# Patient Record
Sex: Male | Born: 1935 | Race: White | Hispanic: No | Marital: Married | Smoking: Former smoker
Health system: Southern US, Community
[De-identification: ages and names within clinical notes are randomized; demographics above are authoritative.]

## PROBLEM LIST (undated history)

## (undated) ENCOUNTER — Emergency Department (HOSPITAL_COMMUNITY): Admission: EM | Payer: Medicare Other | Source: Home / Self Care

## (undated) DIAGNOSIS — K219 Gastro-esophageal reflux disease without esophagitis: Secondary | ICD-10-CM

## (undated) DIAGNOSIS — I499 Cardiac arrhythmia, unspecified: Secondary | ICD-10-CM

## (undated) DIAGNOSIS — R002 Palpitations: Secondary | ICD-10-CM

## (undated) DIAGNOSIS — F329 Major depressive disorder, single episode, unspecified: Secondary | ICD-10-CM

## (undated) DIAGNOSIS — F419 Anxiety disorder, unspecified: Secondary | ICD-10-CM

## (undated) DIAGNOSIS — M1990 Unspecified osteoarthritis, unspecified site: Secondary | ICD-10-CM

## (undated) DIAGNOSIS — I4891 Unspecified atrial fibrillation: Secondary | ICD-10-CM

## (undated) DIAGNOSIS — E669 Obesity, unspecified: Secondary | ICD-10-CM

## (undated) DIAGNOSIS — F32A Depression, unspecified: Secondary | ICD-10-CM

## (undated) DIAGNOSIS — M2550 Pain in unspecified joint: Secondary | ICD-10-CM

## (undated) DIAGNOSIS — I10 Essential (primary) hypertension: Secondary | ICD-10-CM

## (undated) HISTORY — PX: OTHER SURGICAL HISTORY: SHX169

## (undated) HISTORY — DX: Palpitations: R00.2

## (undated) HISTORY — DX: Obesity, unspecified: E66.9

## (undated) HISTORY — DX: Unspecified atrial fibrillation: I48.91

## (undated) HISTORY — DX: Essential (primary) hypertension: I10

## (undated) HISTORY — PX: CATARACT EXTRACTION: SUR2

## (undated) HISTORY — DX: Gastro-esophageal reflux disease without esophagitis: K21.9

## (undated) HISTORY — DX: Anxiety disorder, unspecified: F41.9

## (undated) HISTORY — DX: Pain in unspecified joint: M25.50

---

## 1998-10-14 ENCOUNTER — Ambulatory Visit (HOSPITAL_COMMUNITY): Admission: RE | Admit: 1998-10-14 | Discharge: 1998-10-14 | Payer: Self-pay | Admitting: Urology

## 1998-10-14 ENCOUNTER — Encounter: Payer: Self-pay | Admitting: Urology

## 1999-09-09 ENCOUNTER — Encounter: Admission: RE | Admit: 1999-09-09 | Discharge: 1999-09-09 | Payer: Self-pay | Admitting: Urology

## 1999-09-09 ENCOUNTER — Encounter: Payer: Self-pay | Admitting: Urology

## 2000-10-27 ENCOUNTER — Encounter: Payer: Self-pay | Admitting: Urology

## 2000-10-27 ENCOUNTER — Encounter: Admission: RE | Admit: 2000-10-27 | Discharge: 2000-10-27 | Payer: Self-pay | Admitting: Urology

## 2002-03-29 ENCOUNTER — Ambulatory Visit (HOSPITAL_COMMUNITY): Admission: RE | Admit: 2002-03-29 | Discharge: 2002-03-29 | Payer: Self-pay | Admitting: Internal Medicine

## 2002-03-29 HISTORY — PX: ESOPHAGOGASTRODUODENOSCOPY: SHX1529

## 2008-01-31 ENCOUNTER — Encounter: Payer: Self-pay | Admitting: Family Medicine

## 2009-02-11 ENCOUNTER — Ambulatory Visit: Payer: Self-pay | Admitting: Family Medicine

## 2009-02-11 DIAGNOSIS — L851 Acquired keratosis [keratoderma] palmaris et plantaris: Secondary | ICD-10-CM | POA: Insufficient documentation

## 2009-02-11 DIAGNOSIS — I10 Essential (primary) hypertension: Secondary | ICD-10-CM | POA: Insufficient documentation

## 2009-02-11 DIAGNOSIS — F411 Generalized anxiety disorder: Secondary | ICD-10-CM | POA: Insufficient documentation

## 2009-02-12 LAB — CONVERTED CEMR LAB
BUN: 16 mg/dL (ref 6–23)
CO2: 35 meq/L — ABNORMAL HIGH (ref 19–32)
Calcium: 9.9 mg/dL (ref 8.4–10.5)
Creatinine, Ser: 1.2 mg/dL (ref 0.4–1.5)
Glucose, Bld: 118 mg/dL — ABNORMAL HIGH (ref 70–99)
Sodium: 151 meq/L — ABNORMAL HIGH (ref 135–145)

## 2009-11-11 ENCOUNTER — Ambulatory Visit: Payer: Self-pay | Admitting: Family Medicine

## 2010-04-29 ENCOUNTER — Encounter: Payer: Self-pay | Admitting: Family Medicine

## 2010-05-08 ENCOUNTER — Encounter: Payer: Self-pay | Admitting: Family Medicine

## 2010-05-08 ENCOUNTER — Ambulatory Visit: Payer: Self-pay | Admitting: Family Medicine

## 2010-05-08 DIAGNOSIS — R7309 Other abnormal glucose: Secondary | ICD-10-CM | POA: Insufficient documentation

## 2010-06-20 ENCOUNTER — Encounter
Admission: RE | Admit: 2010-06-20 | Discharge: 2010-06-20 | Payer: Self-pay | Source: Home / Self Care | Attending: Cardiology | Admitting: Cardiology

## 2010-06-20 ENCOUNTER — Encounter: Payer: Self-pay | Admitting: Physician Assistant

## 2010-06-20 ENCOUNTER — Ambulatory Visit: Payer: Self-pay | Admitting: Cardiology

## 2010-06-27 ENCOUNTER — Ambulatory Visit: Payer: Self-pay | Admitting: Cardiology

## 2010-07-01 ENCOUNTER — Encounter: Payer: Self-pay | Admitting: Cardiology

## 2010-07-01 ENCOUNTER — Ambulatory Visit: Payer: Self-pay

## 2010-07-01 ENCOUNTER — Ambulatory Visit (HOSPITAL_COMMUNITY)
Admission: RE | Admit: 2010-07-01 | Discharge: 2010-07-01 | Payer: Self-pay | Source: Home / Self Care | Attending: Cardiology | Admitting: Cardiology

## 2010-07-03 ENCOUNTER — Ambulatory Visit: Payer: Self-pay | Admitting: Cardiology

## 2010-07-03 ENCOUNTER — Encounter: Payer: Self-pay | Admitting: Physician Assistant

## 2010-07-10 LAB — PROTIME-INR

## 2010-07-11 ENCOUNTER — Ambulatory Visit: Payer: Self-pay | Admitting: Cardiology

## 2010-07-18 ENCOUNTER — Ambulatory Visit: Admission: RE | Admit: 2010-07-18 | Discharge: 2010-07-18 | Payer: Self-pay | Source: Home / Self Care

## 2010-08-01 ENCOUNTER — Ambulatory Visit: Admission: RE | Admit: 2010-08-01 | Discharge: 2010-08-01 | Payer: Self-pay | Source: Home / Self Care

## 2010-08-01 LAB — CONVERTED CEMR LAB: POC INR: 1.8

## 2010-08-04 ENCOUNTER — Ambulatory Visit
Admission: RE | Admit: 2010-08-04 | Discharge: 2010-08-04 | Payer: Self-pay | Source: Home / Self Care | Attending: Family Medicine | Admitting: Family Medicine

## 2010-08-04 ENCOUNTER — Other Ambulatory Visit: Payer: Self-pay | Admitting: Family Medicine

## 2010-08-05 LAB — BASIC METABOLIC PANEL WITH GFR
BUN: 16 mg/dL (ref 6–23)
CO2: 28 meq/L (ref 19–32)
Calcium: 9.2 mg/dL (ref 8.4–10.5)
Chloride: 105 meq/L (ref 96–112)
Creatinine, Ser: 0.9 mg/dL (ref 0.4–1.5)
GFR: 85.43 mL/min
Glucose, Bld: 144 mg/dL — ABNORMAL HIGH (ref 70–99)
Potassium: 3.4 meq/L — ABNORMAL LOW (ref 3.5–5.1)
Sodium: 141 meq/L (ref 135–145)

## 2010-08-05 LAB — TSH: TSH: 0.19 u[IU]/mL — ABNORMAL LOW (ref 0.35–5.50)

## 2010-08-06 ENCOUNTER — Encounter: Payer: Self-pay | Admitting: Cardiology

## 2010-08-06 DIAGNOSIS — M2550 Pain in unspecified joint: Secondary | ICD-10-CM | POA: Insufficient documentation

## 2010-08-06 DIAGNOSIS — R002 Palpitations: Secondary | ICD-10-CM | POA: Insufficient documentation

## 2010-08-06 DIAGNOSIS — E669 Obesity, unspecified: Secondary | ICD-10-CM | POA: Insufficient documentation

## 2010-08-06 DIAGNOSIS — F419 Anxiety disorder, unspecified: Secondary | ICD-10-CM | POA: Insufficient documentation

## 2010-08-06 DIAGNOSIS — I10 Essential (primary) hypertension: Secondary | ICD-10-CM | POA: Insufficient documentation

## 2010-08-06 DIAGNOSIS — I4891 Unspecified atrial fibrillation: Secondary | ICD-10-CM | POA: Insufficient documentation

## 2010-08-12 NOTE — Assessment & Plan Note (Signed)
Summary: med check/refill/cjr   Vital Signs:  Patient profile:   77 year old male Weight:      236 pounds BMI:     31.25 Temp:     97.6 degrees F oral BP sitting:   110 / 78  (left arm) Cuff size:   large  Vitals Entered By: Sid Falcon LPN (Nov 11, 1608 8:39 AM)  Nutrition Counseling: Patient's BMI is greater than 25 and therefore counseled on weight management options. CC: med refills, Hypertension Management   History of Present Illness: Patient here to discuss the following.  Follow up hypertension. Blood pressure well controlled on Benazepril HCTZ. No side effects from medication. Denies any recent dizziness. Does have occasional palpitations and symptoms of irregular heartbeat but no chest pain. workup by cardiologist previously.  patient has history of chronic anxiety and remains on alprazolam.  On alprazolam b.i.d. and requests going back to t.i.d. No history of misuse. No alcohol use.  Recent abrasion R thumb.  Does lots of gardening.  No fever or chills.  No drainage from thumb.  Last tetanus unknown.  Hypertension History:      He denies headache, chest pain, palpitations, dyspnea with exertion, orthopnea, PND, peripheral edema, visual symptoms, neurologic problems, syncope, and side effects from treatment.        Positive major cardiovascular risk factors include male age 103 years old or older and hypertension.     Allergies (verified): 1)  Ferrous Sulfate (Ferrous Sulfate)  Past History:  Past Surgical History: Last updated: 02/11/2009 Hernia 1961  Family History: Last updated: 02/11/2009 Family History Hypertension Family history sstroke  Social History: Last updated: 02/11/2009 Retired Married Alcohol use-no Regular exercise-yes Former smoker, quit  Risk Factors: Exercise: yes (02/11/2009)  Past Medical History: Hypertension Heart murmur Chronic Anxiety.  Review of Systems  The patient denies anorexia, fever, weight loss, weight gain,  chest pain, syncope, dyspnea on exertion, peripheral edema, prolonged cough, headaches, hemoptysis, abdominal pain, melena, hematochezia, and muscle weakness.    Physical Exam  General:  Well-developed,well-nourished,in no acute distress; alert,appropriate and cooperative throughout examination Head:  Normocephalic and atraumatic without obvious abnormalities. No apparent alopecia or balding. Mouth:  Oral mucosa and oropharynx without lesions or exudates.  Teeth in good repair. Neck:  No deformities, masses, or tenderness noted. Lungs:  Normal respiratory effort, chest expands symmetrically. Lungs are clear to auscultation, no crackles or wheezes. Heart:  Normal rate and regular rhythm. S1 and S2 normal without gallop, murmur, click, rub or other extra sounds. Extremities:  no edema R thumb small abrasion dorsally distal to interphalangeal joint.  No signs of sec infection. Neurologic:  alert & oriented X3, cranial nerves II-XII intact, and gait normal.   Skin:  no rashes.   Cervical Nodes:  No lymphadenopathy noted Psych:  normally interactive, good eye contact, not anxious appearing, and not depressed appearing.     Impression & Recommendations:  Problem # 1:  HYPERTENSION (ICD-401.9) Assessment Unchanged  His updated medication list for this problem includes:    Benazepril-hydrochlorothiazide 20-12.5 Mg Tabs (Benazepril-hydrochlorothiazide) ..... Once daily  Orders: Prescription Created Electronically [phone])  Problem # 2:  ANXIETY (ICD-300.00) Assessment: Unchanged  His updated medication list for this problem includes:    Alprazolam 1 Mg Tabs (Alprazolam) ..... One tab three times a day  Problem # 3:  ABRASION, HAND (ICD-914.0) no sec infection.  Tetanus booster given.  Complete Medication List: 1)  Benazepril-hydrochlorothiazide 20-12.5 Mg Tabs (Benazepril-hydrochlorothiazide) .... Once daily 2)  Alprazolam 1  Mg Tabs (Alprazolam) .... One tab three times a day  Other  Orders: TD Toxoids IM 7 YR + (21308) Admin 1st Vaccine (65784)  Hypertension Assessment/Plan:      The patient's hypertensive risk group is category B: At least one risk factor (excluding diabetes) with no target organ damage.  Today's blood pressure is 110/78.    Patient Instructions: 1)  Please schedule a follow-up appointment in 6 months .  Prescriptions: BENAZEPRIL-HYDROCHLOROTHIAZIDE 20-12.5 MG TABS (BENAZEPRIL-HYDROCHLOROTHIAZIDE) once daily  #30 x 11   Entered and Authorized by:   Evelena Peat MD   Signed by:   Evelena Peat MD on 11/11/2009   Method used:   Electronically to        [facility]* (retail)       [address]       [place], [place]  [address]       Ph: [phone]       Fax: [phone]   RxID:   [id number] ALPRAZOLAM 1 MG TABS (ALPRAZOLAM) one tab three times a day  #90 x 5   Entered and Authorized by:   Evelena Peat MD   Signed by:   Evelena Peat MD on 11/11/2009   Method used:   Print then Give to Patient   RxID:   [id number]    Immunizations Administered:  Tetanus Vaccine:    Vaccine Type: Td    Site: left deltoid    Mfr: Sanofi Pasteur    Dose: 0.5 ml    Route: IM    Given by: Sid Falcon LPN    Exp. Date: 05/28/2011    Lot #: [id number]

## 2010-08-12 NOTE — Letter (Signed)
Summary: [facility]  [facility]   Imported By: Maryln Gottron 05/07/2010 10:36:18  _____________________________________________________________________  External Attachment:    Type:   Image     Comment:   External Document

## 2010-08-12 NOTE — Assessment & Plan Note (Signed)
Summary: Med check, refills   Vital Signs:  Patient profile:   77 year old male Weight:      246 pounds Temp:     97.7 degrees F oral BP sitting:   110 / 74  (left arm) Cuff size:   large  Vitals Entered By: Sid Falcon LPN (May 08, 2010 8:23 AM) CC: Hypertension Management CBG Result 129   History of Present Illness: Here for follow up:  Hypertension.  On Benicar HCTZ.  Compliant with meds.  No side effects.  Chronic anxiety.  On Xanax 1mg  three times a day and has taken for years . No recent falls.  No hx of misuse.  No regular ETOH use.  Hx prediabetes .  CBG 118 last year.  No signif thrist, urine freq , or weight loss. Rarely checks CBGs at home.  Usually low 100 range.  Hypertension History:      He denies headache, chest pain, palpitations, dyspnea with exertion, orthopnea, PND, peripheral edema, visual symptoms, neurologic problems, syncope, and side effects from treatment.        Positive major cardiovascular risk factors include male age 3 years old or older and hypertension.     Clinical Review Panels:  Immunizations   Last Tetanus Booster:  Td (11/11/2009)   Allergies: 1)  Ferrous Sulfate (Ferrous Sulfate)  Past History:  Past Medical History: Last updated: 11/11/2009 Hypertension Heart murmur Chronic Anxiety.  Past Surgical History: Last updated: 02/11/2009 Hernia 1961  Family History: Last updated: 02/11/2009 Family History Hypertension Family history sstroke  Social History: Last updated: 02/11/2009 Retired Married Alcohol use-no Regular exercise-yes Former smoker, quit  Risk Factors: Exercise: yes (02/11/2009) PMH-FH-SH reviewed for relevance  Review of Systems       The patient complains of decreased hearing.  The patient denies anorexia, fever, weight loss, weight gain, vision loss, chest pain, syncope, dyspnea on exertion, peripheral edema, prolonged cough, headaches, hemoptysis, abdominal pain, melena, hematochezia,  and severe indigestion/heartburn.    Physical Exam  General:  Well-developed,well-nourished,in no acute distress; alert,appropriate and cooperative throughout examination Ears:  External ear exam shows no significant lesions or deformities.  Otoscopic examination reveals clear canals, tympanic membranes are intact bilaterally without bulging, retraction, inflammation or discharge. Hearing is grossly normal bilaterally. Mouth:  Oral mucosa and oropharynx without lesions or exudates.  Teeth in good repair. Neck:  No deformities, masses, or tenderness noted. Lungs:  Normal respiratory effort, chest expands symmetrically. Lungs are clear to auscultation, no crackles or wheezes. Heart:  normal rate and regular rhythm.   Msk:  No deformity or scoliosis noted of thoracic or lumbar spine.   Extremities:  No clubbing, cyanosis, edema, or deformity noted with normal full range of motion of all joints.   Neurologic:  alert & oriented X3 and cranial nerves II-XII intact.     Impression & Recommendations:  Problem # 1:  HYPERTENSION (ICD-401.9)  His updated medication list for this problem includes:    Benazepril-hydrochlorothiazide 20-12.5 Mg Tabs (Benazepril-hydrochlorothiazide) ..... Once daily  Problem # 2:  ANXIETY (ICD-300.00)  His updated medication list for this problem includes:    Alprazolam 1 Mg Tabs (Alprazolam) ..... One tab three times a day  Problem # 3:  HYPERGLYCEMIA (ICD-790.29) Has been in "prediabetes" range but CBG today is 129 fasting.  Pt advised to work on weight loss. In 6 months will plan A1C, BMP, and Lipids.  HE will notify us if CBGS > 140 fasting.  Complete Medication List: 1)  Benazepril-hydrochlorothiazide 20-12.5  Mg Tabs (Benazepril-hydrochlorothiazide) .... Once daily 2)  Alprazolam 1 Mg Tabs (Alprazolam) .... One tab three times a day 3)  Aspirin 81 Mg Tabs (Aspirin) .... Once daily 4)  Fish Oil Maximum Strength 1200 Mg Caps (Omega-3 fatty acids) .... Once  daily 5)  Vitamin C 100 Mg Tabs (Ascorbic acid)  Other Orders: Capillary Blood Glucose/CBG (52841)  Hypertension Assessment/Plan:      The patient's hypertensive risk group is category B: At least one risk factor (excluding diabetes) with no target organ damage.  Today's blood pressure is 110/74.    Patient Instructions: 1)  It is important that you exercise reguarly at least 20 minutes 5 times a week. If you develop chest pain, have severe difficulty breathing, or feel very tired, stop exercising immediately and seek medical attention.  2)  You need to lose weight. Consider a lower calorie diet and regular exercise.  3)  Check your blood sugars regularly. If your readings are usually above:140  or below 70 you should contact our office.  4)  Please schedule a follow-up appointment in 6 months .  Prescriptions: ALPRAZOLAM 1 MG TABS (ALPRAZOLAM) one tab three times a day  #90 x 5   Entered and Authorized by:   Evelena Peat MD   Signed by:   Evelena Peat MD on 05/08/2010   Method used:   Print then Give to Patient   RxID:   [id number]    Orders Added: 1)  Est. Patient Level IV [03474] 2)  Capillary Blood Glucose/CBG [25956]

## 2010-08-14 NOTE — Letter (Signed)
Summary: [facility]  [facility]   Imported By: Claudette Laws 07/16/2010 16:45:08  _____________________________________________________________________  External Attachment:    Type:   Image     Comment:   External Document

## 2010-08-14 NOTE — Medication Information (Signed)
Summary: [facility]  Anticoagulant Therapy  Managed by: Vashti Hey RN Supervising MD: Andee Lineman MD, Michelle Piper Indication 1: Atrial Fibrillation Lab Used: [facility] [place] Site: [place] INR POC 3.2  Dietary changes: no    Health status changes: no    Bleeding/hemorrhagic complications: no    Recent/future hospitalizations: no    Any changes in medication regimen? no    Recent/future dental: no  Any missed doses?: no       Is patient compliant with meds? yes      Comments: Transfering care from [facility].  He states they have not been charging him a a co-pay.  He will call and discuss with them.  Allergies: 1)  Ferrous Sulfate (Ferrous Sulfate)  Anticoagulation Management History:      The patient is taking warfarin and comes in today for a routine follow up visit.  Positive risk factors for bleeding include an age of 27 years or older.  The bleeding index is 'intermediate risk'.  Positive CHADS2 values include History of HTN and Age > 44 years old.  Anticoagulation responsible provider: Andee Lineman MD, Michelle Piper.  INR POC: 3.2.  Cuvette Lot#: [id number].    Anticoagulation Management Assessment/Plan:      The patient's current anticoagulation dose is Warfarin sodium 1 mg tabs: Use as directed by Anticoagualtion Clinic.  The target INR is 2.0-3.0.  The next INR is due 08/01/2010.  Anticoagulation instructions were given to patient.  Results were reviewed/authorized by Vashti Hey RN.  He was notified by Vashti Hey RN.        Coagulation management information includes: 07/18/10  Had coumadin 5mg  tablet   Was on 2.5mg  4 days week and none 3 days a week  .  Current Anticoagulation Instructions: INR 3.2 Change coumadin to 1mg  tablets Take 1 tablet once daily except 1 1/2 tablets on Mondays and Thursdays Prescriptions: WARFARIN SODIUM 1 MG TABS (WARFARIN SODIUM) Use as directed by Anticoagualtion Clinic  #45 x 3   Entered by:   Vashti Hey RN   Authorized by:   Lewayne Bunting, MD, [facility]   Signed by:   Vashti Hey RN on 07/18/2010   Method used:   Electronically to        [facility]* (retail)       [address]       [place], [place]  [address]       Ph: [phone]       Fax: [phone]   RxID:   [id number]

## 2010-08-14 NOTE — Assessment & Plan Note (Signed)
Summary: not feeling well//ccm   Vital Signs:  Patient profile:   77 year old male Weight:      234 pounds Temp:     97.8 degrees F oral BP sitting:   120 / 64  (left arm) Cuff size:   large  Vitals Entered By: Sid Falcon LPN (August 04, 2010 3:44 PM)  History of Present Illness: Patient seen with generally not feeling well. He has multiple issues to discuss today.  Recently saw cardiologist apparently was noted to have irregular heart rhythm. EKG presumably showed atrial fibrillation. Echocardiogram done. Does not recall any lab work. NOrmal LV function. Started on Coumadin and tolerating well. He has lost some weight since last visit he thinks this is dietary related. No diarrhea or change of appetite.  Denies depressed mood.  No chest pain.  Right knee troubles past several weeks. Has seen orthopedists. Possible right medial meniscus tear. Known osteoarthritis. Tylenol without much relief.  He knows to avoid NSAIDS.  Hx elev blood glucose stable by recent home readings with consistent < 100.  Has lost some  weight with diet change.  Generally he feels very anxious. History of chronic anxiety. Remains on alprazolam 1 mg t.i.d.  Hypertension treated with benazepril- HCTZ. Compliant with therapy.  Hypertension History:      He denies headache, chest pain, palpitations, dyspnea with exertion, orthopnea, PND, peripheral edema, visual symptoms, neurologic problems, syncope, and side effects from treatment.        Positive major cardiovascular risk factors include male age 37 years old or older and hypertension.     Allergies: 1)  Ferrous Sulfate (Ferrous Sulfate)  Past History:  Past Medical History: Last updated: 11/11/2009 Hypertension Heart murmur Chronic Anxiety.  Past Surgical History: Last updated: 02/11/2009 Hernia 1961  Family History: Last updated: 02/11/2009 Family History Hypertension Family history sstroke  Social History: Last updated:  02/11/2009 Retired Married Alcohol use-no Regular exercise-yes Former smoker, quit  Risk Factors: Exercise: yes (02/11/2009) PMH-FH-SH reviewed for relevance  Review of Systems  The patient denies anorexia, fever, hoarseness, chest pain, syncope, dyspnea on exertion, peripheral edema, prolonged cough, headaches, hemoptysis, abdominal pain, melena, hematochezia, severe indigestion/heartburn, and hematuria.    Physical Exam  General:  Well-developed,well-nourished,in no acute distress; alert,appropriate and cooperative throughout examination Head:  normocephalic and atraumatic.   Eyes:  pupils equal, pupils round, and pupils reactive to light.   Mouth:  Oral mucosa and oropharynx without lesions or exudates.  Teeth in good repair. Neck:  No deformities, masses, or tenderness noted. Lungs:  Normal respiratory effort, chest expands symmetrically. Lungs are clear to auscultation, no crackles or wheezes. Heart:  for the most part regular rhythm. Only occasional premature beat Extremities:  no significant edema.  R knee reveal some crepitus but no warmth and no signif effusion. Neurologic:  alert & oriented X3, cranial nerves II-XII intact, and strength normal in all extremities.   Psych:  normally interactive, not depressed appearing, and moderately anxious.     Impression & Recommendations:  Problem # 1:  HYPERTENSION (ICD-401.9)  His updated medication list for this problem includes:    Benazepril-hydrochlorothiazide 20-12.5 Mg Tabs (Benazepril-hydrochlorothiazide) ..... Once daily  Problem # 2:  HYPERGLYCEMIA (ICD-790.29) hx of.  Stable by home readings.  Problem # 3:  ATRIAL FIBRILLATION (ICD-427.31) check thyroid.  He is encourage to cont f/u with cardiology.  Answered several questions he had regarding coumadin. Orders: TLB-TSH (Thyroid Stimulating Hormone) (84443-TSH) TLB-BMP (Basic Metabolic Panel-BMET) (80048-METABOL)  His updated medication list  for this problem  includes:    Aspirin 81 Mg Tabs (Aspirin) ..... Once daily    Warfarin Sodium 1 Mg Tabs (Warfarin sodium) ..... Use as directed by anticoagualtion clinic  Problem # 4:  ANXIETY (ICD-300.00)  His updated medication list for this problem includes:    Alprazolam 1 Mg Tabs (Alprazolam) ..... One tab three times a day  Complete Medication List: 1)  Benazepril-hydrochlorothiazide 20-12.5 Mg Tabs (Benazepril-hydrochlorothiazide) .... Once daily 2)  Alprazolam 1 Mg Tabs (Alprazolam) .... One tab three times a day 3)  Aspirin 81 Mg Tabs (Aspirin) .... Once daily 4)  Fish Oil Maximum Strength 1200 Mg Caps (Omega-3 fatty acids) .... Once daily 5)  Vitamin C 100 Mg Tabs (Ascorbic acid) 6)  Warfarin Sodium 1 Mg Tabs (Warfarin sodium) .... Use as directed by anticoagualtion clinic  Hypertension Assessment/Plan:      The patient's hypertensive risk group is category B: At least one risk factor (excluding diabetes) with no target organ damage.  Today's blood pressure is 120/64.    Patient Instructions: 1)  Please schedule a follow-up appointment in 3 months .    Orders Added: 1)  TLB-TSH (Thyroid Stimulating Hormone) [84443-TSH] 2)  TLB-BMP (Basic Metabolic Panel-BMET) [80048-METABOL] 3)  Est. Patient Level IV [16109]

## 2010-08-14 NOTE — Medication Information (Signed)
Summary: [facility]  Anticoagulant Therapy  Managed by: Vashti Hey RN Supervising MD: Andee Lineman MD, Michelle Piper Indication 1: Atrial Fibrillation Lab Used: [facility] [place] Site: [place] INR POC 1.8  Dietary changes: no    Health status changes: no    Bleeding/hemorrhagic complications: no    Recent/future hospitalizations: no    Any changes in medication regimen? no    Recent/future dental: no  Any missed doses?: no       Is patient compliant with meds? yes       Allergies: 1)  Ferrous Sulfate (Ferrous Sulfate)  Anticoagulation Management History:      The patient is taking warfarin and comes in today for a routine follow up visit.  Positive risk factors for bleeding include an age of 75 years or older.  The bleeding index is 'intermediate risk'.  Positive CHADS2 values include History of HTN and Age > 75 years old.  Anticoagulation responsible provider: Andee Lineman MD, Michelle Piper.  INR POC: 1.8.  Cuvette Lot#: [id number].    Anticoagulation Management Assessment/Plan:      The patient's current anticoagulation dose is Warfarin sodium 1 mg tabs: Use as directed by Anticoagualtion Clinic.  The target INR is 2.0-3.0.  The next INR is due 08/29/2010.  Anticoagulation instructions were given to patient.  Results were reviewed/authorized by Vashti Hey RN.  He was notified by Vashti Hey RN.         Prior Anticoagulation Instructions: INR 3.2 Change coumadin to 1mg  tablets Take 1 tablet once daily except 1 1/2 tablets on Mondays and Thursdays  Current Anticoagulation Instructions: INR 1.8 Increase coumadin to 1mg  once daily except 2mg  on Mondays and Fridays

## 2010-08-26 ENCOUNTER — Ambulatory Visit (INDEPENDENT_AMBULATORY_CARE_PROVIDER_SITE_OTHER): Payer: MEDICARE | Admitting: Family Medicine

## 2010-08-26 ENCOUNTER — Other Ambulatory Visit (INDEPENDENT_AMBULATORY_CARE_PROVIDER_SITE_OTHER): Payer: MEDICARE | Admitting: Family Medicine

## 2010-08-26 ENCOUNTER — Encounter: Payer: Self-pay | Admitting: Family Medicine

## 2010-08-26 VITALS — BP 110/74 | Temp 97.8°F | Ht 72.0 in | Wt 227.0 lb

## 2010-08-26 DIAGNOSIS — E876 Hypokalemia: Secondary | ICD-10-CM

## 2010-08-26 DIAGNOSIS — E039 Hypothyroidism, unspecified: Secondary | ICD-10-CM

## 2010-08-26 DIAGNOSIS — R05 Cough: Secondary | ICD-10-CM

## 2010-08-26 DIAGNOSIS — I4891 Unspecified atrial fibrillation: Secondary | ICD-10-CM

## 2010-08-26 DIAGNOSIS — R946 Abnormal results of thyroid function studies: Secondary | ICD-10-CM

## 2010-08-26 LAB — TSH: TSH: 0.76 u[IU]/mL (ref 0.35–5.50)

## 2010-08-26 LAB — T4, FREE: Free T4: 0.81 ng/dL (ref 0.60–1.60)

## 2010-08-26 MED ORDER — PROMETHAZINE-CODEINE 6.25-10 MG/5ML PO SYRP: 5.0000 mL | ORAL_SOLUTION | ORAL | Status: AC | PRN

## 2010-08-26 NOTE — Progress Notes (Signed)
  Subjective:    Patient ID: Randall Lara, male    DOB: [birth year], 77 y.o.   MRN: [id number]  HPI  Patient is seen for the following issues  Here for lab work. Recent diagnosis Atrial Fibrillation. Low TSH of 0.19. Here today for repeat TSH and T4. Patient on Coumadin per cardiologist for atrial fibrillation and has been rate controlled. No palpitations or chest pains. No dyspnea. Patient also had low potassium 3.4 and has increased oral potassium intake since then through foods.  2 weeks ago onset of upper respiratory symptoms. Now has mostly dry cough. Initially productive. No fever. Mucinex helps slightly. Family member had promethazine codeine cough syrup which helped. Requesting refill. Nonsmoker. Denies any pleuritic pain or hemoptysis.   Review of Systems  Constitutional: Negative for fever, chills, activity change and appetite change.  HENT: Positive for congestion and rhinorrhea. Negative for ear pain and sore throat.   Respiratory: Positive for cough. Negative for shortness of breath and wheezing.   Cardiovascular: Negative for chest pain and palpitations.  Gastrointestinal: Negative for abdominal pain.  Skin: Negative for rash.  Neurological: Negative for dizziness and syncope.       Objective:   Physical Exam  patient is alert and healthy in appearance Oropharynx is clear Eardrums normal Neck supple no adenopathy Chest clear to auscultation throughout Heart irregular rhythm rate controlled round 70  Extremities no edema       Assessment & Plan:   #1 acute bronchitis.  suspect viral origin.  refill promethazine codeine cough syrup. Followup promptly for fever #2 atrial fibrillation recently diagnosed and rate controlled on Coumadin per cardiology #3 minimally low potassium.  dietary factors discussed. #4 mildly depressed TSH. repeat T4 and TSH today. Consider thyroid uptake scan if evidence for hyperthyroidism today

## 2010-08-26 NOTE — Patient Instructions (Signed)
Follow up promptly for any fever or worsening cough. 

## 2010-08-29 ENCOUNTER — Encounter (INDEPENDENT_AMBULATORY_CARE_PROVIDER_SITE_OTHER): Payer: MEDICARE

## 2010-08-29 ENCOUNTER — Encounter: Payer: Self-pay | Admitting: Cardiology

## 2010-08-29 DIAGNOSIS — Z7901 Long term (current) use of anticoagulants: Secondary | ICD-10-CM

## 2010-08-29 DIAGNOSIS — I4891 Unspecified atrial fibrillation: Secondary | ICD-10-CM

## 2010-08-29 LAB — CONVERTED CEMR LAB: POC INR: 1.7

## 2010-09-03 NOTE — Medication Information (Signed)
Summary: [facility]/ [facility]  Anticoagulant Therapy  Managed by: Vashti Hey RN Supervising MD: Andee Lineman MD, Michelle Piper Indication 1: Atrial Fibrillation Lab Used: [facility] [place] Site: [place] INR POC 1.7  Dietary changes: no    Health status changes: no    Bleeding/hemorrhagic complications: no    Recent/future hospitalizations: no    Any changes in medication regimen? no    Recent/future dental: no  Any missed doses?: no       Is patient compliant with meds? yes       Allergies: 1)  Ferrous Sulfate (Ferrous Sulfate)  Anticoagulation Management History:      The patient is taking warfarin and comes in today for a routine follow up visit.  Positive risk factors for bleeding include an age of 75 years or older.  The bleeding index is 'intermediate risk'.  Positive CHADS2 values include History of HTN and Age > 75 years old.  Anticoagulation responsible provider: Andee Lineman MD, Michelle Piper.  INR POC: 1.7.  Cuvette Lot#: [id number].    Anticoagulation Management Assessment/Plan:      The patient's current anticoagulation dose is Warfarin sodium 1 mg tabs: Use as directed by Anticoagualtion Clinic.  The target INR is 2.0-3.0.  The next INR is due 09/19/2010.  Anticoagulation instructions were given to patient.  Results were reviewed/authorized by Vashti Hey RN.  He was notified by Vashti Hey RN.         Prior Anticoagulation Instructions: INR 1.8 Increase coumadin to 1mg  once daily except 2mg  on Mondays and Fridays  Current Anticoagulation Instructions: INR 1.7 Pt denies missing doses Increase coumadin to 1mg  once daily except 2mg  on M,W,F

## 2010-09-19 ENCOUNTER — Encounter (INDEPENDENT_AMBULATORY_CARE_PROVIDER_SITE_OTHER): Payer: MEDICARE

## 2010-09-19 ENCOUNTER — Encounter: Payer: Self-pay | Admitting: Cardiology

## 2010-09-19 DIAGNOSIS — Z7901 Long term (current) use of anticoagulants: Secondary | ICD-10-CM

## 2010-09-19 DIAGNOSIS — I4891 Unspecified atrial fibrillation: Secondary | ICD-10-CM

## 2010-09-19 LAB — CONVERTED CEMR LAB: POC INR: 2

## 2010-09-23 NOTE — Medication Information (Signed)
Summary: [id number]  Anticoagulant Therapy  Managed by: Vashti Hey RN Supervising MD: Andee Lineman MD, Michelle Piper Indication 1: Atrial Fibrillation Lab Used: [facility] [place] Site: [place] INR POC 2.0  Dietary changes: no    Health status changes: no    Bleeding/hemorrhagic complications: no    Recent/future hospitalizations: no    Any changes in medication regimen? no    Recent/future dental: no  Any missed doses?: no       Is patient compliant with meds? yes       Allergies: 1)  Ferrous Sulfate (Ferrous Sulfate)  Anticoagulation Management History:      The patient is taking warfarin and comes in today for a routine follow up visit.  Positive risk factors for bleeding include an age of 18 years or older.  The bleeding index is 'intermediate risk'.  Positive CHADS2 values include History of HTN and Age > 71 years old.  Anticoagulation responsible provider: Andee Lineman MD, Michelle Piper.  INR POC: 2.0.  Cuvette Lot#: [id number].    Anticoagulation Management Assessment/Plan:      The patient's current anticoagulation dose is Warfarin sodium 1 mg tabs: Use as directed by Anticoagualtion Clinic.  The target INR is 2.0-3.0.  The next INR is due 10/21/2010.  Anticoagulation instructions were given to patient.  Results were reviewed/authorized by Vashti Hey RN.  He was notified by Vashti Hey RN.         Prior Anticoagulation Instructions: INR 1.7 Pt denies missing doses Increase coumadin to 1mg  once daily except 2mg  on M,W,F  Current Anticoagulation Instructions: INR 2.0 Increase coumadin to 2mg  once daily except 1mg  on S,T,Th Pt only wants to come 1 x mo due to co-pay

## 2010-10-02 ENCOUNTER — Ambulatory Visit (INDEPENDENT_AMBULATORY_CARE_PROVIDER_SITE_OTHER): Payer: MEDICARE | Admitting: Cardiology

## 2010-10-02 ENCOUNTER — Encounter: Payer: Self-pay | Admitting: Cardiology

## 2010-10-02 DIAGNOSIS — I10 Essential (primary) hypertension: Secondary | ICD-10-CM

## 2010-10-02 DIAGNOSIS — R002 Palpitations: Secondary | ICD-10-CM

## 2010-10-02 DIAGNOSIS — Z79899 Other long term (current) drug therapy: Secondary | ICD-10-CM

## 2010-10-02 DIAGNOSIS — I4891 Unspecified atrial fibrillation: Secondary | ICD-10-CM

## 2010-10-02 NOTE — Assessment & Plan Note (Signed)
His resting heart rate remains in the 60-70 range without any cardiac medications to slow it down.  No new medications are indicated at this time.

## 2010-10-02 NOTE — Assessment & Plan Note (Signed)
Blood pressure is remaining stable on present dose of aBenazepril HCT

## 2010-10-02 NOTE — Assessment & Plan Note (Signed)
The patient is unaware of his heart rate except when he exercises vigorously.  He was advised to avoid extremely strenuous outdoor activities such as using a post hole digger

## 2010-10-02 NOTE — Progress Notes (Signed)
HPI:  This 77 year old married Caucasian gentleman is seen for a three-month followup office visit.  He has a history of chronic atrial fibrillation which is asymptomatic.  The atrial fibrillation was discovered at the time of her routine office visit appointment.  The duration of the atrial fibrillation is not known.  He has been on Coumadin since the discovery of his atrial fibrillation.  The Coumadin is being monitored at the [facility] in [place].  He denies any chest pain.  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  Current Outpatient Prescriptions  Medication Sig Dispense Refill  . ALPRAZolam (XANAX) 1 MG tablet Take 1 mg by mouth at bedtime as needed.        . Ascorbic Acid (VITAMIN C PO) Take by mouth daily.        . benazepril-hydrochlorthiazide (LOTENSIN HCT) 20-12.5 MG per tablet Take 1 tablet by mouth daily.        . Omega-3 Fatty Acids (FISH OIL PO) Take by mouth 2 (two) times daily.        Marland Kitchen VITAMIN D PO Take by mouth daily.        Marland Kitchen warfarin (COUMADIN) 5 MG tablet Take 5 mg by mouth daily. 5MG  1 DAY A WEEK, AND 2.5MG  6 DAYS A WEEK        Allergies  Allergen Reactions  . Ferrous Sulfate     REACTION: swelling, hives    Patient Active Problem List  Diagnoses  . ANXIETY  . HYPERTENSION  . HYPERGLYCEMIA  . Atrial fibrillation  . Joint pain  . Obesity  . Hypertension  . Anxiety  . Heart palpitations    History  Smoking status  . Former Smoker -- 1.0 packs/day for 20 years  . Types: Cigarettes  . Quit date: 08/07/1975  Smokeless tobacco  . Not on file    History  Alcohol Use No    Family History  Problem Relation Age of Onset  . Stroke Mother   . Stroke Father   . Stroke Sister     Review of Systems: The patient denies any heat or cold intolerance.  No weight gain or weight loss.  The patient denies headaches or blurry vision.  There is no cough or sputum production.  The patient denies dizziness.   There is no hematuria or hematochezia.  The patient denies any muscle aches or arthritis.  The patient denies any rash.  The patient denies frequent falling or instability.  There is no history of depression or anxiety.  All other systems were reviewed and are negative.   Physical Exam: Vital signs as recorded.  Weight is 223, down 10 pounds.  The general appearance reveals a large gentleman in no acute distress.  The skin is clear.Pupils equal and reactive.   Extraocular Movements are full.  There is no scleral icterus.  The mouth and pharynx are normal.  The neck is supple.  The carotids reveal no bruits.  The jugular venous pressure is normal.  The thyroid is not enlarged.  There is no lymphadenopathy.The chest is clear to percussion and auscultation. There are no rales or rhonchi. Expansion of the chest is symmetrical.The precordium is quiet.  The first heart sound is normal.  The second heart sound is physiologically split.  There is no murmur gallop rub or click.  There is no abnormal lift or heave.  The heart rate is irregular.The abdomen is soft and nontender. Bowel  sounds are normal. The liver and spleen are not enlarged. There Are no abdominal masses. There are no bruits.  Normal extremity.Strength is normal and symmetrical in all extremities.  There is no lateralizing weakness.  There are no sensory deficits.   Assessment / Plan:

## 2010-10-20 ENCOUNTER — Encounter: Payer: Self-pay | Admitting: Cardiology

## 2010-10-20 DIAGNOSIS — I4891 Unspecified atrial fibrillation: Secondary | ICD-10-CM

## 2010-10-20 DIAGNOSIS — Z7901 Long term (current) use of anticoagulants: Secondary | ICD-10-CM | POA: Insufficient documentation

## 2010-10-21 ENCOUNTER — Ambulatory Visit (INDEPENDENT_AMBULATORY_CARE_PROVIDER_SITE_OTHER): Payer: MEDICARE | Admitting: *Deleted

## 2010-10-21 DIAGNOSIS — I4891 Unspecified atrial fibrillation: Secondary | ICD-10-CM

## 2010-10-21 DIAGNOSIS — Z7901 Long term (current) use of anticoagulants: Secondary | ICD-10-CM

## 2010-11-06 ENCOUNTER — Encounter: Payer: Self-pay | Admitting: Family Medicine

## 2010-11-06 ENCOUNTER — Ambulatory Visit (INDEPENDENT_AMBULATORY_CARE_PROVIDER_SITE_OTHER): Payer: MEDICARE | Admitting: Family Medicine

## 2010-11-06 DIAGNOSIS — E876 Hypokalemia: Secondary | ICD-10-CM

## 2010-11-06 DIAGNOSIS — I10 Essential (primary) hypertension: Secondary | ICD-10-CM

## 2010-11-06 DIAGNOSIS — I4891 Unspecified atrial fibrillation: Secondary | ICD-10-CM

## 2010-11-06 DIAGNOSIS — R7309 Other abnormal glucose: Secondary | ICD-10-CM

## 2010-11-06 DIAGNOSIS — F411 Generalized anxiety disorder: Secondary | ICD-10-CM

## 2010-11-06 DIAGNOSIS — R739 Hyperglycemia, unspecified: Secondary | ICD-10-CM

## 2010-11-06 LAB — BASIC METABOLIC PANEL
BUN: 18 mg/dL (ref 6–23)
Calcium: 9.6 mg/dL (ref 8.4–10.5)
Creatinine, Ser: 1 mg/dL (ref 0.4–1.5)
GFR: 74 mL/min (ref >60.00)
Glucose, Bld: 114 mg/dL — ABNORMAL HIGH (ref 70–99)
Potassium: 4.8 mEq/L (ref 3.5–5.1)

## 2010-11-06 LAB — HEMOGLOBIN A1C: Hgb A1c MFr Bld: 6 % (ref 4.6–6.5)

## 2010-11-06 MED ORDER — ALPRAZOLAM 1 MG PO TABS: 1.0000 mg | ORAL_TABLET | Freq: Every evening | ORAL | Status: DC | PRN

## 2010-11-06 MED ORDER — BENAZEPRIL-HYDROCHLOROTHIAZIDE 20-12.5 MG PO TABS: 1.0000 | ORAL_TABLET | Freq: Every day | ORAL | Status: DC

## 2010-11-06 NOTE — Progress Notes (Signed)
  Subjective:    Patient ID: Randall Lara, male    DOB: 01-Jul-1930, 77 y.o.   MRN: [id number]  HPI Patient is seen for medical followup. He has history of hypertension, atrial fibrillation on Coumadin, hyperglycemia, and chronic anxiety.  Medications are reviewed. Compliant with all medications. Still occasional heart palpitations but no chest pain. No dizziness and no orthostasis.  Takes Coumadin followed by Coumadin clinic. No bleeding complications. Reviewing prior labs potassium low at 3.4. He did not take any formal potassium supplement. History of hyperglycemia. Recent fasting blood sugar here 144. Monitoring at home mostly low 100s. No symptoms of hyperglycemia. Has been prediabetic for several years. Weight is stable compared to last visit.   Review of Systems  Constitutional: Negative for fever, chills, activity change, appetite change and fatigue.  Respiratory: Negative for cough and shortness of breath.   Cardiovascular: Positive for palpitations. Negative for chest pain and leg swelling.  Gastrointestinal: Negative for abdominal pain, blood in stool and abdominal distention.  Genitourinary: Negative for frequency and hematuria.  Psychiatric/Behavioral: Negative for dysphoric mood.       Objective:   Physical Exam  Constitutional: He is oriented to person, place, and time. He appears well-developed and well-nourished. No distress.  HENT:  Right Ear: External ear normal.  Left Ear: External ear normal.  Mouth/Throat: Oropharynx is clear and moist. No oropharyngeal exudate.  Neck: Neck supple.  Cardiovascular: Normal rate.        Irregularly irregular rhythm rate controlled  Pulmonary/Chest: Effort normal and breath sounds normal. No respiratory distress. He has no wheezes. He has no rales.  Musculoskeletal: He exhibits no edema.  Lymphadenopathy:    He has no cervical adenopathy.  Neurological: He is alert and oriented to person, place, and time.  Psychiatric: He has a  normal mood and affect.          Assessment & Plan:  #1 atrial fibrillation rate controlled continue INR through Coumadin clinic #2 hypertension well controlled refill benazepril HCTZ for one year #3 history of mild hypokalemia. Recheck basic metabolic panel. Increase dietary potassium. #4 history of chronic anxiety. Refill of alprazolam for 6 months #5 history of hyperglycemia. Only one documented prior fasting blood sugar over 126. Recheck blood sugar today along with A1c. Discussed diet and exercise.

## 2010-11-07 NOTE — Progress Notes (Signed)
Quick Note:  Called pt; pt aware of lab results ______

## 2010-11-18 ENCOUNTER — Ambulatory Visit (INDEPENDENT_AMBULATORY_CARE_PROVIDER_SITE_OTHER): Payer: MEDICARE | Admitting: *Deleted

## 2010-11-18 DIAGNOSIS — Z7901 Long term (current) use of anticoagulants: Secondary | ICD-10-CM

## 2010-11-18 DIAGNOSIS — I4891 Unspecified atrial fibrillation: Secondary | ICD-10-CM

## 2010-11-18 MED ORDER — WARFARIN SODIUM 1 MG PO TABS: 1.0000 mg | ORAL_TABLET | ORAL | Status: DC

## 2010-11-28 NOTE — H&P (Signed)
NAME:  Randall Lara, Randall Lara NO.:  [id number]   MEDICAL RECORD NO.:  [id number]                  PATIENT TYPE:   LOCATION:                                       FACILITY:   PHYSICIAN:  Gerrit Friends. Rourk, M.D.               DATE OF BIRTH:  04/23/1930   DATE OF ADMISSION:  03/27/2002  DATE OF DISCHARGE:                                HISTORY & PHYSICAL   CHIEF COMPLAINT:  Base of neck closing up, reflux symptoms.   HISTORY OF PRESENT ILLNESS:  The patient is a 77 year old gentleman who  comes to see me today complaining of feeling the base of his neck closing up  and food will not go in, and he has head some typical reflux symptoms over  the past one year or so.  He attributes that to drinking a glass of wine or  beer every day, which he did for overall health benefit.  After talking with  his wife, he decided to stop drinking alcohol entirely and his reflux  symptoms have improved, but he still has a sensation of the base of his neck  closing off.  He does not have any typical symptoms of esophageal dysphagia.  He really does not have much in the way of oropharyngeal component.  He has  not had any early satiety, no nausea or vomiting, no melena, no rectal  bleeding.  He has gained 11 pounds since I last saw him on 06/28/96.  I saw  this gentleman previously for elevated liver function studies.  Last liver  assay revealed on 06/23/96 revealed normal LFTS, ferritin was normal at 204.  His LFTs have been noted to be elevated for years off and on.  He has been  chronically overweight and has carried extra weight most of his life.  He  was having some right upper quadrant abdominal pain back in 1996, for which  I did a HIDA scan.  He had a gallbladder EF of 22%.  The right upper  quadrant symptoms settled down without undergoing cholecystectomy.  Prior  ultrasound of the right upper quadrant demonstrated a normal-appearing  hepatobiliary tree.  He tells me also  since being seen by me, he saw Windy Fast  L. Earlene Plater, M.D., for his prostate problems last year, and he checked out  okay.  He was also referred to Petra Kuba, M.D., and underwent a  screening colonoscopy.  This was reportedly a negative study.  Past  surgeries include bilateral inguinal hernia surgery.   I performed an EGD on this gentleman in 1996 for right upper quadrant  abdominal pain.  This was a normal study.   MEDICATIONS:  Xanax 0.5 mg p.r.n.   ALLERGIES:  No known drug allergies.   FAMILY HISTORY:  A brother had clinical cancer of the mouth.  Mother  succumbed to CVA.  Father succumbed to CVA.  SOCIAL HISTORY:  The patient is married for 46 years, has one child.  Retired from [employer].  No tobacco, alcohol as above.   REVIEW OF SYMPTOMS:  No chest pain, no dyspnea.  No fevers, chills.   PHYSICAL EXAMINATION:  GENERAL:  A robust-appearing 77 year old gentleman.  VITAL SIGNS:  Weight 240.5, BP 130/82, pulse 68.  SKIN:  Warm and dry, no jaundice.  HEENT:  No scleral icterus.  NECK:  JVD is not prominent.  CHEST:  Lungs are clear to auscultation.  CARDIAC:  Regular rate and rhythm without murmur, gallop, or rub.  ABDOMEN:  Nondistended, positive bowel sounds, soft, nontender, without  appreciable mass or organomegaly.  EXTREMITIES:  No edema.   IMPRESSION:  The patient is a pleasant 77 year old gentleman with a one-year  history of recent reflux symptoms at least temporally related to daily  alcohol consumption (low-dose) and subjective feeling that food is not going  down like it should.   It is reassuring that he had a normal EGD in 1996.  I really feel the best  thing to do at his age with his symptoms is repeat the EGD now.  I discussed  this approach with the patient.  The potential risks and benefits and  alternatives have been discussed and any questions answered.  He is  agreeable.  I feel he is at low risk for conscious sedation with intravenous   Versed and Demerol.  He did fine with 2 mg of Versed and 50 mg of Demerol  during his prior EGD at [facility].  I will plan to perform this  study in the near future and will make further recommendations.                                               Gerrit Friends. Rourk, M.D.    RMR/MEDQ  D:  03/27/2002  T:  03/27/2002  Job:  [id number]   cc:   Elvina Sidle, M.D.

## 2010-11-28 NOTE — Op Note (Signed)
NAME:  Randall Lara, Randall Lara                            ACCOUNT NO.:  [id number]   MEDICAL RECORD NO.:  [id number]                   PATIENT TYPE:  AMB   LOCATION:  DAY                                  FACILITY:  [facility]   PHYSICIAN:  Gerrit Friends. Rourk, M.D.               DATE OF BIRTH:  [birth year]   DATE OF PROCEDURE:  03/29/2002  DATE OF DISCHARGE:                                  PROCEDURE NOTE   ESOPHAGOGASTRODUODENOSCOPY  REPORT   INDICATIONS FOR PROCEDURE:  The patient is a 77 year old gentleman with a  sensation that the base of his neck is closing up where his food will not go  down from time to time.  He has some typical reflux symptoms over the past  one year or so.  He has been taking alcohol in one form or the other daily  for medicinal purposes.  He stopped doing that recently and his reflux  symptoms have improved, and he was sent to be seen in my office on March 27, 2002.  The sensation he has in the base of his neck has improved also.  He is not on acid suppressive therapy.  EGD is now being done to further  evaluate his symptoms.  The procedure has been discussed with the patient at  the bedside previously.  Potential risks, benefits, and alternatives have  been reviewed and questions answered and he is agreeable.  Please see my  dictated consultation note for more information.   DESCRIPTION OF PROCEDURE:  MONITORING:  O2 saturation, blood pressure,  pulse, and respirations were monitored the entire procedure.  CONSCIOUS SEDATION:  Versed 3 mg IV, Demerol 75 mg IV in divided doses.  INSTRUMENT:  Olympus video esophagogastroscope.  FINDINGS:  Examination of the hypopharynx revealed no abnormalities.  The  cricopharyngeus was easily traversed.  Examination of the tuboesophagus  revealed no mucosal abnormalities.  EG junction was easily traversed.  The  anterior stomach and gastric cavity was entered, insufflated well with air.  The examination of the gastric mucosa including  the retroflexed view of the  proximal stomach and esophagogastric junction demonstrated no abnormalities.  Pyloric sphincter easily traversed.  The descending duodenum, bulb, and  second portion appeared normal.   POP/DIAGNOSTIC MANEUVERS PERFORMED:  None.   The patient tolerated the procedure and was reactive after endoscopy.   IMPRESSION:  Normal esophagus, stomach, and duodenum through the second  portion.   I expect the patient has an element of a globus setting in a background  setting of gastroesophageal reflux disease.   RECOMMENDATIONS:  1. Agree with curtaining alcohol consumption.  2.     Antireflux measures/literature provided to the patient.  3. Course of Nexium 40 mg orally daily 30 minutes before breakfast.  4. Recheck in the office in six weeks.  Gerrit Friends. Rourk, M.D.    RMR/MEDQ  D:  03/29/2002  T:  03/30/2002  Job:  [id number]   cc:   Elvina Sidle, M.D.

## 2010-12-15 ENCOUNTER — Telehealth: Payer: Self-pay | Admitting: *Deleted

## 2010-12-15 MED ORDER — ALPRAZOLAM 1 MG PO TABS: 1.0000 mg | ORAL_TABLET | Freq: Three times a day (TID) | ORAL | Status: DC | PRN

## 2010-12-15 NOTE — Telephone Encounter (Signed)
Pt here reporting he used to take alprazolam 3 times a day.  Last Rx was for one tab at bedtime as needed.  He had 2 bottles with sig of 3 tabs daily prn.  This must have been pre-loaded incorrectly.  Will call in #90, one tab tid.

## 2010-12-16 ENCOUNTER — Ambulatory Visit (INDEPENDENT_AMBULATORY_CARE_PROVIDER_SITE_OTHER): Payer: Medicare Other | Admitting: *Deleted

## 2010-12-16 DIAGNOSIS — Z7901 Long term (current) use of anticoagulants: Secondary | ICD-10-CM

## 2010-12-16 DIAGNOSIS — I4891 Unspecified atrial fibrillation: Secondary | ICD-10-CM

## 2010-12-23 ENCOUNTER — Encounter: Payer: Self-pay | Admitting: Cardiology

## 2011-01-16 ENCOUNTER — Ambulatory Visit (INDEPENDENT_AMBULATORY_CARE_PROVIDER_SITE_OTHER): Payer: Medicare Other | Admitting: *Deleted

## 2011-01-16 DIAGNOSIS — Z7901 Long term (current) use of anticoagulants: Secondary | ICD-10-CM

## 2011-01-16 DIAGNOSIS — I4891 Unspecified atrial fibrillation: Secondary | ICD-10-CM

## 2011-02-05 ENCOUNTER — Telehealth: Payer: Self-pay | Admitting: *Deleted

## 2011-02-05 MED ORDER — ALPRAZOLAM 1 MG PO TABS: 1.0000 mg | ORAL_TABLET | Freq: Three times a day (TID) | ORAL | Status: DC | PRN

## 2011-02-05 NOTE — Telephone Encounter (Signed)
Pt all upset because we are not filling his Xanax for 6 months at a time.  Last refill was 6-4, pt takes 1 tab tid, #90 with 1 refill.  Pt was last seen in April.  Pt requesting enough to last him until his October appt.  I explained to him we will call in an additional #90 with 1 refill and see him in October. FYI

## 2011-02-13 ENCOUNTER — Ambulatory Visit (INDEPENDENT_AMBULATORY_CARE_PROVIDER_SITE_OTHER): Payer: Medicare Other | Admitting: *Deleted

## 2011-02-13 DIAGNOSIS — Z7901 Long term (current) use of anticoagulants: Secondary | ICD-10-CM

## 2011-02-13 DIAGNOSIS — I4891 Unspecified atrial fibrillation: Secondary | ICD-10-CM

## 2011-02-25 ENCOUNTER — Encounter: Payer: Self-pay | Admitting: Cardiology

## 2011-03-09 ENCOUNTER — Ambulatory Visit (INDEPENDENT_AMBULATORY_CARE_PROVIDER_SITE_OTHER): Payer: Medicare Other | Admitting: Cardiology

## 2011-03-09 ENCOUNTER — Encounter: Payer: Self-pay | Admitting: Cardiology

## 2011-03-09 VITALS — BP 110/70 | HR 76 | Wt 230.0 lb

## 2011-03-09 DIAGNOSIS — R002 Palpitations: Secondary | ICD-10-CM

## 2011-03-09 DIAGNOSIS — R059 Cough, unspecified: Secondary | ICD-10-CM

## 2011-03-09 DIAGNOSIS — I119 Hypertensive heart disease without heart failure: Secondary | ICD-10-CM

## 2011-03-09 DIAGNOSIS — I4891 Unspecified atrial fibrillation: Secondary | ICD-10-CM

## 2011-03-09 DIAGNOSIS — I10 Essential (primary) hypertension: Secondary | ICD-10-CM

## 2011-03-09 DIAGNOSIS — R05 Cough: Secondary | ICD-10-CM

## 2011-03-09 MED ORDER — HYDROCOD POLST-CHLORPHEN POLST 10-8 MG/5ML PO LQCR: 5.0000 mL | Freq: Two times a day (BID) | ORAL | Status: DC | PRN

## 2011-03-09 NOTE — Progress Notes (Signed)
Molli Knock Date of Birth:  Feb 10, 1930 [facility] [address]   [address] [place], [place]  [address] [phone]           Fax   [phone]  HPI: This pleasant 77 year old gentleman is seen for a scheduled followup office visit.  He has a history of chronic atrial fibrillation which is asymptomatic.  His atrial fibrillation was discovered at the time of his routine office visit appointment.  He has been on Coumadin since we discovered his atrial fib.  He has not been experiencing any new cardiac symptoms.  His appetite is good and his weight is up 7 pounds since last visit.  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  Current Outpatient Prescriptions  Medication Sig Dispense Refill  . ALPRAZolam (XANAX) 1 MG tablet Take 1 tablet (1 mg total) by mouth 3 (three) times daily as needed.  90 tablet  1  . Ascorbic Acid (VITAMIN C PO) Take by mouth daily.        . benazepril-hydrochlorthiazide (LOTENSIN HCT) 20-12.5 MG per tablet Take 1 tablet by mouth daily.  90 tablet  3  . Omega-3 Fatty Acids (FISH OIL PO) Take by mouth 2 (two) times daily.        Marland Kitchen VITAMIN D PO Take by mouth daily.        Marland Kitchen warfarin (COUMADIN) 1 MG tablet Take 1 tablet (1 mg total) by mouth as directed.  45 tablet  6    Allergies  Allergen Reactions  . Ferrous Sulfate     REACTION: swelling, hives    Patient Active Problem List  Diagnoses  . ANXIETY  . HYPERTENSION  . HYPERGLYCEMIA  . Atrial fibrillation  . Joint pain  . Obesity  . Heart palpitations  . Encounter for long-term (current) use of anticoagulants    History  Smoking status  . Former Smoker -- 1.0 packs/day for 20 years  . Types: Cigarettes  . Quit date: 08/07/1975  Smokeless tobacco  . Not on file    History  Alcohol Use No    Family History  Problem Relation Age of Onset  . Stroke Mother   . Stroke Father   . Stroke Sister     Review of  Systems: The patient denies any heat or cold intolerance.  No weight gain or weight loss.  The patient denies headaches or blurry vision.  There is no cough or sputum production.  The patient denies dizziness.  There is no hematuria or hematochezia.  The patient denies any muscle aches or arthritis.  The patient denies any rash.  The patient denies frequent falling or instability.  There is no history of depression or anxiety.  All other systems were reviewed and are negative.   Physical Exam: Filed Vitals:   03/09/11 0926  BP: 110/70  Pulse: 76   The general appearance feels a well-developed heavyset gentleman in no distress.Pupils equal and reactive.   Extraocular Movements are full.  There is no scleral icterus.  The mouth and pharynx are normal.  The neck is supple.  The carotids reveal no bruits.  The jugular venous pressure is normal.  The thyroid is not enlarged.  There is no lymphadenopathy.  The chest is clear to percussion and auscultation. There are no rales or rhonchi. Expansion of the chest is symmetrical.  The precordium is quiet.  The first heart sound is normal.  The second heart sound is physiologically split.  There is no murmur gallop rub or click.  There is no abnormal lift or heave.The rhythm is irregular in atrial fibrillation. The abdomen is soft and nontender. Bowel sounds are normal. The liver and spleen are not enlarged. There Are no abdominal masses. There are no bruits.  The pedal pulses are good.  There is no phlebitis or edema.  There is no cyanosis or clubbing.  Strength is normal and symmetrical in all extremities.  There is no lateralizing weakness.  There are no sensory deficits.  The skin is warm and dry.  There is no rash.  EKG shows atrial fibrillation with a controlled ventricular response and nonspecific T-wave flattening.     Assessment / Plan:  Continue same medication.  Recheck in 6 months.

## 2011-03-09 NOTE — Assessment & Plan Note (Signed)
The patient has had no thromboembolic symptoms referable to his atrial fibrillation.  He's not having any complications from the Coumadin anticoagulation.

## 2011-03-09 NOTE — Assessment & Plan Note (Signed)
Patient has a past history of hypertension.  He has been on chronic benazepril/hydrochlorothiazide.  His not having any symptoms of congestive heart failure.

## 2011-03-10 ENCOUNTER — Encounter: Payer: Self-pay | Admitting: Cardiology

## 2011-03-13 ENCOUNTER — Ambulatory Visit (INDEPENDENT_AMBULATORY_CARE_PROVIDER_SITE_OTHER): Payer: Medicare Other | Admitting: *Deleted

## 2011-03-13 DIAGNOSIS — I4891 Unspecified atrial fibrillation: Secondary | ICD-10-CM

## 2011-03-13 DIAGNOSIS — Z7901 Long term (current) use of anticoagulants: Secondary | ICD-10-CM

## 2011-04-10 ENCOUNTER — Ambulatory Visit (INDEPENDENT_AMBULATORY_CARE_PROVIDER_SITE_OTHER): Payer: Medicare Other | Admitting: *Deleted

## 2011-04-10 DIAGNOSIS — I4891 Unspecified atrial fibrillation: Secondary | ICD-10-CM

## 2011-04-10 DIAGNOSIS — Z7901 Long term (current) use of anticoagulants: Secondary | ICD-10-CM

## 2011-04-10 LAB — POCT INR: INR: 2.2

## 2011-04-16 ENCOUNTER — Encounter: Payer: Self-pay | Admitting: Cardiology

## 2011-05-08 ENCOUNTER — Ambulatory Visit (INDEPENDENT_AMBULATORY_CARE_PROVIDER_SITE_OTHER): Payer: Medicare Other | Admitting: Family Medicine

## 2011-05-08 ENCOUNTER — Encounter: Payer: Self-pay | Admitting: Family Medicine

## 2011-05-08 VITALS — BP 110/62 | Temp 97.8°F | Wt 230.0 lb

## 2011-05-08 DIAGNOSIS — M545 Low back pain, unspecified: Secondary | ICD-10-CM

## 2011-05-08 DIAGNOSIS — Z23 Encounter for immunization: Secondary | ICD-10-CM

## 2011-05-08 DIAGNOSIS — I10 Essential (primary) hypertension: Secondary | ICD-10-CM

## 2011-05-08 DIAGNOSIS — F411 Generalized anxiety disorder: Secondary | ICD-10-CM

## 2011-05-08 MED ORDER — HYDROCODONE-ACETAMINOPHEN 5-500 MG PO TABS: 1.0000 | ORAL_TABLET | Freq: Four times a day (QID) | ORAL | Status: AC | PRN

## 2011-05-08 MED ORDER — ALPRAZOLAM 1 MG PO TABS: 1.0000 mg | ORAL_TABLET | Freq: Three times a day (TID) | ORAL | Status: DC | PRN

## 2011-05-08 NOTE — Progress Notes (Signed)
  Subjective:    Patient ID: Randall Lara, male    DOB: 07/01/1930, 77 y.o.   MRN: [id number]  HPI  Medical followup. Patient has long history of chronic anxiety. He has been maintained on alprazolam for several years. No history of misuse. Needs refills. No history of any gait problems or unsteadiness though he did slip in his bathroom and fell October 4. Fell against the commode. Persistent right lower lumbar pain with bruising. Went to emergency room in [facility] and CT scan reportedly no fracture. Overall pain slightly improved. No radiculopathy symptoms. Location is right lower lumbar. Moderate severity. Takes low-dose Coumadin for atrial fibrillation. Cannot take nonsteroidals. Prescribed low-dose Vicodin and requesting one refill. Mostly for severe night pain.  Hypertension treated Lotensin HCTZ. Blood pressure well-controlled. No dizziness. No headaches. Denies chest pain. Needs flu vaccine  Past Medical History  Diagnosis Date  . Atrial fibrillation   . Joint pain   . Obesity   . Hypertension   . Anxiety   . Heart palpitations    No past surgical history on file.  reports that he quit smoking about 35 years ago. His smoking use included Cigarettes. He has a 20 pack-year smoking history. He does not have any smokeless tobacco history on file. He reports that he does not drink alcohol or use illicit drugs. family history includes Stroke in his father, mother, and sister. Allergies  Allergen Reactions  . Ferrous Sulfate     REACTION: swelling, hives      Review of Systems  Constitutional: Negative for fever and chills.  Respiratory: Negative for cough and shortness of breath.   Cardiovascular: Negative for chest pain and palpitations.  Gastrointestinal: Negative for abdominal pain and blood in stool.  Genitourinary: Negative for hematuria.  Psychiatric/Behavioral: Negative for dysphoric mood. The patient is nervous/anxious.        Objective:   Physical Exam    Constitutional: He appears well-developed and well-nourished.  Neck: Neck supple. No thyromegaly present.  Cardiovascular: Normal rate.   Pulmonary/Chest: Effort normal and breath sounds normal. No respiratory distress. He has no wheezes. He has no rales.  Musculoskeletal:       Slightly tender right lower lumbar region. No visible swelling. No ecchymosis. No spinal tenderness. Straight leg raise is negative.          Assessment & Plan:  #1 right lower lumbar pain. Suspect contusion. One refill Vicodin 5/500 one every 6 hours as needed #20 with no refill. Cautioned about possible constipation. #2 chronic anxiety. Refill presently in for 6 months #3 hypertension stable continue current medication  #4 history atrial fibrillation. Rate control. Coumadin followed through Coumadin clinic  #5 health maintenance. Flu vaccine given

## 2011-05-12 ENCOUNTER — Ambulatory Visit (INDEPENDENT_AMBULATORY_CARE_PROVIDER_SITE_OTHER): Payer: Medicare Other | Admitting: *Deleted

## 2011-05-12 DIAGNOSIS — Z7901 Long term (current) use of anticoagulants: Secondary | ICD-10-CM

## 2011-05-12 DIAGNOSIS — I4891 Unspecified atrial fibrillation: Secondary | ICD-10-CM

## 2011-06-23 ENCOUNTER — Ambulatory Visit (INDEPENDENT_AMBULATORY_CARE_PROVIDER_SITE_OTHER): Payer: Medicare Other | Admitting: *Deleted

## 2011-06-23 DIAGNOSIS — I4891 Unspecified atrial fibrillation: Secondary | ICD-10-CM

## 2011-06-23 DIAGNOSIS — Z7901 Long term (current) use of anticoagulants: Secondary | ICD-10-CM

## 2011-06-23 MED ORDER — WARFARIN SODIUM 1 MG PO TABS: 1.0000 mg | ORAL_TABLET | ORAL | Status: DC

## 2011-08-04 ENCOUNTER — Ambulatory Visit (INDEPENDENT_AMBULATORY_CARE_PROVIDER_SITE_OTHER): Payer: Medicare Other | Admitting: *Deleted

## 2011-08-04 DIAGNOSIS — Z7901 Long term (current) use of anticoagulants: Secondary | ICD-10-CM

## 2011-08-04 DIAGNOSIS — I4891 Unspecified atrial fibrillation: Secondary | ICD-10-CM

## 2011-08-04 LAB — POCT INR: INR: 2.4

## 2011-08-18 ENCOUNTER — Ambulatory Visit (INDEPENDENT_AMBULATORY_CARE_PROVIDER_SITE_OTHER): Payer: Medicare Other | Admitting: Cardiology

## 2011-08-18 ENCOUNTER — Encounter: Payer: Self-pay | Admitting: Cardiology

## 2011-08-18 VITALS — BP 126/68 | HR 63 | Ht 72.0 in | Wt 232.4 lb

## 2011-08-18 DIAGNOSIS — I4891 Unspecified atrial fibrillation: Secondary | ICD-10-CM

## 2011-08-18 DIAGNOSIS — I119 Hypertensive heart disease without heart failure: Secondary | ICD-10-CM

## 2011-08-18 DIAGNOSIS — I10 Essential (primary) hypertension: Secondary | ICD-10-CM

## 2011-08-18 NOTE — Patient Instructions (Signed)
Your physician recommends that you continue on your current medications as directed. Please refer to the Current Medication list given to you today.  Your physician wants you to follow-up in: 6 months. You will receive a reminder letter in the mail two months in advance. If you don't receive a letter, please call our office to schedule the follow-up appointment.  

## 2011-08-18 NOTE — Progress Notes (Signed)
Molli Knock Date of Birth:  08/03/1929 [facility]  [address] [place], [place]  [address] [phone]  Fax   [phone]  HPI: This pleasant 78 year old gentleman is seen for a six-month followup office visit.  He has a history of chronic asymptomatic atrial fibrillation.  He is on Coumadin.  His Coumadin is followed at the our clinic in the.  He has not been experiencing any new cardiac symptoms.  Denies chest pain or shortness of breath.  He gets his exercise during the winter by walking at [place].  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  Current Outpatient Prescriptions  Medication Sig Dispense Refill  . ALPRAZolam (XANAX) 1 MG tablet Take 1 tablet (1 mg total) by mouth 3 (three) times daily as needed.  90 tablet  5  . Ascorbic Acid (VITAMIN C PO) Take by mouth daily.        . benazepril-hydrochlorthiazide (LOTENSIN HCT) 20-12.5 MG per tablet Take 1 tablet by mouth daily.  90 tablet  3  . chlorpheniramine-HYDROcodone (TUSSIONEX PENNKINETIC ER) 10-8 MG/5ML LQCR Take 5 mLs by mouth every 12 (twelve) hours as needed.  120 mL    . Omega-3 Fatty Acids (FISH OIL PO) Take by mouth 2 (two) times daily.        Marland Kitchen VITAMIN D PO Take by mouth daily.        Marland Kitchen warfarin (COUMADIN) 1 MG tablet Take 1 tablet (1 mg total) by mouth as directed. Take coumadin 2 tablets daily except 1 tablet on S,T,Th  60 tablet  6    Allergies  Allergen Reactions  . Ferrous Sulfate     REACTION: swelling, hives  . Sulfa Antibiotics     Reported per patient    Patient Active Problem List  Diagnoses  . ANXIETY  . HYPERTENSION  . HYPERGLYCEMIA  . Atrial fibrillation  . Joint pain  . Obesity  . Heart palpitations  . Encounter for long-term (current) use of anticoagulants    History  Smoking status  . Former Smoker -- 1.0 packs/day for 20 years  . Types: Cigarettes  . Quit date: 08/07/1975  Smokeless tobacco  . Not on file    History  Alcohol Use No    Family History    Problem Relation Age of Onset  . Stroke Mother   . Stroke Father   . Stroke Sister     Review of Systems: The patient denies any heat or cold intolerance.  No weight gain or weight loss.  The patient denies headaches or blurry vision.  There is no cough or sputum production.  The patient denies dizziness.  There is no hematuria or hematochezia.  The patient denies any muscle aches or arthritis.  The patient denies any rash.  The patient denies frequent falling or instability.  There is no history of depression or anxiety.  All other systems were reviewed and are negative.   Physical Exam: Filed Vitals:   08/18/11 0844  BP: 126/68  Pulse: 63   the general appearance reveals a well-developed well-nourished gentleman in no distress.Pupils equal and reactive.   Extraocular Movements are full.  There is no scleral icterus.  The mouth and pharynx are normal.  The neck is supple.  The carotids reveal no bruits.  The jugular venous pressure is normal.  The thyroid is not enlarged.  There is no lymphadenopathy.  The chest is clear to percussion and auscultation. There are no rales or rhonchi. Expansion of the  chest is symmetrical.  Heart reveals a soft grade 1/6 systolic ejection murmur at the base.  No diastolic murmur.  No gallop.  No rub.The abdomen is soft and nontender. Bowel sounds are normal. The liver and spleen are not enlarged. There Are no abdominal masses. There are no bruits.  Extremities no phlebitis or edemaStrength is normal and symmetrical in all extremities.  There is no lateralizing weakness.  There are no sensory deficits.  The skin is warm and dry.  There is no rash.     Assessment / Plan: Continue same medication and be rechecked in 6 months for a followup office visit and EKG.  Try to lose weight.

## 2011-08-18 NOTE — Assessment & Plan Note (Signed)
Blood pressure was remaining stable on his current dose of Lotensin HCT.  He does have chronic edema of the right leg which has persisted since previous right knee arthroscopic surgery.

## 2011-08-18 NOTE — Assessment & Plan Note (Signed)
Patient has not had any symptoms of congestive heart failure.  He denies chest pain or shortness of breath.  He said no TIA symptoms referable to his atrial fibrillation.

## 2011-09-15 ENCOUNTER — Ambulatory Visit (INDEPENDENT_AMBULATORY_CARE_PROVIDER_SITE_OTHER): Payer: Medicare Other | Admitting: *Deleted

## 2011-09-15 DIAGNOSIS — I4891 Unspecified atrial fibrillation: Secondary | ICD-10-CM

## 2011-09-15 DIAGNOSIS — Z7901 Long term (current) use of anticoagulants: Secondary | ICD-10-CM

## 2011-09-15 LAB — POCT INR: INR: 2.3

## 2011-09-30 ENCOUNTER — Telehealth: Payer: Self-pay | Admitting: Family Medicine

## 2011-09-30 NOTE — Telephone Encounter (Signed)
Pt would like to referral his brother in law(steve walker) as new pt. Pt has medicare Ins. Can I sch?

## 2011-10-01 NOTE — Telephone Encounter (Signed)
Pt is aware md will accept brother in law as new pt

## 2011-10-01 NOTE — Telephone Encounter (Signed)
yes

## 2011-10-27 ENCOUNTER — Ambulatory Visit (INDEPENDENT_AMBULATORY_CARE_PROVIDER_SITE_OTHER): Payer: Medicare Other | Admitting: *Deleted

## 2011-10-27 DIAGNOSIS — Z7901 Long term (current) use of anticoagulants: Secondary | ICD-10-CM

## 2011-10-27 DIAGNOSIS — I4891 Unspecified atrial fibrillation: Secondary | ICD-10-CM

## 2011-11-06 ENCOUNTER — Ambulatory Visit: Payer: Medicare Other | Admitting: Family Medicine

## 2011-11-12 ENCOUNTER — Encounter: Payer: Self-pay | Admitting: Family Medicine

## 2011-11-12 ENCOUNTER — Ambulatory Visit (INDEPENDENT_AMBULATORY_CARE_PROVIDER_SITE_OTHER): Payer: Medicare Other | Admitting: Family Medicine

## 2011-11-12 VITALS — BP 110/70 | Temp 97.8°F | Wt 237.0 lb

## 2011-11-12 DIAGNOSIS — I10 Essential (primary) hypertension: Secondary | ICD-10-CM

## 2011-11-12 DIAGNOSIS — F411 Generalized anxiety disorder: Secondary | ICD-10-CM

## 2011-11-12 DIAGNOSIS — I4891 Unspecified atrial fibrillation: Secondary | ICD-10-CM

## 2011-11-12 DIAGNOSIS — R7309 Other abnormal glucose: Secondary | ICD-10-CM

## 2011-11-12 LAB — BASIC METABOLIC PANEL
Chloride: 101 mEq/L (ref 96–112)
GFR: 87.37 mL/min (ref >60.00)
Potassium: 4.3 mEq/L (ref 3.5–5.1)
Sodium: 142 mEq/L (ref 135–145)

## 2011-11-12 MED ORDER — BENAZEPRIL-HYDROCHLOROTHIAZIDE 20-12.5 MG PO TABS: 1.0000 | ORAL_TABLET | Freq: Every day | ORAL | Status: DC

## 2011-11-12 MED ORDER — ALPRAZOLAM 1 MG PO TABS: 1.0000 mg | ORAL_TABLET | Freq: Three times a day (TID) | ORAL | Status: DC | PRN

## 2011-11-12 NOTE — Progress Notes (Signed)
  Subjective:    Patient ID: Randall Lara, male    DOB: 1930-03-23, 79 y.o.   MRN: [id number]  HPI  Medical followup. Patient has history of hypertension, atrial fibrillation, chronic anxiety, and hyperglycemia. Medications reviewed. Compliant with all. Coumadin being followed in Coumadin clinic elsewhere. No bleeding complications. Takes benazepril HCTZ for hypertension. Blood pressure well-controlled. No dizziness. No palpitations. No recent chest pain. Patient has been on Xanax for several years. No history of falls. No balance issues. No history of misuse. No alcohol use.  Past Medical History  Diagnosis Date  . Atrial fibrillation   . Joint pain   . Obesity   . Hypertension   . Anxiety   . Heart palpitations    No past surgical history on file.  reports that he quit smoking about 36 years ago. His smoking use included Cigarettes. He has a 20 pack-year smoking history. He does not have any smokeless tobacco history on file. He reports that he does not drink alcohol or use illicit drugs. family history includes Stroke in his father, mother, and sister. Allergies  Allergen Reactions  . Ferrous Sulfate     REACTION: swelling, hives  . Sulfa Antibiotics     Reported per patient     Review of Systems  Constitutional: Negative for fatigue.  Eyes: Negative for visual disturbance.  Respiratory: Negative for cough, chest tightness and shortness of breath.   Cardiovascular: Negative for chest pain, palpitations and leg swelling.  Gastrointestinal: Negative for abdominal pain.  Neurological: Negative for dizziness, syncope, weakness, light-headedness and headaches.  Psychiatric/Behavioral: Negative for dysphoric mood.       Objective:   Physical Exam  Constitutional: He is oriented to person, place, and time. He appears well-developed and well-nourished.  HENT:  Mouth/Throat: Oropharynx is clear and moist.  Neck: Neck supple. No thyromegaly present.  Cardiovascular: Normal rate  and regular rhythm.   Pulmonary/Chest: Effort normal and breath sounds normal. No respiratory distress. He has no wheezes. He has no rales.  Musculoskeletal: He exhibits no edema.  Lymphadenopathy:    He has no cervical adenopathy.  Neurological: He is alert and oriented to person, place, and time.          Assessment & Plan:  #1 hypertension. Stable. Refill medication for one year. Check basic metabolic panel #2 chronic anxiety. Refill Xanax 1 mg 3 times a day for 6 months  #3 hx atrial fibrillation. Appears to be in sinus rhythm today. Continue followup with Coumadin clinic. #4 history of hyperglycemia. Recheck A1c.

## 2011-11-13 ENCOUNTER — Ambulatory Visit: Payer: Medicare Other | Admitting: Family Medicine

## 2011-11-13 NOTE — Progress Notes (Signed)
Quick Note:  Pt informed on VM ______ 

## 2011-12-08 ENCOUNTER — Ambulatory Visit (INDEPENDENT_AMBULATORY_CARE_PROVIDER_SITE_OTHER): Payer: Medicare Other | Admitting: *Deleted

## 2011-12-08 DIAGNOSIS — Z7901 Long term (current) use of anticoagulants: Secondary | ICD-10-CM

## 2011-12-08 DIAGNOSIS — I4891 Unspecified atrial fibrillation: Secondary | ICD-10-CM

## 2012-01-19 ENCOUNTER — Ambulatory Visit (INDEPENDENT_AMBULATORY_CARE_PROVIDER_SITE_OTHER): Payer: Medicare Other | Admitting: *Deleted

## 2012-01-19 DIAGNOSIS — I4891 Unspecified atrial fibrillation: Secondary | ICD-10-CM

## 2012-01-19 DIAGNOSIS — Z7901 Long term (current) use of anticoagulants: Secondary | ICD-10-CM

## 2012-01-19 LAB — POCT INR: INR: 2.8

## 2012-02-17 ENCOUNTER — Ambulatory Visit (INDEPENDENT_AMBULATORY_CARE_PROVIDER_SITE_OTHER): Payer: Medicare Other | Admitting: Cardiology

## 2012-02-17 ENCOUNTER — Encounter: Payer: Self-pay | Admitting: Cardiology

## 2012-02-17 VITALS — BP 110/74 | HR 63 | Ht 72.0 in | Wt 239.0 lb

## 2012-02-17 DIAGNOSIS — I10 Essential (primary) hypertension: Secondary | ICD-10-CM

## 2012-02-17 DIAGNOSIS — I4891 Unspecified atrial fibrillation: Secondary | ICD-10-CM

## 2012-02-17 DIAGNOSIS — I119 Hypertensive heart disease without heart failure: Secondary | ICD-10-CM

## 2012-02-17 NOTE — Assessment & Plan Note (Signed)
Blood pressure has been staying satisfactory on current therapy.  No headaches or dizzy spells.  Stays physically active.

## 2012-02-17 NOTE — Patient Instructions (Signed)
STOP OMEGA 3 FISH OIL   Your physician wants you to follow-up in: 6 months You will receive a reminder letter in the mail two months in advance. If you don't receive a letter, please call our office to schedule the follow-up appointment.

## 2012-02-17 NOTE — Progress Notes (Signed)
Molli Knock Date of Birth:  21-Aug-1929 [facility] [address] [place], [place]  [address] [phone]  Fax   [phone]  HPI: This pleasant 78 year old gentleman is seen for a six-month followup office visit.  He has a history of established chronic asymptomatic atrial fibrillation.  He is on Coumadin which is followed at the clinic in the [place].  He has had no thromboembolic events.  He denies any chest pain or shortness of breath.  Current Outpatient Prescriptions  Medication Sig Dispense Refill  . ALPRAZolam (XANAX) 1 MG tablet Take 1 tablet (1 mg total) by mouth 3 (three) times daily as needed.  90 tablet  5  . Ascorbic Acid (VITAMIN C PO) Take by mouth daily.        . benazepril-hydrochlorthiazide (LOTENSIN HCT) 20-12.5 MG per tablet Take 1 tablet by mouth daily.  90 tablet  3  . VITAMIN D PO Take by mouth daily.        Marland Kitchen warfarin (COUMADIN) 1 MG tablet Take 1 tablet (1 mg total) by mouth as directed. Take coumadin 2 tablets daily except 1 tablet on S,T,Th  60 tablet  6    Allergies  Allergen Reactions  . Ferrous Sulfate     REACTION: swelling, hives  . Sulfa Antibiotics     Reported per patient    Patient Active Problem List  Diagnosis  . ANXIETY  . HYPERTENSION  . HYPERGLYCEMIA  . Atrial fibrillation  . Joint pain  . Obesity  . Heart palpitations  . Encounter for long-term (current) use of anticoagulants    History  Smoking status  . Former Smoker -- 1.0 packs/day for 20 years  . Types: Cigarettes  . Quit date: 08/07/1975  Smokeless tobacco  . Not on file    History  Alcohol Use No    Family History  Problem Relation Age of Onset  . Stroke Mother   . Stroke Father   . Stroke Sister     Review of Systems: The patient denies any heat or cold intolerance.  No weight gain or weight loss.  The patient denies headaches or blurry vision.  There is no cough or sputum production.  The patient denies dizziness.  There is no  hematuria or hematochezia.  The patient denies any muscle aches or arthritis.  The patient denies any rash.  The patient denies frequent falling or instability.  There is no history of depression or anxiety.  All other systems were reviewed and are negative.   Physical Exam: Filed Vitals:   02/17/12 1001  BP: 110/74  Pulse: 63   the general appearance reveals an elderly gentleman in no acute distress.The head and neck exam reveals pupils equal and reactive.  Extraocular movements are full.  There is no scleral icterus.  The mouth and pharynx are normal.  The neck is supple.  The carotids reveal no bruits.  The jugular venous pressure is normal.  The  thyroid is not enlarged.  There is no lymphadenopathy.  The chest is clear to percussion and auscultation.  There are no rales or rhonchi.  Expansion of the chest is symmetrical.  The precordium is quiet.  The first heart sound is normal.  The second heart sound is physiologically split.  There is no murmur gallop rub or click.  There is no abnormal lift or heave. The pulse is slow and irregular in atrial fibrillation.  The abdomen is soft and nontender.  The bowel sounds are normal.  The liver and spleen are not enlarged.  There are no abdominal masses.  There are no abdominal bruits.  Extremities reveal good pedal pulses.  There is no phlebitis or edema.  There is no cyanosis or clubbing.  Strength is normal and symmetrical in all extremities.  There is no lateralizing weakness.  There are no sensory deficits.  The skin is warm and dry.  There is no rash.  EKG shows atrial fibrillation with a controlled ventricular response.  No ischemic changes.    Assessment / Plan:  Continue same medication.  Recheck in 6 months for followup office visit.

## 2012-02-17 NOTE — Assessment & Plan Note (Signed)
Exercise tolerance is good.  Is not having any symptoms of congestive heart failure.  No dizziness or syncope.

## 2012-03-01 ENCOUNTER — Ambulatory Visit (INDEPENDENT_AMBULATORY_CARE_PROVIDER_SITE_OTHER): Payer: Medicare Other | Admitting: *Deleted

## 2012-03-01 DIAGNOSIS — I4891 Unspecified atrial fibrillation: Secondary | ICD-10-CM

## 2012-03-01 DIAGNOSIS — Z7901 Long term (current) use of anticoagulants: Secondary | ICD-10-CM

## 2012-03-01 LAB — POCT INR: INR: 2.6

## 2012-03-31 ENCOUNTER — Other Ambulatory Visit: Payer: Self-pay | Admitting: Cardiology

## 2012-04-12 ENCOUNTER — Ambulatory Visit (INDEPENDENT_AMBULATORY_CARE_PROVIDER_SITE_OTHER): Payer: Medicare Other | Admitting: *Deleted

## 2012-04-12 DIAGNOSIS — I4891 Unspecified atrial fibrillation: Secondary | ICD-10-CM

## 2012-04-12 DIAGNOSIS — Z7901 Long term (current) use of anticoagulants: Secondary | ICD-10-CM

## 2012-04-12 LAB — POCT INR: INR: 2.7

## 2012-05-13 ENCOUNTER — Ambulatory Visit (INDEPENDENT_AMBULATORY_CARE_PROVIDER_SITE_OTHER): Payer: Medicare Other | Admitting: Family Medicine

## 2012-05-13 ENCOUNTER — Encounter: Payer: Self-pay | Admitting: Family Medicine

## 2012-05-13 VITALS — BP 108/68 | Temp 97.5°F | Wt 246.0 lb

## 2012-05-13 DIAGNOSIS — I10 Essential (primary) hypertension: Secondary | ICD-10-CM

## 2012-05-13 DIAGNOSIS — F411 Generalized anxiety disorder: Secondary | ICD-10-CM

## 2012-05-13 DIAGNOSIS — I4891 Unspecified atrial fibrillation: Secondary | ICD-10-CM

## 2012-05-13 MED ORDER — BENAZEPRIL-HYDROCHLOROTHIAZIDE 20-12.5 MG PO TABS: 1.0000 | ORAL_TABLET | Freq: Every day | ORAL | Status: DC

## 2012-05-13 MED ORDER — ALPRAZOLAM 1 MG PO TABS: 1.0000 mg | ORAL_TABLET | Freq: Three times a day (TID) | ORAL | Status: DC | PRN

## 2012-05-13 NOTE — Progress Notes (Signed)
  Subjective:    Patient ID: Randall Lara, male    DOB: 03-15-1930, 78 y.o.   MRN: [id number]  HPI  Medical followup. Patient has history of obesity, hypertension, intermittent atrial fibrillation, and chronic anxiety. He's been maintained on alprazolam 1 mg 3 times a day for several years. He has been unable to taper this because of anxiety symptoms. Has not responded to serotonin reuptake inhibitors previously. He's very steady. No recent falls.  Hypertension treated with benazepril HCTZ. No orthostasis. No chest pains. Remains on Coumadin followed through the Coumadin clinic in [facility].  Patient has already had flu vaccine. Pneumovax up-to-date. He declines shingles vaccine. He does not recall having chickenpox in childhood.  Past Medical History  Diagnosis Date  . Atrial fibrillation   . Joint pain   . Obesity   . Hypertension   . Anxiety   . Heart palpitations    No past surgical history on file.  reports that he quit smoking about 36 years ago. His smoking use included Cigarettes. He has a 20 pack-year smoking history. He does not have any smokeless tobacco history on file. He reports that he does not drink alcohol or use illicit drugs. family history includes Stroke in his father, mother, and sister. Allergies  Allergen Reactions  . Ferrous Sulfate     REACTION: swelling, hives  . Sulfa Antibiotics     Reported per patient      Review of Systems  Constitutional: Positive for fatigue.  Eyes: Negative for visual disturbance.  Respiratory: Negative for cough, chest tightness and shortness of breath.   Cardiovascular: Negative for chest pain, palpitations and leg swelling.  Neurological: Negative for dizziness, syncope, weakness, light-headedness and headaches.       Objective:   Physical Exam  Constitutional: He is oriented to person, place, and time. He appears well-developed and well-nourished.  Neck: Neck supple. No thyromegaly present.  Cardiovascular:  Normal rate and regular rhythm.   Pulmonary/Chest: Effort normal and breath sounds normal. No respiratory distress. He has no wheezes. He has no rales.  Musculoskeletal: He exhibits no edema.  Lymphadenopathy:    He has no cervical adenopathy.  Neurological: He is alert and oriented to person, place, and time.          Assessment & Plan:  #1 hypertension. Stable. Refill medication for one year. Check basic metabolic panel followup #2 chronic atrial fibrillation. Appears to be in sinus rhythm today. Continue Coumadin. INR followed elsewhere #3 history of chronic anxiety. Refill alprazolam for 6 months #4 fatigue. Possibly related to sedentary lifestyle. We discussed lab work and he wants to defer to followup. Consider basic metabolic panel, CBC, TSH at followup

## 2012-05-24 ENCOUNTER — Ambulatory Visit (INDEPENDENT_AMBULATORY_CARE_PROVIDER_SITE_OTHER): Payer: Medicare Other | Admitting: *Deleted

## 2012-05-24 DIAGNOSIS — Z7901 Long term (current) use of anticoagulants: Secondary | ICD-10-CM

## 2012-05-24 DIAGNOSIS — I4891 Unspecified atrial fibrillation: Secondary | ICD-10-CM

## 2012-07-12 ENCOUNTER — Ambulatory Visit (INDEPENDENT_AMBULATORY_CARE_PROVIDER_SITE_OTHER): Payer: Medicare Other | Admitting: *Deleted

## 2012-07-12 DIAGNOSIS — I4891 Unspecified atrial fibrillation: Secondary | ICD-10-CM

## 2012-07-12 DIAGNOSIS — Z7901 Long term (current) use of anticoagulants: Secondary | ICD-10-CM

## 2012-07-12 LAB — POCT INR: INR: 2.5

## 2012-07-12 MED ORDER — WARFARIN SODIUM 1 MG PO TABS: ORAL_TABLET | ORAL | Status: DC

## 2012-08-19 ENCOUNTER — Ambulatory Visit (INDEPENDENT_AMBULATORY_CARE_PROVIDER_SITE_OTHER): Payer: Medicare Other | Admitting: Cardiology

## 2012-08-19 ENCOUNTER — Encounter: Payer: Self-pay | Admitting: Cardiology

## 2012-08-19 VITALS — BP 130/70 | HR 64 | Resp 18 | Ht 72.0 in | Wt 234.0 lb

## 2012-08-19 DIAGNOSIS — I10 Essential (primary) hypertension: Secondary | ICD-10-CM

## 2012-08-19 DIAGNOSIS — I4891 Unspecified atrial fibrillation: Secondary | ICD-10-CM

## 2012-08-19 NOTE — Patient Instructions (Addendum)
Your physician recommends that you continue on your current medications as directed. Please refer to the Current Medication list given to you today.  Your physician wants you to follow-up in: 1 year. You will receive a reminder letter in the mail two months in advance. If you don't receive a letter, please call our office to schedule the follow-up appointment.  

## 2012-08-19 NOTE — Progress Notes (Signed)
Molli Knock Date of Birth:  Aug 30, 1929 [facility] [address] [place], [place]  [address] [phone]  Fax   [phone]  HPI: This pleasant 79 year old gentleman is seen for a six-month followup office visit. He has a history of established chronic asymptomatic atrial fibrillation. He is on Coumadin which is followed at the clinic in the [place]. He has had no thromboembolic events. He denies any chest pain or shortness of breath.  His energy level remains good.   Current Outpatient Prescriptions  Medication Sig Dispense Refill  . ALPRAZolam (XANAX) 1 MG tablet Take 1 tablet (1 mg total) by mouth 3 (three) times daily as needed.  90 tablet  5  . Ascorbic Acid (VITAMIN C PO) Take by mouth daily.        . benazepril-hydrochlorthiazide (LOTENSIN HCT) 20-12.5 MG per tablet Take 1 tablet by mouth daily.  90 tablet  3  . VITAMIN D PO Take by mouth daily.        Marland Kitchen warfarin (COUMADIN) 1 MG tablet Take coumadin 2 tablets daily except 1 tablet on Sundays, Tuesdays and Thursdays  60 tablet  4    Allergies  Allergen Reactions  . Ferrous Sulfate     REACTION: swelling, hives  . Sulfa Antibiotics     Reported per patient    Patient Active Problem List  Diagnosis  . ANXIETY  . HYPERTENSION  . HYPERGLYCEMIA  . Atrial fibrillation  . Joint pain  . Obesity  . Heart palpitations  . Encounter for long-term (current) use of anticoagulants    History  Smoking status  . Former Smoker -- 1.0 packs/day for 20 years  . Types: Cigarettes  . Quit date: 08/07/1975  Smokeless tobacco  . Not on file    History  Alcohol Use No    Family History  Problem Relation Age of Onset  . Stroke Mother   . Stroke Father   . Stroke Sister     Review of Systems: The patient denies any heat or cold intolerance.  No weight gain or weight loss.  The patient denies headaches or blurry vision.  There is no cough or sputum production.  The patient denies dizziness.  There is no  hematuria or hematochezia.  The patient denies any muscle aches or arthritis.  The patient denies any rash.  The patient denies frequent falling or instability.  There is no history of depression or anxiety.  All other systems were reviewed and are negative.   Physical Exam: Filed Vitals:   08/19/12 0850  BP: 130/70  Pulse: 64  Resp: 18   the general appearance reveals an elderly gentleman in no distress.The head and neck exam reveals pupils equal and reactive.  Extraocular movements are full.  There is no scleral icterus.  The mouth and pharynx are normal.  The neck is supple.  The carotids reveal no bruits.  The jugular venous pressure is normal.  The  thyroid is not enlarged.  There is no lymphadenopathy.  The chest is clear to percussion and auscultation.  There are no rales or rhonchi.  Expansion of the chest is symmetrical.  The precordium is quiet.  The first heart sound is normal.  The second heart sound is physiologically split.  There is no murmur gallop rub or click.  There is no abnormal lift or heave.  The abdomen is soft and nontender.  The bowel sounds are normal.  The liver and spleen are not enlarged.  There are no  abdominal masses.  There are no abdominal bruits.  Extremities reveal good pedal pulses.  There is no phlebitis or edema.  There is no cyanosis or clubbing.  Strength is normal and symmetrical in all extremities.  There is no lateralizing weakness.  There are no sensory deficits.  The skin is warm and dry.  There is no rash.      Assessment / Plan: Continue same medication.  In the winter he walks at [place] each day for exercise and in the summer he spends his time in the yard and garden.  We will plan to see him back in one year for followup office visit and EKG

## 2012-08-19 NOTE — Assessment & Plan Note (Signed)
The patient himself is unaware of his heart rhythm.  He has not been experiencing any TIA symptoms.  His warfarin is checked in [place] and he is not having any problems with his warfarin.  No evidence of hemorrhage or easy bruising

## 2012-08-19 NOTE — Assessment & Plan Note (Signed)
The patient has not been having any dizzy spells or syncope.  No headaches.  No symptoms of CHF.

## 2012-08-23 ENCOUNTER — Ambulatory Visit (INDEPENDENT_AMBULATORY_CARE_PROVIDER_SITE_OTHER): Payer: Medicare Other | Admitting: *Deleted

## 2012-08-23 DIAGNOSIS — Z7901 Long term (current) use of anticoagulants: Secondary | ICD-10-CM

## 2012-08-23 DIAGNOSIS — I4891 Unspecified atrial fibrillation: Secondary | ICD-10-CM

## 2012-08-23 LAB — POCT INR: INR: 1.9

## 2012-09-20 ENCOUNTER — Ambulatory Visit (INDEPENDENT_AMBULATORY_CARE_PROVIDER_SITE_OTHER): Payer: Medicare Other | Admitting: *Deleted

## 2012-09-20 DIAGNOSIS — I4891 Unspecified atrial fibrillation: Secondary | ICD-10-CM

## 2012-09-20 DIAGNOSIS — Z7901 Long term (current) use of anticoagulants: Secondary | ICD-10-CM

## 2012-09-20 LAB — POCT INR: INR: 2.5

## 2012-11-01 ENCOUNTER — Ambulatory Visit (INDEPENDENT_AMBULATORY_CARE_PROVIDER_SITE_OTHER): Payer: Medicare Other | Admitting: *Deleted

## 2012-11-01 DIAGNOSIS — I4891 Unspecified atrial fibrillation: Secondary | ICD-10-CM

## 2012-11-01 DIAGNOSIS — Z7901 Long term (current) use of anticoagulants: Secondary | ICD-10-CM

## 2012-11-01 LAB — POCT INR: INR: 2.2

## 2012-11-10 ENCOUNTER — Ambulatory Visit (INDEPENDENT_AMBULATORY_CARE_PROVIDER_SITE_OTHER): Payer: Medicare Other | Admitting: Family Medicine

## 2012-11-10 ENCOUNTER — Encounter: Payer: Self-pay | Admitting: Family Medicine

## 2012-11-10 VITALS — BP 102/80 | Temp 97.8°F | Wt 236.0 lb

## 2012-11-10 DIAGNOSIS — I10 Essential (primary) hypertension: Secondary | ICD-10-CM

## 2012-11-10 DIAGNOSIS — R7309 Other abnormal glucose: Secondary | ICD-10-CM

## 2012-11-10 DIAGNOSIS — K219 Gastro-esophageal reflux disease without esophagitis: Secondary | ICD-10-CM

## 2012-11-10 LAB — BASIC METABOLIC PANEL
CO2: 31 mEq/L (ref 19–32)
Calcium: 9.5 mg/dL (ref 8.4–10.5)
Creatinine, Ser: 1 mg/dL (ref 0.4–1.5)
GFR: 75.31 mL/min (ref >60.00)

## 2012-11-10 MED ORDER — ALPRAZOLAM 1 MG PO TABS: 1.0000 mg | ORAL_TABLET | Freq: Three times a day (TID) | ORAL | Status: DC | PRN

## 2012-11-10 NOTE — Progress Notes (Signed)
Quick Note:  Pt informed on personally identified VM ______ 

## 2012-11-10 NOTE — Progress Notes (Signed)
  Subjective:    Patient ID: Randall Lara, male    DOB: 1929/11/15, 79 y.o.   MRN: [id number]  HPI Patient seen for follow multiple items as below  Hypertension treated with benazepril HCTZ. Blood pressure well controlled. No recent dizziness or orthostasis. Compliant with treatment. 10 pound weight loss since last visit due to his efforts  History of chronic anxiety. Maintained for many years on alprazolam. We have tried tapering in the past without success.  History of GERD. Endoscopy 2003. Recently started having some increased mucus in throat and mild dysphagia symptoms. Started over-the-counter Prilosec symptoms resolved.  Past Medical History  Diagnosis Date  . Atrial fibrillation   . Joint pain   . Obesity   . Hypertension   . Anxiety   . Heart palpitations    No past surgical history on file.  reports that he quit smoking about 37 years ago. His smoking use included Cigarettes. He has a 20 pack-year smoking history. He does not have any smokeless tobacco history on file. He reports that he does not drink alcohol or use illicit drugs. family history includes Stroke in his father, mother, and sister. Allergies  Allergen Reactions  . Ferrous Sulfate     REACTION: swelling, hives  . Sulfa Antibiotics     Reported per patient      Review of Systems  Constitutional: Negative for appetite change, fatigue and unexpected weight change.  HENT: Negative for congestion, sore throat and trouble swallowing.   Eyes: Negative for visual disturbance.  Respiratory: Negative for cough, chest tightness and shortness of breath.   Cardiovascular: Negative for chest pain and palpitations.  Gastrointestinal: Negative for nausea, vomiting and abdominal pain.  Neurological: Negative for dizziness, syncope, weakness, light-headedness and headaches.       Objective:   Physical Exam  Constitutional: He is oriented to person, place, and time. He appears well-developed and well-nourished.   Neck: Neck supple. No thyromegaly present.  Cardiovascular: Normal rate and regular rhythm.  Exam reveals no gallop.   Pulmonary/Chest: Effort normal and breath sounds normal. No respiratory distress. He has no wheezes. He has no rales.  Musculoskeletal: Edema: only trace edema legs bilaterally which is chronic.  Neurological: He is alert and oriented to person, place, and time.  Psychiatric: He has a normal mood and affect. His behavior is normal.          Assessment & Plan:  #1 hypertension. Well controlled. Refill benazepril- HCTZ for one year. Check basic metabolic panel. #2 GERD. Controlled with over-the-counter Prilosec. Be in touch for any breakthrough symptoms. Discussed reduction in caffeine and caution with alcohol use. #3 chronic anxiety. Refill alprazolam for 6 months. We have tried serotonin reuptake inhibitors in the past which he has not tolerated

## 2012-11-29 ENCOUNTER — Telehealth: Payer: Self-pay

## 2012-11-29 NOTE — Telephone Encounter (Signed)
Called pt and went over soft diet again and he is scheduled for 9:30 AM on 12/08/2012 with AS.

## 2012-11-29 NOTE — Telephone Encounter (Signed)
Agree with backing off on diet. May 29 appointment should suffice

## 2012-11-29 NOTE — Telephone Encounter (Signed)
Pt called and asked to be seen today. He said he is having some difficulty swallowing and it has been going on for many months. Sometimes it feels like something stuck in the throat the size of an apple. He said he does not have any problems with liquids, just foods. When questioned about his foods he said he had some pork n beans and crackers  And it was a little difficult to get down. I told him he should only eat soft mushy foods for now such as mashed potatoes, apple sauce or yogurt. He can swallow his spit ok.   Pt said he saw Dr. Jena Gauss  Last about 10 years ago, His EGD in e-chart was done 03/29/2002.  Please advise as to how soon to schedule. No regular appts available til Thurs 12/08/2012.

## 2012-12-01 ENCOUNTER — Encounter: Payer: Self-pay | Admitting: Internal Medicine

## 2012-12-01 ENCOUNTER — Ambulatory Visit (INDEPENDENT_AMBULATORY_CARE_PROVIDER_SITE_OTHER): Payer: Medicare Other | Admitting: Gastroenterology

## 2012-12-01 VITALS — BP 148/77 | HR 62 | Temp 98.3°F | Ht 71.0 in | Wt 135.6 lb

## 2012-12-01 DIAGNOSIS — R09A2 Foreign body sensation, throat: Secondary | ICD-10-CM | POA: Insufficient documentation

## 2012-12-01 DIAGNOSIS — R0989 Other specified symptoms and signs involving the circulatory and respiratory systems: Secondary | ICD-10-CM

## 2012-12-01 DIAGNOSIS — F458 Other somatoform disorders: Secondary | ICD-10-CM

## 2012-12-01 NOTE — Progress Notes (Signed)
Primary Care Physician:  Kristian Covey, MD Primary Gastroenterologist:  Dr. Jena Gauss   Chief Complaint  Patient presents with  . Dysphagia    HPI:   79 year old male returns today secondary to globus sensation. Last EGD in 2003 by Dr. Jena Gauss was completely normal. To my knowledge, we have not seen him since that time.  Feels like someone has got his hand around his neck. No reflux, indigestion. Points to neck. X several months. Present constantly. If he bends over, it feels like it pushes on his neck. Was taking Mucinex awhile ago due to a cold. Feels like he still has mucus. Feels like he has to clear his throat a lot. Taking Prilosec. No problems with food. Just getting on his nerves. Has been drinking some liqour, 2-3 drinks every day. Only had one solid food incidence. No rectal bleeding, abdominal pain. No N/V. Called in to our office a few days ago after eating pork and beans, feeling like he was choking. States he had been working outside and ate too fast. Other than this episodes, NO solid food dysphagia.   Past Medical History  Diagnosis Date  . Atrial fibrillation   . Joint pain   . Obesity   . Hypertension   . Anxiety   . Heart palpitations   . GERD (gastroesophageal reflux disease)     Past Surgical History  Procedure Laterality Date  . Esophagogastroduodenoscopy  03/29/2002    [id number]:[id number] esophagus, stomach, and duodenum through the second  portion.  Marland Kitchen Hernia repair      Current Outpatient Prescriptions  Medication Sig Dispense Refill  . ALPRAZolam (XANAX) 1 MG tablet Take 1 tablet (1 mg total) by mouth 3 (three) times daily as needed.  90 tablet  5  . Ascorbic Acid (VITAMIN C PO) Take by mouth daily.        . benazepril-hydrochlorthiazide (LOTENSIN HCT) 20-12.5 MG per tablet Take 1 tablet by mouth daily.  90 tablet  3  . VITAMIN D PO Take by mouth daily.        Marland Kitchen warfarin (COUMADIN) 1 MG tablet Take coumadin 2 tablets daily except 1 tablet on Sundays, Tuesdays and  Thursdays  60 tablet  4   No current facility-administered medications for this visit.    Allergies as of 12/01/2012 - Review Complete 12/01/2012  Allergen Reaction Noted  . Ferrous sulfate  11/11/2009  . Sulfa antibiotics  08/18/2011    Family History  Problem Relation Age of Onset  . Stroke Mother   . Stroke Father   . Stroke Sister   . Colon cancer Neg Hx     History   Social History  . Marital Status: Married    Spouse Name: N/A    Number of Children: N/A  . Years of Education: N/A   Occupational History  . Not on file.   Social History Main Topics  . Smoking status: Former Smoker -- 1.00 packs/day for 20 years    Types: Cigarettes    Quit date: 08/07/1975  . Smokeless tobacco: Not on file  . Alcohol Use: Yes     Comment: a few drinks of liquor daily  . Drug Use: No  . Sexually Active: Not on file   Other Topics Concern  . Not on file   Social History Narrative  . No narrative on file    Review of Systems: Negative unless mentioned in HPI  Physical Exam: BP 148/77  Pulse 62  Temp(Src) 98.3 F (36.8 C) (Oral)  Ht 5\' 11"  (1.803 m)  Wt 135 lb 9.6 oz (61.508 kg)  BMI 18.92 kg/m2 General:   Alert and oriented. Pleasant and cooperative. Well-nourished and well-developed.  Head:  Normocephalic and atraumatic. Eyes:  Without icterus, sclera clear and conjunctiva pink.  Ears:  Normal auditory acuity. Nose:  No deformity, discharge,  or lesions. Mouth:  No deformity or lesions, oral mucosa pink.  Neck:  Supple, without mass or thyromegaly. Lungs:  Clear to auscultation bilaterally. No wheezes, rales, or rhonchi. No distress.  Heart:  S1, S2 present without murmurs appreciated.  Abdomen:  +BS, soft, non-tender and non-distended. No HSM noted. No guarding or rebound. No masses appreciated.  Rectal:  Deferred  Msk:  Symmetrical without gross deformities. Normal posture. Extremities:  Without clubbing or edema. Neurologic:  Alert and  oriented x4;  grossly  normal neurologically. Skin:  Intact without significant lesions or rashes. Cervical Nodes:  No significant cervical adenopathy. Psych:  Alert and cooperative. Normal mood and affect.

## 2012-12-01 NOTE — Patient Instructions (Addendum)
We have set you up for an xray to further evaluate your esophagus.  We may ultimately need to do an upper endoscopy, but that can be decided after review of the xray.  Make sure you take small bites, chew well, eat slowly.

## 2012-12-02 NOTE — Assessment & Plan Note (Signed)
79 year old male with symptoms that appear to be more of a globus sensation than dysphagia, returns today as a self-referral. He was last seen in 2003 and underwent a completely normal EGD by Dr. Jena Gauss. He notes recurrence of globus sensation for several months. I feel the incidence with pork and beans was related to eating too fast; otherwise, no esophageal dysphagia noted. No typical GERD symptoms, N/V, weight loss, lack of appetite. I discussed pursuing an upper endoscopy to further evaluate the upper GI tract, but he is quite hesitant to do this. He is on Coumadin, and he would rather pursue non-invasive measures first. After much discussion, he is agreeable to a barium pill esophagram, which will help further sort out his issues. Continue PPI daily. Discussed possibility of EGD as necessary. He would like to await BPE findings first.

## 2012-12-02 NOTE — Progress Notes (Signed)
Cc PCP 

## 2012-12-06 ENCOUNTER — Ambulatory Visit (HOSPITAL_COMMUNITY)
Admission: RE | Admit: 2012-12-06 | Discharge: 2012-12-06 | Disposition: A | Discharge status: Home / Self Care | Payer: Medicare Other | Source: Ambulatory Visit | Attending: Gastroenterology | Admitting: Gastroenterology

## 2012-12-06 DIAGNOSIS — K224 Dyskinesia of esophagus: Secondary | ICD-10-CM | POA: Insufficient documentation

## 2012-12-06 DIAGNOSIS — R6889 Other general symptoms and signs: Secondary | ICD-10-CM | POA: Insufficient documentation

## 2012-12-06 DIAGNOSIS — R1310 Dysphagia, unspecified: Secondary | ICD-10-CM | POA: Insufficient documentation

## 2012-12-06 DIAGNOSIS — R0989 Other specified symptoms and signs involving the circulatory and respiratory systems: Secondary | ICD-10-CM

## 2012-12-07 ENCOUNTER — Telehealth: Payer: Self-pay | Admitting: Internal Medicine

## 2012-12-07 NOTE — Telephone Encounter (Signed)
I have sent a result note.  

## 2012-12-07 NOTE — Telephone Encounter (Signed)
Pt has multiple questions about medications. [phone]

## 2012-12-07 NOTE — Telephone Encounter (Signed)
Spoke with pt- he wants to know what his results are, what he can eat/drink and what he cant eat/drink, which is the best ppi for him to take. Please advise.

## 2012-12-07 NOTE — Telephone Encounter (Signed)
Pt called this afternoon with multiple questions about his medications. Please call him back at [phone]

## 2012-12-07 NOTE — Telephone Encounter (Signed)
Tried to call pt- LMOM 

## 2012-12-07 NOTE — Progress Notes (Signed)
Quick Note:  Please let patient know that he has normal, age-related changes of his esophagus. Some liquids may "pool" in the back of his throat but are not going down the wrong way.  A muscle that helps food into the esophagus is prominent but DOES NOT obstruct the tablet that he swallowed. This went down fine. No strictures noted.  Further evaluation can be done by an EGD, but he is on Coumadin. This would need to be worked out with cardiology to hold for 3 days prior.   I thought he was on a PPI, but upon review of his chart, I don't see it listed. I am sending Protonix to his pharmacy to take daily, 30 minutes before breakfast.  He needs to follow a soft diet, avoid tough textures like steak, tough breads. He needs to sit upright while eating and remain upright at least an hour after.  He needs to eat slowly, taking smaller bites and chewing well.  For now, these are things he can do until he decides about pursuing an EGD/ED ______

## 2012-12-07 NOTE — Progress Notes (Signed)
Quick Note:  Tried to call pt- LMOM ______ 

## 2012-12-08 ENCOUNTER — Ambulatory Visit: Payer: Medicare Other | Admitting: Gastroenterology

## 2012-12-08 MED ORDER — PANTOPRAZOLE SODIUM 40 MG PO TBEC: 40.0000 mg | DELAYED_RELEASE_TABLET | Freq: Every day | ORAL | Status: DC

## 2012-12-08 NOTE — Addendum Note (Signed)
Addended by: [name] Rude on: 12/08/2012 09:54 AM   Modules accepted: Orders

## 2012-12-08 NOTE — Telephone Encounter (Signed)
Pt is aware of results. AS was going to send rx for protonix, I did not see where it had been sent in so I sent rx to pharmacy.

## 2012-12-08 NOTE — Progress Notes (Signed)
Quick Note:  Pt is aware, he stated he understood recommendations. rx has been sent to the pharmacy. He will think about having an egd done. He stated he feels like he is getting better now. ______

## 2012-12-13 ENCOUNTER — Ambulatory Visit (INDEPENDENT_AMBULATORY_CARE_PROVIDER_SITE_OTHER): Payer: Medicare Other | Admitting: *Deleted

## 2012-12-13 DIAGNOSIS — Z7901 Long term (current) use of anticoagulants: Secondary | ICD-10-CM

## 2012-12-13 DIAGNOSIS — I4891 Unspecified atrial fibrillation: Secondary | ICD-10-CM

## 2012-12-13 LAB — POCT INR: INR: 2.6

## 2012-12-27 ENCOUNTER — Telehealth: Payer: Self-pay | Admitting: Internal Medicine

## 2012-12-27 NOTE — Telephone Encounter (Signed)
Pt was seen recently on 5/22 by AS for difficulty swallowing and called today saying he isn't getting any better and wants to speak with RMR ONLY. Pt is aware of OV on 7/15 at 2 with RMR. Can we advise him on what he can do during the meantime? Patient gets very anxious on the phone. [phone]

## 2012-12-28 NOTE — Telephone Encounter (Signed)
Tried to call pt- LM with wife for return call.

## 2012-12-28 NOTE — Telephone Encounter (Signed)
Spoke with pt- he said all he wanted was an appointment with RMR and it was made for 01/24/13 and he didn't need anything else right now.

## 2013-01-24 ENCOUNTER — Encounter: Payer: Self-pay | Admitting: Internal Medicine

## 2013-01-24 ENCOUNTER — Ambulatory Visit (INDEPENDENT_AMBULATORY_CARE_PROVIDER_SITE_OTHER): Payer: Medicare Other | Admitting: *Deleted

## 2013-01-24 ENCOUNTER — Ambulatory Visit (INDEPENDENT_AMBULATORY_CARE_PROVIDER_SITE_OTHER): Payer: Medicare Other | Admitting: Internal Medicine

## 2013-01-24 VITALS — BP 131/78 | HR 51 | Temp 98.2°F | Ht 72.0 in | Wt 232.0 lb

## 2013-01-24 DIAGNOSIS — Z7901 Long term (current) use of anticoagulants: Secondary | ICD-10-CM

## 2013-01-24 DIAGNOSIS — F458 Other somatoform disorders: Secondary | ICD-10-CM

## 2013-01-24 DIAGNOSIS — I4891 Unspecified atrial fibrillation: Secondary | ICD-10-CM

## 2013-01-24 DIAGNOSIS — F449 Dissociative and conversion disorder, unspecified: Secondary | ICD-10-CM

## 2013-01-24 LAB — POCT INR: INR: 2.4

## 2013-01-24 MED ORDER — WARFARIN SODIUM 1 MG PO TABS: ORAL_TABLET | ORAL | Status: DC

## 2013-01-24 NOTE — Progress Notes (Signed)
Primary Care Physician:  Kristian Covey, MD Primary Gastroenterologist:  Dr. Jena Gauss  Pre-Procedure History & Physical: HPI:  Randall Lara is a 79 y.o. male here for "lump in throat". Patient reports 80-90% improvement in this symptom. Has for tonics 40 mg daily. Nocturnal coughing has improved 2 (spouse observation). Did apparently have a URI the first of the year and had a tendency to drink a little hard liquor on occasion. Feels it burned his throat. Barium pill esophagram demonstrated some dysmotility and pooling in the vallecula. No aspiration. No obstruction to barium pill. He really does not describe any oropharyngeal or esophageal dysphagia with, whatsoever. No typical reflux symptoms. He continues on Coumadin for echo for ablation.  Symptoms worse with stress as wife observes. Past Medical History  Diagnosis Date  . Atrial fibrillation   . Joint pain   . Obesity   . Hypertension   . Anxiety   . Heart palpitations   . GERD (gastroesophageal reflux disease)     Past Surgical History  Procedure Laterality Date  . Esophagogastroduodenoscopy  03/29/2002    ZOX:[id number] esophagus, stomach, and duodenum through the second  portion.  Marland Kitchen Hernia repair      Prior to Admission medications   Medication Sig Start Date End Date Taking? Authorizing Provider  ALPRAZolam Prudy Feeler) 1 MG tablet Take 1 tablet (1 mg total) by mouth 3 (three) times daily as needed. 11/10/12  Yes Kristian Covey, MD  Ascorbic Acid (VITAMIN C PO) Take by mouth daily.     Yes Historical Provider, MD  benazepril-hydrochlorthiazide (LOTENSIN HCT) 20-12.5 MG per tablet Take 1 tablet by mouth daily. 05/13/12  Yes Kristian Covey, MD  pantoprazole (PROTONIX) 40 MG tablet Take 1 tablet (40 mg total) by mouth daily. 12/08/12 12/08/13 Yes Nira Retort, NP  VITAMIN D PO Take by mouth daily.     Yes Historical Provider, MD  warfarin (COUMADIN) 1 MG tablet Take coumadin 2 tablets daily except 1 tablet on Sundays, Tuesdays and Thursdays  01/24/13  Yes Cassell Clement, MD    Allergies as of 01/24/2013 - Review Complete 01/24/2013  Allergen Reaction Noted  . Ferrous sulfate  11/11/2009  . Sulfa antibiotics  08/18/2011    Family History  Problem Relation Age of Onset  . Stroke Mother   . Stroke Father   . Stroke Sister   . Colon cancer Neg Hx     History   Social History  . Marital Status: Married    Spouse Name: N/A    Number of Children: N/A  . Years of Education: N/A   Occupational History  . Not on file.   Social History Main Topics  . Smoking status: Former Smoker -- 1.00 packs/day for 20 years    Types: Cigarettes    Quit date: 08/07/1975  . Smokeless tobacco: Not on file  . Alcohol Use: Yes     Comment: a few drinks of liquor daily  . Drug Use: No  . Sexually Active: Not on file   Other Topics Concern  . Not on file   Social History Narrative  . No narrative on file    Review of Systems: See HPI, otherwise negative ROS  Physical Exam: BP 131/78  Pulse 51  Temp(Src) 98.2 F (36.8 C) (Oral)  Ht 6' (1.829 m)  Wt 232 lb (105.235 kg)  BMI 31.46 kg/m2 General:  Elderly gentleman resting comfortably. well-nourished, pleasant and cooperative in NAD accompanied by spouse. Skin:  Intact without significant lesions or  rashes. Eyes:  Sclera clear, no icterus.   Conjunctiva pink. Ears:  Normal auditory acuity. Nose:  No deformity, discharge,  or lesions. Mouth:  No deformity or lesions. Neck:  Supple; no masses or thyromegaly. No significant cervical adenopathy. Lungs:  Clear throughout to auscultation.   No wheezes, crackles, or rhonchi. No acute distress. Heart:  Regular rate and rhythm; no murmurs, clicks, rubs,  or gallops. Abdomen: Non-distended, normal bowel sounds.  Soft and nontender without appreciable mass or hepatosplenomegaly.  Pulses:  Normal pulses noted. Extremities:  Without clubbing or edema.  Impression/Plan:  Pleasant 79 year old gentleman with a six-month history of  globus symptoms. He reports "80-90%" improvement in his symptoms. Because cough also improved,  he certainly could have had an element of reflux but I suspect a URI several months ago may have triggered this scenario. No alarm symptoms at this time. Barium pill esophagram reassuring.  Recommendations:  Reassurance. I do not recommend cross-sectional imaging of his neck or EGD with empiric dilation (the risks of the latter approach not worth the benefits)  Continued for tonics 40 mg daily. Antireflux diet literature provided. Limit alcohol. Plan to see him back in the office in 3 months to reassess.

## 2013-01-24 NOTE — Patient Instructions (Signed)
Continue Protonix daily  GERD diet   Office visit with [place] in 3 months

## 2013-03-07 ENCOUNTER — Encounter: Payer: Self-pay | Admitting: *Deleted

## 2013-03-10 ENCOUNTER — Ambulatory Visit (INDEPENDENT_AMBULATORY_CARE_PROVIDER_SITE_OTHER): Payer: Medicare Other | Admitting: *Deleted

## 2013-03-10 DIAGNOSIS — Z7901 Long term (current) use of anticoagulants: Secondary | ICD-10-CM

## 2013-03-10 DIAGNOSIS — I4891 Unspecified atrial fibrillation: Secondary | ICD-10-CM

## 2013-04-12 ENCOUNTER — Encounter: Payer: Self-pay | Admitting: Internal Medicine

## 2013-04-21 ENCOUNTER — Ambulatory Visit (INDEPENDENT_AMBULATORY_CARE_PROVIDER_SITE_OTHER): Payer: Medicare Other | Admitting: *Deleted

## 2013-04-21 DIAGNOSIS — Z7901 Long term (current) use of anticoagulants: Secondary | ICD-10-CM

## 2013-04-21 DIAGNOSIS — I4891 Unspecified atrial fibrillation: Secondary | ICD-10-CM

## 2013-04-21 LAB — POCT INR: INR: 2.5

## 2013-05-12 ENCOUNTER — Ambulatory Visit (INDEPENDENT_AMBULATORY_CARE_PROVIDER_SITE_OTHER): Payer: Medicare Other | Admitting: Internal Medicine

## 2013-05-12 ENCOUNTER — Encounter: Payer: Self-pay | Admitting: Internal Medicine

## 2013-05-12 VITALS — BP 124/82 | HR 83 | Temp 98.4°F | Wt 234.6 lb

## 2013-05-12 DIAGNOSIS — F458 Other somatoform disorders: Secondary | ICD-10-CM

## 2013-05-12 DIAGNOSIS — F449 Dissociative and conversion disorder, unspecified: Secondary | ICD-10-CM

## 2013-05-12 DIAGNOSIS — R09A2 Foreign body sensation, throat: Secondary | ICD-10-CM

## 2013-05-12 DIAGNOSIS — K219 Gastro-esophageal reflux disease without esophagitis: Secondary | ICD-10-CM

## 2013-05-12 NOTE — Progress Notes (Signed)
Primary Care Physician:  Kristian Covey, MD Primary Gastroenterologist:  Dr. Jena Gauss  Pre-Procedure History & Physical: HPI:  Randall Lara is a 79 y.o. male here for followup of globus and GERD. Developed globus symptoms after URI earlier this year. They have pretty much resolved. He is "99% better". He really denies having any symptoms consistent with dysphagia. GERD symptoms well controlled. Bowel issues. Passing any blood, et Karie Soda. Patient tells me he had a colonoscopy in [place] but 78 years ago here thing checked out. He incidentally mentioned he is not interested in having any further colonoscopies in the future.  Past Medical History  Diagnosis Date  . Atrial fibrillation   . Joint pain   . Obesity   . Hypertension   . Anxiety   . Heart palpitations   . GERD (gastroesophageal reflux disease)     Past Surgical History  Procedure Laterality Date  . Esophagogastroduodenoscopy  03/29/2002    ZOX:WRUEAV esophagus, stomach, and duodenum through the second  portion.  Marland Kitchen Hernia repair      Prior to Admission medications   Medication Sig Start Date End Date Taking? Authorizing Provider  ALPRAZolam Prudy Feeler) 1 MG tablet Take 1 tablet (1 mg total) by mouth 3 (three) times daily as needed. 11/10/12  Yes Kristian Covey, MD  Ascorbic Acid (VITAMIN C PO) Take by mouth daily.     Yes Historical Provider, MD  benazepril-hydrochlorthiazide (LOTENSIN HCT) 20-12.5 MG per tablet Take 1 tablet by mouth daily. 05/13/12  Yes Kristian Covey, MD  pantoprazole (PROTONIX) 40 MG tablet Take 1 tablet (40 mg total) by mouth daily. 12/08/12 12/08/13 Yes Nira Retort, NP  VITAMIN D PO Take by mouth daily.     Yes Historical Provider, MD  warfarin (COUMADIN) 1 MG tablet Take coumadin 2 tablets daily except 1 tablet on Sundays, Tuesdays and Thursdays 01/24/13  Yes Cassell Clement, MD    Allergies as of 05/12/2013 - Review Complete 05/12/2013  Allergen Reaction Noted  . Ferrous sulfate  11/11/2009  .  Sulfa antibiotics  08/18/2011    Family History  Problem Relation Age of Onset  . Stroke Mother   . Stroke Father   . Stroke Sister   . Colon cancer Neg Hx     History   Social History  . Marital Status: Married    Spouse Name: N/A    Number of Children: N/A  . Years of Education: N/A   Occupational History  . Not on file.   Social History Main Topics  . Smoking status: Former Smoker -- 1.00 packs/day for 20 years    Types: Cigarettes    Quit date: 08/07/1975  . Smokeless tobacco: Not on file  . Alcohol Use: Yes     Comment: a few drinks of liquor daily  . Drug Use: No  . Sexual Activity: Not on file   Other Topics Concern  . Not on file   Social History Narrative  . No narrative on file    Review of Systems: See HPI, otherwise negative ROS  Physical Exam: BP 124/82  Pulse 83  Temp(Src) 98.4 F (36.9 C) (Oral)  Wt 234 lb 9.6 oz (106.414 kg)  BMI 31.81 kg/m2 General:   Elderly, Alert,  Well-developed, well-nourished, pleasant and cooperative in NAD. He is accompanied by his wife. Skin:  Intact without significant lesions or rashes. Eyes:  Sclera clear, no icterus.   Conjunctiva pink. Ears:  Normal auditory acuity. Nose:  No deformity, discharge,  or lesions.  Mouth:  No deformity or lesions. Neck:  Supple; no masses or thyromegaly. No significant cervical adenopathy. Lungs:  Clear throughout to auscultation.   No wheezes, crackles, or rhonchi. No acute distress. Heart:  Regular rate and rhythm; no murmurs, clicks, rubs,  or gallops. Abdomen: Non-distended, normal bowel sounds.  Soft and nontender without appreciable mass or hepatosplenomegaly.  Pulses:  Normal pulses noted. Extremities:  Without clubbing or edema.  Impression/Plan: Pleasant 79 year old gentleman with episode of globus and GERD now much improved on Protonix. He is very happy with his progress. I see no reason for further GI evaluation at this time. Reviewed the multipronged approach  regarding reflux with the patient and his wife today.  I have provided him literature on GERD. I recommend he continue taking Protonix. 40 mg daily for the foreseeable future. I believe the benefits outweigh the risks. Unless something comes up, plan to see this nice gentleman back in one year.

## 2013-05-12 NOTE — Patient Instructions (Signed)
Continue Protonix 40 mg daily  GERD information provided  Office visit in 1 year  

## 2013-05-15 ENCOUNTER — Ambulatory Visit (INDEPENDENT_AMBULATORY_CARE_PROVIDER_SITE_OTHER): Payer: Medicare Other | Admitting: Family Medicine

## 2013-05-15 ENCOUNTER — Encounter: Payer: Self-pay | Admitting: Family Medicine

## 2013-05-15 ENCOUNTER — Other Ambulatory Visit: Payer: Self-pay

## 2013-05-15 VITALS — BP 126/70 | HR 70 | Temp 97.7°F | Wt 235.0 lb

## 2013-05-15 DIAGNOSIS — I10 Essential (primary) hypertension: Secondary | ICD-10-CM

## 2013-05-15 DIAGNOSIS — E669 Obesity, unspecified: Secondary | ICD-10-CM | POA: Insufficient documentation

## 2013-05-15 DIAGNOSIS — F411 Generalized anxiety disorder: Secondary | ICD-10-CM

## 2013-05-15 DIAGNOSIS — I4891 Unspecified atrial fibrillation: Secondary | ICD-10-CM

## 2013-05-15 MED ORDER — BENAZEPRIL-HYDROCHLOROTHIAZIDE 20-12.5 MG PO TABS: 1.0000 | ORAL_TABLET | Freq: Every day | ORAL | Status: DC

## 2013-05-15 MED ORDER — ALPRAZOLAM 1 MG PO TABS: 1.0000 mg | ORAL_TABLET | Freq: Three times a day (TID) | ORAL | Status: DC | PRN

## 2013-05-15 NOTE — Progress Notes (Signed)
  Subjective:    Patient ID: Randall Lara, male    DOB: 05-06-1930, 79 y.o.   MRN: [id number]  HPI Patient seen for medical followup Hypertension well controlled on lisinopril HCTZ. No dizziness. No chest pains. No dyspnea. He has some generalized lethargy off and on which has been chronic. Compliant with medication Chronic anxiety treated with Xanax for many years. No recent falls. No imbalance issues. Patient already had flu vaccine. Denies any depressed mood.  Patient remains on Coumadin for history of atrial fibrillation. No bleeding complications.  Past Medical History  Diagnosis Date  . Atrial fibrillation   . Joint pain   . Obesity   . Hypertension   . Anxiety   . Heart palpitations   . GERD (gastroesophageal reflux disease)    Past Surgical History  Procedure Laterality Date  . Esophagogastroduodenoscopy  03/29/2002    [id number] esophagus, stomach, and duodenum through the second  portion.  Marland Kitchen Hernia repair      reports that he quit smoking about 37 years ago. His smoking use included Cigarettes. He has a 20 pack-year smoking history. He does not have any smokeless tobacco history on file. He reports that he drinks alcohol. He reports that he does not use illicit drugs. family history includes Stroke in his father, mother, and sister. There is no history of Colon cancer. Allergies  Allergen Reactions  . Ferrous Sulfate     REACTION: swelling, hives  . Sulfa Antibiotics     Reported per patient        Review of Systems  Constitutional: Positive for fatigue. Negative for unexpected weight change.  Eyes: Negative for visual disturbance.  Respiratory: Negative for cough, chest tightness and shortness of breath.   Cardiovascular: Negative for chest pain, palpitations and leg swelling.  Neurological: Negative for dizziness, syncope, weakness, light-headedness and headaches.       Objective:   Physical Exam  Constitutional: He appears well-developed and  well-nourished.  Neck: Neck supple. No thyromegaly present.  Cardiovascular: Normal rate and regular rhythm.   Pulmonary/Chest: Effort normal and breath sounds normal. No respiratory distress. He has no wheezes. He has no rales.  Musculoskeletal: He exhibits no edema.          Assessment & Plan:  #1 hypertension. Adequately controlled. Refill benazepril HCTZ for one year. Check basic metabolic panel at followup consider lipids at followup #2 chronic anxiety. Refill alprazolam. #3 atrial fibrillation. Currently sinus rhythm. Continue Coumadin. Getting INR monitored through hospital in [place], [place]

## 2013-06-02 ENCOUNTER — Ambulatory Visit (INDEPENDENT_AMBULATORY_CARE_PROVIDER_SITE_OTHER): Payer: Medicare Other | Admitting: *Deleted

## 2013-06-02 DIAGNOSIS — Z7901 Long term (current) use of anticoagulants: Secondary | ICD-10-CM

## 2013-06-02 DIAGNOSIS — I4891 Unspecified atrial fibrillation: Secondary | ICD-10-CM

## 2013-06-02 LAB — POCT INR: INR: 2.1

## 2013-06-14 ENCOUNTER — Telehealth: Payer: Self-pay | Admitting: Family Medicine

## 2013-06-14 NOTE — Telephone Encounter (Signed)
Pt would like to start having his protime check at our office. Can I schedule

## 2013-06-15 ENCOUNTER — Telehealth: Payer: Self-pay | Admitting: Cardiology

## 2013-06-15 NOTE — Telephone Encounter (Signed)
New Problem:  Pt states when he walks his chest hurts and his left arm hurts. Pt states when his is sitting his chest is not hurting. Pt states his chest was hurting him this morning. Pt would like to speak to the nurse.

## 2013-06-15 NOTE — Telephone Encounter (Signed)
Per Bailey Mech, pt can be scheduled

## 2013-06-15 NOTE — Telephone Encounter (Signed)
lmom for pt to call back

## 2013-06-15 NOTE — Telephone Encounter (Signed)
Pt has been sch

## 2013-06-15 NOTE — Telephone Encounter (Signed)
Spoke with this patient who is complaining of chest pain with exertion relieved with rest on and off for 1 month. Advised to see Dr.Brackbill tomorrow at 415 pm. He agreed with this plan and will bring his wife with him tomorrow.

## 2013-06-16 ENCOUNTER — Ambulatory Visit (INDEPENDENT_AMBULATORY_CARE_PROVIDER_SITE_OTHER): Payer: Medicare Other | Admitting: Cardiology

## 2013-06-16 ENCOUNTER — Encounter: Payer: Self-pay | Admitting: Cardiology

## 2013-06-16 VITALS — BP 138/71 | HR 77 | Ht 73.0 in | Wt 233.0 lb

## 2013-06-16 DIAGNOSIS — I4891 Unspecified atrial fibrillation: Secondary | ICD-10-CM

## 2013-06-16 DIAGNOSIS — I119 Hypertensive heart disease without heart failure: Secondary | ICD-10-CM

## 2013-06-16 DIAGNOSIS — Z7901 Long term (current) use of anticoagulants: Secondary | ICD-10-CM

## 2013-06-16 DIAGNOSIS — R079 Chest pain, unspecified: Secondary | ICD-10-CM

## 2013-06-16 DIAGNOSIS — R002 Palpitations: Secondary | ICD-10-CM

## 2013-06-16 DIAGNOSIS — I10 Essential (primary) hypertension: Secondary | ICD-10-CM

## 2013-06-16 MED ORDER — NITROGLYCERIN 0.4 MG SL SUBL: 0.4000 mg | SUBLINGUAL_TABLET | SUBLINGUAL | Status: DC | PRN

## 2013-06-16 NOTE — Progress Notes (Signed)
Randall Lara Date of Birth:  10-28-1929 [address] [place], [place]  [address] [phone]  Fax   [phone]  HPI: This pleasant 79 year old gentleman is seen for a work in office visit. He has a history of established chronic asymptomatic atrial fibrillation. He is on Coumadin which is followed at the clinic in the [place]. He has had no thromboembolic events.  He does not have any history of known ischemic heart disease.  For the past month he has been experiencing some intermittent fullness in his chest with some radiation down the left arm with exertion.  As soon as he stops to rest the discomfort goes away.  He did not know whether to attribute this to nerves and stress or something else.  His wife encouraged him to get it checked.   Current Outpatient Prescriptions  Medication Sig Dispense Refill  . ALPRAZolam (XANAX) 1 MG tablet Take 1 tablet (1 mg total) by mouth 3 (three) times daily as needed.  90 tablet  5  . Ascorbic Acid (VITAMIN C PO) Take by mouth daily.        . benazepril-hydrochlorthiazide (LOTENSIN HCT) 20-12.5 MG per tablet Take 1 tablet by mouth daily.  90 tablet  3  . nitroGLYCERIN (NITROSTAT) 0.4 MG SL tablet Place 1 tablet (0.4 mg total) under the tongue every 5 (five) minutes as needed for chest pain.  25 tablet  11  . pantoprazole (PROTONIX) 40 MG tablet Take 1 tablet (40 mg total) by mouth daily.  30 tablet  11  . VITAMIN D PO Take by mouth daily.        Marland Kitchen warfarin (COUMADIN) 1 MG tablet Take coumadin 2 tablets daily except 1 tablet on Sundays, Tuesdays and Thursdays  60 tablet  4   No current facility-administered medications for this visit.    Allergies  Allergen Reactions  . Ferrous Sulfate     REACTION: swelling, hives  . Sulfa Antibiotics     Reported per patient    Patient Active Problem List   Diagnosis Date Noted  . Atrial fibrillation     Priority: High  . Heart palpitations     Priority: Medium  . HYPERTENSION 02/11/2009     Priority: Medium  . Chest pain 06/16/2013  . Obesity (BMI 30-39.9) 05/15/2013  . Globus sensation 12/01/2012  . GERD (gastroesophageal reflux disease) 11/10/2012  . Encounter for long-term (current) use of anticoagulants 10/20/2010  . Joint pain   . Obesity   . HYPERGLYCEMIA 05/08/2010  . ANXIETY 02/11/2009    History  Smoking status  . Former Smoker -- 1.00 packs/day for 20 years  . Types: Cigarettes  . Quit date: 08/07/1975  Smokeless tobacco  . Not on file    History  Alcohol Use  . Yes    Comment: a few drinks of liquor daily    Family History  Problem Relation Age of Onset  . Stroke Mother   . Stroke Father   . Stroke Sister   . Colon cancer Neg Hx     Review of Systems: The patient denies any heat or cold intolerance.  No weight gain or weight loss.  The patient denies headaches or blurry vision.  There is no cough or sputum production.  The patient denies dizziness.  There is no hematuria or hematochezia.  The patient denies any muscle aches or arthritis.  The patient denies any rash.  The patient denies frequent falling or instability.  There is no history of  depression or anxiety.  All other systems were reviewed and are negative.   Physical Exam: Filed Vitals:   06/16/13 1617  BP: 138/71  Pulse: 77   the general appearance reveals an elderly gentleman in no distress.The head and neck exam reveals pupils equal and reactive.  Extraocular movements are full.  There is no scleral icterus.  The mouth and pharynx are normal.  The neck is supple.  The carotids reveal no bruits.  The jugular venous pressure is normal.  The  thyroid is not enlarged.  There is no lymphadenopathy.  The chest is clear to percussion and auscultation.  There are no rales or rhonchi.  Expansion of the chest is symmetrical.  The precordium is quiet.  The first heart sound is normal.  The second heart sound is physiologically split.  There is no murmur gallop rub or click.  There is no abnormal  lift or heave.  The abdomen is soft and nontender.  The bowel sounds are normal.  The liver and spleen are not enlarged.  There are no abdominal masses.  There are no abdominal bruits.  Extremities reveal good pedal pulses.  There is no phlebitis or edema.  There is no cyanosis or clubbing.  Strength is normal and symmetrical in all extremities.  There is no lateralizing weakness.  There are no sensory deficits.  The skin is warm and dry.  There is no rash.      Assessment / Plan: Continue same medication.  In the winter he walks at [place] each day for exercise and in the summer he spends his time in the yard and garden.  We will plan to see him back in one year for followup office visit and EKG      Randall Lara Date of Birth:  [birth year] [facility] [address] [place], [place]  [address] [phone]  Fax   [phone]  HPI: This pleasant 79 year old gentleman is seen for a six-month followup office visit. He has a history of established chronic asymptomatic atrial fibrillation. He is on Coumadin which is followed at the clinic in the [place]. He has had no thromboembolic events. He denies any chest pain or shortness of breath.  His energy level remains good.   Current Outpatient Prescriptions  Medication Sig Dispense Refill  . ALPRAZolam (XANAX) 1 MG tablet Take 1 tablet (1 mg total) by mouth 3 (three) times daily as needed.  90 tablet  5  . Ascorbic Acid (VITAMIN C PO) Take by mouth daily.        . benazepril-hydrochlorthiazide (LOTENSIN HCT) 20-12.5 MG per tablet Take 1 tablet by mouth daily.  90 tablet  3  . nitroGLYCERIN (NITROSTAT) 0.4 MG SL tablet Place 1 tablet (0.4 mg total) under the tongue every 5 (five) minutes as needed for chest pain.  25 tablet  11  . pantoprazole (PROTONIX) 40 MG tablet Take 1 tablet (40 mg total) by mouth daily.  30 tablet  11  . VITAMIN D PO Take by mouth daily.        Marland Kitchen warfarin (COUMADIN) 1 MG tablet Take coumadin 2 tablets  daily except 1 tablet on Sundays, Tuesdays and Thursdays  60 tablet  4   No current facility-administered medications for this visit.    Allergies  Allergen Reactions  . Ferrous Sulfate     REACTION: swelling, hives  . Sulfa Antibiotics     Reported per patient    Patient Active Problem List   Diagnosis  Date Noted  . Atrial fibrillation     Priority: High  . Heart palpitations     Priority: Medium  . HYPERTENSION 02/11/2009    Priority: Medium  . Chest pain 06/16/2013  . Obesity (BMI 30-39.9) 05/15/2013  . Globus sensation 12/01/2012  . GERD (gastroesophageal reflux disease) 11/10/2012  . Encounter for long-term (current) use of anticoagulants 10/20/2010  . Joint pain   . Obesity   . HYPERGLYCEMIA 05/08/2010  . ANXIETY 02/11/2009    History  Smoking status  . Former Smoker -- 1.00 packs/day for 20 years  . Types: Cigarettes  . Quit date: 08/07/1975  Smokeless tobacco  . Not on file    History  Alcohol Use  . Yes    Comment: a few drinks of liquor daily    Family History  Problem Relation Age of Onset  . Stroke Mother   . Stroke Father   . Stroke Sister   . Colon cancer Neg Hx     Review of Systems: The patient denies any heat or cold intolerance.  No weight gain or weight loss.  The patient denies headaches or blurry vision.  There is no cough or sputum production.  The patient denies dizziness.  There is no hematuria or hematochezia.  The patient denies any muscle aches or arthritis.  The patient denies any rash.  The patient denies frequent falling or instability.  There is no history of depression or anxiety.  All other systems were reviewed and are negative.   Physical Exam: Filed Vitals:   06/16/13 1617  BP: 138/71  Pulse: 77   the general appearance reveals an elderly gentleman in no distress.The head and neck exam reveals pupils equal and reactive.  Extraocular movements are full.  There is no scleral icterus.  The mouth and pharynx are normal.   The neck is supple.  The carotids reveal no bruits.  The jugular venous pressure is normal.  The  thyroid is not enlarged.  There is no lymphadenopathy.  The chest is clear to percussion and auscultation.  There are no rales or rhonchi.  Expansion of the chest is symmetrical.  The precordium is quiet.  The pulse is irregularly irregular.  The first heart sound is normal.  The second heart sound is physiologically split.  There is no murmur gallop rub or click.  There is no abnormal lift or heave.  The abdomen is soft and nontender.  The bowel sounds are normal.  The liver and spleen are not enlarged.  There are no abdominal masses.  There are no abdominal bruits.  Extremities reveal good pedal pulses.  There is no phlebitis or edema.  There is no cyanosis or clubbing.  Strength is normal and symmetrical in all extremities.  There is no lateralizing weakness.  There are no sensory deficits.  The skin is warm and dry.  There is no rash.  EKG shows atrial fibrillation with controlled ventricular response.  No ischemic changes at rest   Assessment / Plan: Continue same medication.  We will also give him a prescription for Nitrostat 0.4 mg tablet hand for when necessary use.  We will have him return for a Lexus scan Myoview stress test soon.

## 2013-06-16 NOTE — Assessment & Plan Note (Signed)
The patient is in permanent atrial fibrillation.  He has not been aware of any recent palpitations or pounding in his chest

## 2013-06-16 NOTE — Assessment & Plan Note (Signed)
The EKG today shows atrial fibrillation with controlled ventricular response.  The patient does not demonstrate any ischemic ST segment changes at rest.  We will have him return for any Lexus scan Myoview stress test

## 2013-06-16 NOTE — Patient Instructions (Addendum)
Your physician has requested that you have a lexiscan myoview. For further information please visit [email or web address] Please follow instruction sheet, as given.  Use your NTG under your tongue for recurrent chest pain. May take one tablet every 5 minutes. If you are still having discomfort after 3 tablets in 15 minutes, call 911.

## 2013-06-16 NOTE — Assessment & Plan Note (Signed)
Blood pressure was remaining stable on current medication.  No dizziness or syncope. 

## 2013-07-05 ENCOUNTER — Ambulatory Visit (HOSPITAL_COMMUNITY): Payer: Medicare Other | Attending: Cardiology | Admitting: Radiology

## 2013-07-05 ENCOUNTER — Encounter: Payer: Self-pay | Admitting: Cardiology

## 2013-07-05 VITALS — BP 105/66 | Ht 72.0 in | Wt 234.0 lb

## 2013-07-05 DIAGNOSIS — Z87891 Personal history of nicotine dependence: Secondary | ICD-10-CM | POA: Insufficient documentation

## 2013-07-05 DIAGNOSIS — R079 Chest pain, unspecified: Secondary | ICD-10-CM | POA: Insufficient documentation

## 2013-07-05 DIAGNOSIS — I4891 Unspecified atrial fibrillation: Secondary | ICD-10-CM | POA: Insufficient documentation

## 2013-07-05 DIAGNOSIS — Z8249 Family history of ischemic heart disease and other diseases of the circulatory system: Secondary | ICD-10-CM | POA: Insufficient documentation

## 2013-07-05 DIAGNOSIS — I10 Essential (primary) hypertension: Secondary | ICD-10-CM | POA: Insufficient documentation

## 2013-07-05 MED ORDER — TECHNETIUM TC 99M SESTAMIBI GENERIC - CARDIOLITE
11.0000 | Freq: Once | INTRAVENOUS | Status: AC | PRN
  Administered 2013-07-05: 11 via INTRAVENOUS

## 2013-07-05 MED ORDER — TECHNETIUM TC 99M SESTAMIBI GENERIC - CARDIOLITE
33.0000 | Freq: Once | INTRAVENOUS | Status: AC | PRN
  Administered 2013-07-05: 33 via INTRAVENOUS

## 2013-07-05 MED ORDER — REGADENOSON 0.4 MG/5ML IV SOLN
0.4000 mg | Freq: Once | INTRAVENOUS | Status: AC
  Administered 2013-07-05: 0.4 mg via INTRAVENOUS

## 2013-07-05 NOTE — Progress Notes (Signed)
[facility] SITE 3 NUCLEAR MED [address] [place], [place] [address] [phone]    Cardiology Nuclear Med Study  Randall Lara is a 79 y.o. male     MRN : [id number]     DOB: [birth year]  Procedure Date: 07/05/2013  Nuclear Med Background Indication for Stress Test:  Evaluation for Ischemia History:  2011 Echo EF 65-70, A-Fibrillation Cardiac Risk Factors: Family History - CAD, History of Smoking and Hypertension  Symptoms:  Chest Pain   Nuclear Pre-Procedure Caffeine/Decaff Intake:  None NPO After: 4 pm   Lungs:  clear O2 Sat: 95% on room air. IV 0.9% NS with Angio Cath:  22g  IV Site: R Hand  IV Started by:  Bonnita Levan, RN  Chest Size (in):  50 Cup Size: n/a  Height: 6' (1.829 m)  Weight:  234 lb (106.142 kg)  BMI:  Body mass index is 31.73 kg/(m^2). Tech Comments:  N/A    Nuclear Med Study 1 or 2 day study: 1 day  Stress Test Type:  Lexiscan  Reading MD: Olga Millers, MD  Order Authorizing Provider:  Cassell Clement, MD  Resting Radionuclide: Technetium 25m Sestamibi  Resting Radionuclide Dose: 11.0 mCi   Stress Radionuclide:  Technetium 21m Sestamibi  Stress Radionuclide Dose: 33.0 mCi           Stress Protocol Rest HR: 57 Stress HR: 77  Rest BP: 105/66 Stress BP: 111/76  Exercise Time (min): n/a METS: n/a   Predicted Max HR: 139 bpm % Max HR: 55.4 bpm Rate Pressure Product: 8547   Dose of Adenosine (mg):  n/a Dose of Lexiscan: 0.4 mg  Dose of Atropine (mg): n/a Dose of Dobutamine: n/a mcg/kg/min (at max HR)  Stress Test Technologist: Bonnita Levan, RN  Nuclear Technologist:  Domenic Polite, CNMT     Rest Procedure:  Myocardial perfusion imaging was performed at rest 45 minutes following the intravenous administration of Technetium 70m Sestamibi. Rest ECG: Atrial fibrillation, RAD, CRO prior septal MI, nonspecific ST changes.  Stress Procedure:  The patient received IV Lexiscan 0.4 mg over 15-seconds.  Technetium 40m Sestamibi injected at  30-seconds.  Quantitative spect images were obtained after a 45 minute delay. Stress ECG: No significant ST segment change suggestive of ischemia.  QPS Raw Data Images:  Acquisition technically good; normal left ventricular size. Stress Images:  There is decreased uptake in the apex. Rest Images:  There is decreased uptake in the apex, less prominent compared to the stress images. Subtraction (SDS):  These findings are consistent with apical thinning and mild apical ischemia. Transient Ischemic Dilatation (Normal <1.22):  1.15 Lung/Heart Ratio (Normal <0.45):  0.41  Quantitative Gated Spect Images QGS EDV:  n/a ml QGS ESV:  n/a ml  Impression Exercise Capacity:  Lexiscan with no exercise. BP Response:  Normal blood pressure response. Clinical Symptoms:  No chest pain or dyspnea. ECG Impression:  No significant ST segment change suggestive of ischemia. Comparison with Prior Nuclear Study: No images to compare  Overall Impression:  Low risk stress nuclear study with a small, moderate intensity, partially reversible apical defect suggestive of apical thinning and mild apical ischemia.  LV Ejection Fraction: Study not gated.  LV Wall Motion:  Not gated.   Olga Millers

## 2013-07-11 ENCOUNTER — Telehealth: Payer: Self-pay | Admitting: *Deleted

## 2013-07-11 NOTE — Telephone Encounter (Signed)
Advised patient

## 2013-07-11 NOTE — Telephone Encounter (Signed)
Message copied by Burnell Blanks on Tue Jul 11, 2013  6:14 PM ------      Message from: Cassell Clement      Created: Sun Jul 09, 2013  7:05 PM       Please report. This is a low risk stress test. Mild apical thinning or ischemia. Continue same meds. ------

## 2013-07-17 ENCOUNTER — Other Ambulatory Visit: Payer: Self-pay | Admitting: General Practice

## 2013-07-17 ENCOUNTER — Ambulatory Visit (INDEPENDENT_AMBULATORY_CARE_PROVIDER_SITE_OTHER): Payer: Medicare Other | Admitting: General Practice

## 2013-07-17 DIAGNOSIS — I4891 Unspecified atrial fibrillation: Secondary | ICD-10-CM

## 2013-07-17 DIAGNOSIS — Z7901 Long term (current) use of anticoagulants: Secondary | ICD-10-CM

## 2013-07-17 LAB — POCT INR: INR: 2.5

## 2013-07-17 MED ORDER — WARFARIN SODIUM 1 MG PO TABS: ORAL_TABLET | ORAL | Status: DC

## 2013-07-17 NOTE — Progress Notes (Signed)
Pre-visit discussion using our clinic review tool. No additional management support is needed unless otherwise documented below in the visit note.  

## 2013-07-20 ENCOUNTER — Encounter: Payer: Self-pay | Admitting: Cardiology

## 2013-08-15 ENCOUNTER — Telehealth: Payer: Self-pay | Admitting: Cardiology

## 2013-08-15 NOTE — Telephone Encounter (Signed)
Patient states he is feeling good, ok to just see in about 6 months per  Dr. Mare Ferrari. Patient will call if he needs anything before then .

## 2013-08-15 NOTE — Telephone Encounter (Signed)
New Problem:   Pt is calling to find out when he needs to see Dr. Mare Ferrari next. Pt had previously set up to see Dr. Mare Ferrari for his annual check on 08/22/13. Pt last saw Dr. Mare Ferrari in December 2014.... Randall Lara wants to know if he needs to keep his 2/10 appt

## 2013-08-22 ENCOUNTER — Ambulatory Visit: Payer: Medicare Other | Admitting: Cardiology

## 2013-09-11 ENCOUNTER — Ambulatory Visit (INDEPENDENT_AMBULATORY_CARE_PROVIDER_SITE_OTHER): Payer: Medicare Other | Admitting: Family

## 2013-09-11 ENCOUNTER — Ambulatory Visit: Payer: Medicare Other

## 2013-09-11 DIAGNOSIS — Z7901 Long term (current) use of anticoagulants: Secondary | ICD-10-CM

## 2013-09-11 DIAGNOSIS — I4891 Unspecified atrial fibrillation: Secondary | ICD-10-CM

## 2013-09-11 LAB — POCT INR: INR: 2.6

## 2013-09-11 NOTE — Progress Notes (Signed)
Pre visit review using our clinic review tool, if applicable. No additional management support is needed unless otherwise documented below in the visit note. 

## 2013-09-11 NOTE — Patient Instructions (Signed)
Continue coumadin 2mg  daily except 1mg  on Sunday, Tuesday, and Thursday.  Recheck INR in 8 weeks at patient's request.   Anticoagulation Dose Instructions as of 09/11/2013     Randall Lara Tue Wed Thu Fri Sat   New Dose 1 mg 2 mg 1 mg 2 mg 1 mg 2 mg 2 mg    Description       Continue coumadin 2mg  daily except 1mg  on Sunday, Tuesday, and Thursday.  Recheck INR in 8 weeks at patient's request.

## 2013-09-22 ENCOUNTER — Telehealth: Payer: Self-pay | Admitting: Family Medicine

## 2013-09-22 NOTE — Telephone Encounter (Signed)
Patient Information:  Caller Name: Carmi  Phone: [phone]  Patient: Randall Lara, Randall Lara  Gender: Male  DOB: [birth year]  Age: 80 Years  PCP: Carolann Littler [facility])  Office Follow Up:  Does the office need to follow up with this patient?: Yes  Instructions For The Office: appt scheduling---see notes  RN Note:  Wife would like him to have soonest available appt with Dr. Elease Hashimoto to eval for depression. She realizes not likely to have appt today but hoping for an appt on Monday 3/16. Assured her will send message for MD review and someone will call her back to discuss scheduling that appt. (? 30 min slot) Also reviewed emergency and call back parameters. Agreed to plan.  Symptoms  Reason For Call & Symptoms: Some family issues upset him last week and wife concerned about depression. States he says he feels down and cannot get passed these issues. She feels he has difficulty coping and working through things and stays focused on whatever has upset him for a long period of time. Denies suicidal/homocidal thoughts or concerns. States he is able to do what he needs to each day (ADLs etc). She does feel he may be drinking more alcohol than usual as a result of being depressed/upset.  Reviewed Health History In EMR: Yes  Reviewed Medications In EMR: Yes  Reviewed Allergies In EMR: Yes  Reviewed Surgeries / Procedures: Yes  Date of Onset of Symptoms: 09/15/2013  Guideline(s) Used:  Depression  Disposition Per Guideline:   See Within 3 Days in Office  Reason For Disposition Reached:   Known or suspected alcohol or drug abuse  Advice Given:  Call Back If:  You feel like harming yourself  You become worse.  Patient Will Follow Care Advice:  YES

## 2013-09-22 NOTE — Telephone Encounter (Signed)
Per Dr. Elease Hashimoto okay to be seen on Monday 09-25-13. Pt wife informed. Pt is okay to come in and  Talk.

## 2013-09-25 ENCOUNTER — Encounter: Payer: Self-pay | Admitting: Family Medicine

## 2013-09-25 ENCOUNTER — Ambulatory Visit (INDEPENDENT_AMBULATORY_CARE_PROVIDER_SITE_OTHER): Payer: Medicare Other | Admitting: Family Medicine

## 2013-09-25 VITALS — BP 124/72 | HR 54 | Wt 239.0 lb

## 2013-09-25 DIAGNOSIS — F331 Major depressive disorder, recurrent, moderate: Secondary | ICD-10-CM

## 2013-09-25 MED ORDER — CITALOPRAM HYDROBROMIDE 20 MG PO TABS: 20.0000 mg | ORAL_TABLET | Freq: Every day | ORAL | Status: DC

## 2013-09-25 NOTE — Progress Notes (Signed)
Pre visit review using our clinic review tool, if applicable. No additional management support is needed unless otherwise documented below in the visit note. 

## 2013-09-25 NOTE — Progress Notes (Signed)
   Subjective:    Patient ID: Randall Lara, male    DOB: 02/21/1930, 80 y.o.   MRN: [id number]  HPI He is seen today accompanied by wife with some increased depressive symptoms over the past several months. He attributes some of this to the weather and wintertime. He's had some stressful interactions with his son recently. He has frequent insomnia. Low motivation. Depressed mood. Denies any suicidal ideation. Several years ago he took Celexa briefly which seemed to help his mood disorder. He takes Xanax intermittently for severe anxiety symptoms.  Past Medical History  Diagnosis Date  . Atrial fibrillation   . Joint pain   . Obesity   . Hypertension   . Anxiety   . Heart palpitations   . GERD (gastroesophageal reflux disease)    Past Surgical History  Procedure Laterality Date  . Esophagogastroduodenoscopy  03/29/2002    ZCH:YIFOYD esophagus, stomach, and duodenum through the second  portion.  Marland Kitchen Hernia repair      reports that he quit smoking about 38 years ago. His smoking use included Cigarettes. He has a 20 pack-year smoking history. He does not have any smokeless tobacco history on file. He reports that he drinks alcohol. He reports that he does not use illicit drugs. family history includes Stroke in his father, mother, and sister. There is no history of Colon cancer. Allergies  Allergen Reactions  . Ferrous Sulfate     REACTION: swelling, hives  . Sulfa Antibiotics     Reported per patient      Review of Systems  Constitutional: Negative for appetite change and unexpected weight change.  Respiratory: Negative for shortness of breath.   Cardiovascular: Negative for chest pain and palpitations.  Neurological: Negative for headaches.  Psychiatric/Behavioral: Positive for sleep disturbance, dysphoric mood and decreased concentration. Negative for suicidal ideas, hallucinations, self-injury and agitation. The patient is nervous/anxious.        Objective:   Physical Exam    Constitutional: He is oriented to person, place, and time. He appears well-developed and well-nourished.  Cardiovascular: Normal rate.  Exam reveals no gallop.   No murmur heard. Pulmonary/Chest: Effort normal and breath sounds normal. No respiratory distress. He has no wheezes. He has no rales.  Neurological: He is alert and oriented to person, place, and time. No cranial nerve deficit.  Psychiatric: He has a normal mood and affect. His behavior is normal. Judgment and thought content normal.          Assessment & Plan:  Maj. depressive episode, recurrent. PHQ-9 score is 17 which is moderately severe depression. Start back Celexa 20 mg once daily. Reassess one month. Over 25 minutes were spent with patient and his wife discussing depression issues and treatment. We have offered counseling but is not interested at this point. We have encouraged activities which have helped him in the past such as gardening.

## 2013-11-06 ENCOUNTER — Other Ambulatory Visit: Payer: Self-pay

## 2013-11-06 ENCOUNTER — Ambulatory Visit (INDEPENDENT_AMBULATORY_CARE_PROVIDER_SITE_OTHER): Payer: Medicare Other | Admitting: General Practice

## 2013-11-06 DIAGNOSIS — Z7901 Long term (current) use of anticoagulants: Secondary | ICD-10-CM

## 2013-11-06 DIAGNOSIS — I4891 Unspecified atrial fibrillation: Secondary | ICD-10-CM

## 2013-11-06 LAB — POCT INR: INR: 2.9

## 2013-11-06 MED ORDER — NYSTATIN-TRIAMCINOLONE 100000-0.1 UNIT/GM-% EX OINT: 1.0000 "application " | TOPICAL_OINTMENT | Freq: Two times a day (BID) | CUTANEOUS | Status: DC

## 2013-11-06 NOTE — Progress Notes (Signed)
Pre visit review using our clinic review tool, if applicable. No additional management support is needed unless otherwise documented below in the visit note. 

## 2013-11-10 ENCOUNTER — Telehealth: Payer: Self-pay

## 2013-11-10 ENCOUNTER — Other Ambulatory Visit: Payer: Self-pay | Admitting: Gastroenterology

## 2013-11-10 MED ORDER — ALPRAZOLAM 1 MG PO TABS: 1.0000 mg | ORAL_TABLET | Freq: Three times a day (TID) | ORAL | Status: DC | PRN

## 2013-11-10 NOTE — Telephone Encounter (Signed)
Refill for 6 months. 

## 2013-11-10 NOTE — Telephone Encounter (Signed)
RX called in .

## 2013-11-10 NOTE — Telephone Encounter (Signed)
Xanax  Last refill 05/15/13 #90 5 refills Last visit 09/25/13  [facility]

## 2013-11-14 ENCOUNTER — Encounter: Payer: Self-pay | Admitting: Family Medicine

## 2013-11-14 ENCOUNTER — Ambulatory Visit (INDEPENDENT_AMBULATORY_CARE_PROVIDER_SITE_OTHER): Payer: Medicare Other | Admitting: Family Medicine

## 2013-11-14 VITALS — BP 130/78 | HR 56 | Wt 228.0 lb

## 2013-11-14 DIAGNOSIS — R7303 Prediabetes: Secondary | ICD-10-CM | POA: Insufficient documentation

## 2013-11-14 DIAGNOSIS — I10 Essential (primary) hypertension: Secondary | ICD-10-CM

## 2013-11-14 DIAGNOSIS — F411 Generalized anxiety disorder: Secondary | ICD-10-CM

## 2013-11-14 DIAGNOSIS — I4891 Unspecified atrial fibrillation: Secondary | ICD-10-CM

## 2013-11-14 DIAGNOSIS — R7309 Other abnormal glucose: Secondary | ICD-10-CM

## 2013-11-14 LAB — BASIC METABOLIC PANEL
BUN: 15 mg/dL (ref 6–23)
CO2: 29 mEq/L (ref 19–32)
CREATININE: 1.1 mg/dL (ref 0.4–1.5)
Calcium: 9.9 mg/dL (ref 8.4–10.5)
Chloride: 103 mEq/L (ref 96–112)
GFR: 69.53 mL/min (ref >60.00)
Glucose, Bld: 102 mg/dL — ABNORMAL HIGH (ref 70–99)
Potassium: 4.1 mEq/L (ref 3.5–5.1)
Sodium: 143 mEq/L (ref 135–145)

## 2013-11-14 LAB — HEMOGLOBIN A1C: HEMOGLOBIN A1C: 6.2 % (ref 4.6–6.5)

## 2013-11-14 NOTE — Progress Notes (Signed)
Pre visit review using our clinic review tool, if applicable. No additional management support is needed unless otherwise documented below in the visit note. 

## 2013-11-14 NOTE — Progress Notes (Signed)
   Subjective:    Patient ID: Randall Lara, male    DOB: August 18, 1929, 80 y.o.   MRN: [id number]  HPI Followup multiple medical issues. Recent diagnosis of depression. PHQ-9 score was 17. We started citalopram 20 mg daily and he has had great improvement with depression and anxiety symptoms. His weight is down about 11 pounds but he states this has been due to his efforts. He is staying very active with things like gardening. He is feeling much less stressed than he did previously.  He has hypertension treated with benazepril HCTZ. He had one episode of mild orthostasis with blood pressure systolic 97 but none since then. He remains on Coumadin for atrial fibrillation. His pulse is usually around 55-60. No recent chest pains.  He has chronic anxiety and takes alprazolam which he's been on for many years  Past Medical History  Diagnosis Date  . Atrial fibrillation   . Joint pain   . Obesity   . Hypertension   . Anxiety   . Heart palpitations   . GERD (gastroesophageal reflux disease)    Past Surgical History  Procedure Laterality Date  . Esophagogastroduodenoscopy  03/29/2002    NAT:FTDDUK esophagus, stomach, and duodenum through the second  portion.  Marland Kitchen Hernia repair      reports that he quit smoking about 38 years ago. His smoking use included Cigarettes. He has a 20 pack-year smoking history. He does not have any smokeless tobacco history on file. He reports that he drinks alcohol. He reports that he does not use illicit drugs. family history includes Stroke in his father, mother, and sister. There is no history of Colon cancer. Allergies  Allergen Reactions  . Ferrous Sulfate     REACTION: swelling, hives  . Sulfa Antibiotics     Reported per patient      Review of Systems  Constitutional: Negative for fatigue.  Eyes: Negative for visual disturbance.  Respiratory: Negative for cough, chest tightness and shortness of breath.   Cardiovascular: Negative for chest pain,  palpitations and leg swelling.  Neurological: Negative for dizziness, syncope, weakness, light-headedness and headaches.       Objective:   Physical Exam  Constitutional: He is oriented to person, place, and time. He appears well-developed and well-nourished.  HENT:  Right Ear: External ear normal.  Left Ear: External ear normal.  Mouth/Throat: Oropharynx is clear and moist.  Eyes: Pupils are equal, round, and reactive to light.  Neck: Neck supple. No thyromegaly present.  Cardiovascular: Normal rate and regular rhythm.   Pulmonary/Chest: Effort normal and breath sounds normal. No respiratory distress. He has no wheezes. He has no rales.  Musculoskeletal: He exhibits no edema.  Neurological: He is alert and oriented to person, place, and time.          Assessment & Plan:  #1 hypertension. Well controlled. Continue current regimen. Check basic metabolic panel #2 atrial fibrillation. Rate controlled. Continue Coumadin which is followed in Coumadin clinic #3 history of recent depression and chronic anxiety. Improved on citalopram 20 mg once daily. #4 history prediabetes. Check blood sugar with basic metabolic panel and G2R. Hopefully improved with his recent weight loss

## 2013-11-15 ENCOUNTER — Telehealth: Payer: Self-pay | Admitting: Family Medicine

## 2013-11-15 NOTE — Telephone Encounter (Signed)
Relevant patient education mailed to patient.  

## 2013-11-30 ENCOUNTER — Encounter: Payer: Self-pay | Admitting: Family Medicine

## 2014-01-01 ENCOUNTER — Ambulatory Visit: Payer: Medicare Other

## 2014-01-01 ENCOUNTER — Ambulatory Visit (INDEPENDENT_AMBULATORY_CARE_PROVIDER_SITE_OTHER): Payer: Medicare Other | Admitting: Family

## 2014-01-01 DIAGNOSIS — I4891 Unspecified atrial fibrillation: Secondary | ICD-10-CM

## 2014-01-01 DIAGNOSIS — Z7901 Long term (current) use of anticoagulants: Secondary | ICD-10-CM

## 2014-01-01 LAB — POCT INR: INR: 2.9

## 2014-01-01 NOTE — Patient Instructions (Signed)
Continue coumadin 2mg  daily except 1mg  on Sunday, Tuesday, and Thursday.  Recheck INR in 8 weeks at patient's request.   Anticoagulation Dose Instructions as of 01/01/2014     Randall Lara Tue Wed Thu Fri Sat   New Dose 1 mg 2 mg 1 mg 2 mg 1 mg 2 mg 2 mg    Description       Continue coumadin 2mg  daily except 1mg  on Sunday, Tuesday, and Thursday.  Recheck INR in 8 weeks at patient's request.

## 2014-01-04 ENCOUNTER — Telehealth: Payer: Self-pay | Admitting: Family Medicine

## 2014-01-04 ENCOUNTER — Other Ambulatory Visit: Payer: Self-pay | Admitting: General Practice

## 2014-01-04 MED ORDER — WARFARIN SODIUM 1 MG PO TABS: ORAL_TABLET | ORAL | Status: DC

## 2014-01-04 NOTE — Telephone Encounter (Signed)
[facility], [place] - [address] is requesting re-fill on warfarin (COUMADIN) 1 MG tablet

## 2014-01-15 ENCOUNTER — Ambulatory Visit (INDEPENDENT_AMBULATORY_CARE_PROVIDER_SITE_OTHER): Payer: Medicare Other | Admitting: Family Medicine

## 2014-01-15 ENCOUNTER — Encounter: Payer: Self-pay | Admitting: Family Medicine

## 2014-01-15 VITALS — BP 130/70 | HR 58 | Wt 226.0 lb

## 2014-01-15 DIAGNOSIS — H8113 Benign paroxysmal vertigo, bilateral: Secondary | ICD-10-CM

## 2014-01-15 DIAGNOSIS — H8110 Benign paroxysmal vertigo, unspecified ear: Secondary | ICD-10-CM

## 2014-01-15 MED ORDER — MECLIZINE HCL 12.5 MG PO TABS: 12.5000 mg | ORAL_TABLET | Freq: Three times a day (TID) | ORAL | Status: DC | PRN

## 2014-01-15 NOTE — Patient Instructions (Signed)
Benign Positional Vertigo Vertigo means you feel like you or your surroundings are moving when they are not. Benign positional vertigo is the most common form of vertigo. Benign means that the cause of your condition is not serious. Benign positional vertigo is more common in older adults. CAUSES  Benign positional vertigo is the result of an upset in the labyrinth system. This is an area in the middle ear that helps control your balance. This may be caused by a viral infection, head injury, or repetitive motion. However, often no specific cause is found. SYMPTOMS  Symptoms of benign positional vertigo occur when you move your head or eyes in different directions. Some of the symptoms may include:  Loss of balance and falls.  Vomiting.  Blurred vision.  Dizziness.  Nausea.  Involuntary eye movements (nystagmus). DIAGNOSIS  Benign positional vertigo is usually diagnosed by physical exam. If the specific cause of your benign positional vertigo is unknown, your caregiver may perform imaging tests, such as magnetic resonance imaging (MRI) or computed tomography (CT). TREATMENT  Your caregiver may recommend movements or procedures to correct the benign positional vertigo. Medicines such as meclizine, benzodiazepines, and medicines for nausea may be used to treat your symptoms. In rare cases, if your symptoms are caused by certain conditions that affect the inner ear, you may need surgery. HOME CARE INSTRUCTIONS   Follow your caregiver's instructions.  Move slowly. Do not make sudden body or head movements.  Avoid driving.  Avoid operating heavy machinery.  Avoid performing any tasks that would be dangerous to you or others during a vertigo episode.  Drink enough fluids to keep your urine clear or pale yellow. SEEK IMMEDIATE MEDICAL CARE IF:   You develop problems with walking, weakness, numbness, or using your arms, hands, or legs.  You have difficulty speaking.  You develop  severe headaches.  Your nausea or vomiting continues or gets worse.  You develop visual changes.  Your family or friends notice any behavioral changes.  Your condition gets worse.  You have a fever.  You develop a stiff neck or sensitivity to light. MAKE SURE YOU:   Understand these instructions.  Will watch your condition.  Will get help right away if you are not doing well or get worse. Document Released: 04/06/2006 Document Revised: 09/21/2011 Document Reviewed: 03/19/2011 Exit[employer] Patient Information 2015 [employer]. This information is not intended to replace advice given to you by your health care provider. Make sure you discuss any questions you have with your health care provider.    

## 2014-01-15 NOTE — Progress Notes (Signed)
   Subjective:    Patient ID: Randall Lara, male    DOB: [birth year], 80 y.o.   MRN: [id number]  Dizziness Pertinent negatives include no abdominal pain, chest pain, chills, fever, headaches or weakness.   Acute visit for dizziness. On further description, sounds like vertigo. He had symptoms for about a week now. Symptoms tend to occur when he moves his head to either side. He has some chronic hearing loss which is unchanged. No recent fever. No ataxia. He has hypertension which is been well controlled. No orthostatic symptoms. No chest pains. Symptoms can occur throughout the day but wax and wane. He has not had any headache. No fevers or chills. No alleviating factors.  Generally very active. No recent head injury. No recent falls.  Past Medical History  Diagnosis Date  . Atrial fibrillation   . Joint pain   . Obesity   . Hypertension   . Anxiety   . Heart palpitations   . GERD (gastroesophageal reflux disease)    Past Surgical History  Procedure Laterality Date  . Esophagogastroduodenoscopy  03/29/2002    [id number] esophagus, stomach, and duodenum through the second  portion.  Marland Kitchen Hernia repair      reports that he quit smoking about 38 years ago. His smoking use included Cigarettes. He has a 20 pack-year smoking history. He does not have any smokeless tobacco history on file. He reports that he drinks alcohol. He reports that he does not use illicit drugs. family history includes Stroke in his father, mother, and sister. There is no history of Colon cancer. Allergies  Allergen Reactions  . Ferrous Sulfate     REACTION: swelling, hives  . Sulfa Antibiotics     Reported per patient      Review of Systems  Constitutional: Negative for fever and chills.  Respiratory: Negative for shortness of breath.   Cardiovascular: Negative for chest pain.  Gastrointestinal: Negative for abdominal pain.  Neurological: Positive for dizziness. Negative for seizures, syncope, weakness and  headaches.  Psychiatric/Behavioral: Negative for confusion. The patient is nervous/anxious.        Objective:   Physical Exam  Constitutional: He is oriented to person, place, and time. He appears well-developed and well-nourished. No distress.  HENT:  Right Ear: External ear normal.  Left Ear: External ear normal.  Cardiovascular: Normal rate and regular rhythm.  Exam reveals no gallop.   Pulmonary/Chest: Effort normal and breath sounds normal. No respiratory distress. He has no wheezes. He has no rales.  Neurological: He is alert and oriented to person, place, and time. No cranial nerve deficit. Coordination normal.  No focal strength deficits. Ambulating without difficulty. Normal finger to nose testing  Psychiatric: He has a normal mood and affect.          Assessment & Plan:  Dizziness. Probably benign peripheral positional vertigo. Nonfocal exam. We explained medications are usually not very helpful in treating this. Consider vestibular rehabilitation if no better in one week. Discussed safety issues to reduce risk of falling. Low-dose meclizine 12.5 mg every 6 hours for any nausea or severe symptoms

## 2014-01-15 NOTE — Progress Notes (Signed)
Pre visit review using our clinic review tool, if applicable. No additional management support is needed unless otherwise documented below in the visit note. 

## 2014-02-22 ENCOUNTER — Ambulatory Visit (INDEPENDENT_AMBULATORY_CARE_PROVIDER_SITE_OTHER): Payer: Medicare Other | Admitting: Cardiology

## 2014-02-22 ENCOUNTER — Encounter: Payer: Self-pay | Admitting: Cardiology

## 2014-02-22 VITALS — BP 130/65 | HR 55 | Ht 72.0 in | Wt 219.0 lb

## 2014-02-22 DIAGNOSIS — I4891 Unspecified atrial fibrillation: Secondary | ICD-10-CM

## 2014-02-22 DIAGNOSIS — R079 Chest pain, unspecified: Secondary | ICD-10-CM

## 2014-02-22 DIAGNOSIS — I10 Essential (primary) hypertension: Secondary | ICD-10-CM

## 2014-02-22 DIAGNOSIS — I119 Hypertensive heart disease without heart failure: Secondary | ICD-10-CM

## 2014-02-22 DIAGNOSIS — I482 Chronic atrial fibrillation, unspecified: Secondary | ICD-10-CM

## 2014-02-22 LAB — BASIC METABOLIC PANEL
BUN: 18 mg/dL (ref 6–23)
CALCIUM: 9.6 mg/dL (ref 8.4–10.5)
CO2: 29 mEq/L (ref 19–32)
CREATININE: 1 mg/dL (ref 0.4–1.5)
Chloride: 102 mEq/L (ref 96–112)
GFR: 76.83 mL/min (ref >60.00)
GLUCOSE: 87 mg/dL (ref 70–99)
POTASSIUM: 3.7 meq/L (ref 3.5–5.1)
Sodium: 138 mEq/L (ref 135–145)

## 2014-02-22 LAB — CBC WITH DIFFERENTIAL/PLATELET
BASOS ABS: 0 10*3/uL (ref 0.0–0.1)
Basophils Relative: 0.4 % (ref 0.0–3.0)
EOS PCT: 1 % (ref 0.0–5.0)
Eosinophils Absolute: 0 10*3/uL (ref 0.0–0.7)
HCT: 40.8 % (ref 39.0–52.0)
Hemoglobin: 13.9 g/dL (ref 13.0–17.0)
LYMPHS ABS: 1.8 10*3/uL (ref 0.7–4.0)
LYMPHS PCT: 36.7 % (ref 12.0–46.0)
MCHC: 34 g/dL (ref 30.0–36.0)
MCV: 95.6 fl (ref 78.0–100.0)
Monocytes Absolute: 0.6 10*3/uL (ref 0.1–1.0)
Monocytes Relative: 11.4 % (ref 3.0–12.0)
Neutro Abs: 2.5 10*3/uL (ref 1.4–7.7)
Neutrophils Relative %: 50.5 % (ref 43.0–77.0)
PLATELETS: 120 10*3/uL — AB (ref 150.0–400.0)
RBC: 4.26 Mil/uL (ref 4.22–5.81)
RDW: 13.4 % (ref 11.5–15.5)
WBC: 4.9 10*3/uL (ref 4.0–10.5)

## 2014-02-22 LAB — PROTIME-INR
INR: 2.4 ratio — AB (ref 0.8–1.0)
Prothrombin Time: 25.5 s — ABNORMAL HIGH (ref 9.6–13.1)

## 2014-02-22 MED ORDER — APIXABAN 5 MG PO TABS: 5.0000 mg | ORAL_TABLET | Freq: Two times a day (BID) | ORAL | Status: DC

## 2014-02-22 NOTE — Assessment & Plan Note (Signed)
The patient has not had any further chest discomfort or angina

## 2014-02-22 NOTE — Patient Instructions (Addendum)
Will obtain labs today and call you with the results (bmet/cbc/pt/inr)  STOP YOUR WARFARIN TODAY  START ELIQUIS 5 MG TWICE A DAY ON Saturday (AFTER 2 DAYS WITHOUT WARFARIN)  Your physician wants you to follow-up in: 6 month ov/bmet/cbc/ekg You will receive a reminder letter in the mail two months in advance. If you don't receive a letter, please call our office to schedule the follow-up appointment.

## 2014-02-22 NOTE — Progress Notes (Signed)
[name] Date of Birth:  1930/02/06 [place] [address] [place], [place]  [address] [phone]        Fax   [phone]   History of Present Illness:  This pleasant 80 year old gentleman is seen for a followup office visit. He has a history of established chronic asymptomatic atrial fibrillation. He is on Coumadin  He has had no thromboembolic events. He does not have any history of known ischemic heart disease.  In Dec 2014 he was seen as a work in because of some exertional chest tightness.  He had a nuclear stress test on 07/07/13 which was low risk .  He is on warfarin but is asking whether he should be switched to Eliquis.  Current Outpatient Prescriptions  Medication Sig Dispense Refill  . ALPRAZolam (XANAX) 1 MG tablet Take 1 tablet (1 mg total) by mouth 3 (three) times daily as needed.  90 tablet  5  . benazepril-hydrochlorthiazide (LOTENSIN HCT) 20-12.5 MG per tablet Take 1 tablet by mouth daily.  90 tablet  3  . citalopram (CELEXA) 20 MG tablet Take 1 tablet (20 mg total) by mouth daily.  30 tablet  5  . nitroGLYCERIN (NITROSTAT) 0.4 MG SL tablet Place 1 tablet (0.4 mg total) under the tongue every 5 (five) minutes as needed for chest pain.  25 tablet  11  . pantoprazole (PROTONIX) 40 MG tablet TAKE 1 TABLET BY MOUTH EVERY DAY  30 tablet  11  . apixaban (ELIQUIS) 5 MG TABS tablet Take 1 tablet (5 mg total) by mouth 2 (two) times daily.  60 tablet  5   No current facility-administered medications for this visit.    Allergies  Allergen Reactions  . Ferrous Sulfate     REACTION: swelling, hives  . Sulfa Antibiotics     Reported per patient    Patient Active Problem List   Diagnosis Date Noted  . Atrial fibrillation     Priority: High  . Heart palpitations     Priority: Medium  . HYPERTENSION 02/11/2009    Priority: Medium  . Prediabetes 11/14/2013  . Chest pain 06/16/2013  . Obesity (BMI 30-39.9) 05/15/2013  . Globus sensation  12/01/2012  . GERD (gastroesophageal reflux disease) 11/10/2012  . Encounter for long-term (current) use of anticoagulants 10/20/2010  . Joint pain   . Obesity   . HYPERGLYCEMIA 05/08/2010  . ANXIETY 02/11/2009    History  Smoking status  . Former Smoker -- 1.00 packs/day for 20 years  . Types: Cigarettes  . Quit date: 08/07/1975  Smokeless tobacco  . Not on file    History  Alcohol Use  . Yes    Comment: a few drinks of liquor daily    Family History  Problem Relation Age of Onset  . Stroke Mother   . Stroke Father   . Stroke Sister   . Colon cancer Neg Hx     Review of Systems: Constitutional: no fever chills diaphoresis or fatigue or change in weight.  Head and neck: no hearing loss, no epistaxis, no photophobia or visual disturbance. Respiratory: No cough, shortness of breath or wheezing. Cardiovascular: No chest pain peripheral edema, palpitations. Gastrointestinal: No abdominal distention, no abdominal pain, no change in bowel habits hematochezia or melena. Genitourinary: No dysuria, no frequency, no urgency, no nocturia. Musculoskeletal:No arthralgias, no back pain, no gait disturbance or myalgias. Neurological: No dizziness, no headaches, no numbness, no seizures, no syncope, no weakness, no tremors. Hematologic: No  lymphadenopathy, no easy bruising. Psychiatric: No confusion, no hallucinations, no sleep disturbance.    Physical Exam: Filed Vitals:   02/22/14 1004  BP: 130/65  Pulse: 55   the general appearance reveals a well-developed large gentleman in no distress.The head and neck exam reveals pupils equal and reactive.  Extraocular movements are full.  There is no scleral icterus.  The mouth and pharynx are normal.  The neck is supple.  The carotids reveal no bruits.  The jugular venous pressure is normal.  The  thyroid is not enlarged.  There is no lymphadenopathy.  The chest is clear to percussion and auscultation.  There are no rales or rhonchi.   Expansion of the chest is symmetrical.  The precordium is quiet.  The pulse is irregularly irregular  The first heart sound is normal.  The second heart sound is physiologically split.  There is no murmur gallop rub or click.  There is no abnormal lift or heave.  The abdomen is soft and nontender.  The bowel sounds are normal.  The liver and spleen are not enlarged.  There are no abdominal masses.  There are no abdominal bruits.  Extremities reveal good pedal pulses.  There is no phlebitis or edema.  There is no cyanosis or clubbing.  Strength is normal and symmetrical in all extremities.  There is no lateralizing weakness.  There are no sensory deficits.  The skin is warm and dry.  There is no rash.     Assessment / Plan: 1.  Atrial fibrillation, chronic 2. essential hypertension  Plan: The patient would like to switch to apixaban.  Recheck his renal function which is normal with a creatinine of 1.0.  The proper dose for him is 5 mg twice a day.  He will omit his warfarin for 2 days and then start apixaban.  We will have him return to the anticoagulation clinic in one month to talk with [facility].D. about apixaban.  He'll return in 6 months for an office visit here.  We emphasized how important it is for him not to miss any doses of his apixaban.

## 2014-02-22 NOTE — Assessment & Plan Note (Signed)
The patient is not aware of his heart rate .  He has not had any TIA symptoms.

## 2014-02-22 NOTE — Assessment & Plan Note (Signed)
The patient has not had any symptoms of congestive heart failure.  He has good exercise tolerance

## 2014-02-23 ENCOUNTER — Telehealth: Payer: Self-pay | Admitting: Cardiology

## 2014-02-23 NOTE — Progress Notes (Signed)
Patient ID: Randall Lara, male   DOB: [birth year], 80 y.o.   MRN: [id number] Patient PA  was approved  For eliquis through 02/23/15 ID# [id number]

## 2014-02-23 NOTE — Telephone Encounter (Signed)
Spoke with [employer] and was told that an exception had already been denied. Left message on patients home number

## 2014-02-23 NOTE — Telephone Encounter (Signed)
New problem    [employer] need tier exception for pt Eliquis please call [phone].

## 2014-02-23 NOTE — Progress Notes (Signed)
Quick Note:  Please report to patient. The recent labs are stable. Continue same medication and careful diet. ______ 

## 2014-02-26 ENCOUNTER — Ambulatory Visit: Payer: Medicare Other

## 2014-02-28 ENCOUNTER — Ambulatory Visit: Payer: Medicare Other | Admitting: Family

## 2014-03-05 ENCOUNTER — Other Ambulatory Visit: Payer: Self-pay | Admitting: Cardiology

## 2014-03-22 ENCOUNTER — Encounter: Payer: Self-pay | Admitting: Internal Medicine

## 2014-03-26 ENCOUNTER — Ambulatory Visit (INDEPENDENT_AMBULATORY_CARE_PROVIDER_SITE_OTHER): Payer: Medicare Other

## 2014-03-26 DIAGNOSIS — Z5181 Encounter for therapeutic drug level monitoring: Secondary | ICD-10-CM

## 2014-03-26 DIAGNOSIS — I4891 Unspecified atrial fibrillation: Secondary | ICD-10-CM

## 2014-03-26 LAB — CBC
HEMATOCRIT: 39.8 % (ref 39.0–52.0)
HEMOGLOBIN: 13.3 g/dL (ref 13.0–17.0)
MCHC: 33.4 g/dL (ref 30.0–36.0)
MCV: 95.1 fl (ref 78.0–100.0)
Platelets: 103 10*3/uL — ABNORMAL LOW (ref 150.0–400.0)
RBC: 4.19 Mil/uL — AB (ref 4.22–5.81)
RDW: 13.1 % (ref 11.5–15.5)
WBC: 4.4 10*3/uL (ref 4.0–10.5)

## 2014-03-26 LAB — BASIC METABOLIC PANEL
BUN: 17 mg/dL (ref 6–23)
CO2: 27 mEq/L (ref 19–32)
Calcium: 9.1 mg/dL (ref 8.4–10.5)
Chloride: 103 mEq/L (ref 96–112)
Creatinine, Ser: 0.9 mg/dL (ref 0.4–1.5)
GFR: 81.54 mL/min (ref >60.00)
Glucose, Bld: 98 mg/dL (ref 70–99)
Potassium: 3.5 mEq/L (ref 3.5–5.1)
Sodium: 139 mEq/L (ref 135–145)

## 2014-03-26 NOTE — Progress Notes (Signed)
Pt was started on Eliquis 5mg  BID by Dr Mare Ferrari for afib on 02/22/2014.    Reviewed patients medication list.  Pt is not currently on any combined P-gp and strong CYP3A4 inhibitors/inducers (ketoconazole, traconazole, ritonavir, carbamazepine, phenytoin, rifampin, [place]'s wort).  Reviewed labs.  SCr 0.9, Weight 99.3kg, Age 80.  Dose is appropriate based on specified criteria.   Hgb 13.3 and HCT 39.8 03/27/2014  Spoke with pt and informed of labs and that he is on the correct dose of Eliquis and instructed to see Dr Mare Ferrari in 6 months and to have repeat cbc and bmet done at that time. Pt understands that must have Hgb and Hct and SrCr done every 6 months while on Eliquis and that he is to follow up with Dr Mare Ferrari regarding this. This nurse informed Rip Harbour Dr Sherryl Barters nurse that we are discharging him at this time

## 2014-03-26 NOTE — Patient Instructions (Signed)

## 2014-04-09 ENCOUNTER — Other Ambulatory Visit: Payer: Self-pay | Admitting: Cardiology

## 2014-05-11 ENCOUNTER — Ambulatory Visit (INDEPENDENT_AMBULATORY_CARE_PROVIDER_SITE_OTHER): Payer: Medicare Other | Admitting: Internal Medicine

## 2014-05-11 ENCOUNTER — Encounter: Payer: Self-pay | Admitting: Internal Medicine

## 2014-05-11 VITALS — BP 117/78 | HR 50 | Temp 97.0°F | Ht 72.0 in | Wt 211.0 lb

## 2014-05-11 DIAGNOSIS — K219 Gastro-esophageal reflux disease without esophagitis: Secondary | ICD-10-CM

## 2014-05-11 NOTE — Progress Notes (Signed)
Primary Care Physician:  [name], MD Primary Gastroenterologist:  Dr. Gala Romney  Pre-Procedure History & Physical: HPI:  Randall Lara is a 80 y.o. male here for followup of GERD. Doing very well on Protonix 40 mg daily. He says he stopped consuming alcohol any form about 3-4 months ago and this helped he is also been eating more healthfully and has lost about 20 pounds. He is now Eliquis given atrial fibrillation.  Past Medical History  Diagnosis Date  . Atrial fibrillation   . Joint pain   . Obesity   . Hypertension   . Anxiety   . Heart palpitations   . GERD (gastroesophageal reflux disease)     Past Surgical History  Procedure Laterality Date  . Esophagogastroduodenoscopy  03/29/2002    [id number] esophagus, stomach, and duodenum through the second  portion.  Marland Kitchen Hernia repair      Prior to Admission medications   Medication Sig Start Date End Date Taking? Authorizing Provider  ALPRAZolam Duanne Moron) 1 MG tablet Take 1 tablet (1 mg total) by mouth 3 (three) times daily as needed. 11/10/13  Yes [name], MD  benazepril-hydrochlorthiazide (LOTENSIN HCT) 20-12.5 MG per tablet Take 1 tablet by mouth daily. 05/15/13  Yes [name], MD  citalopram (CELEXA) 20 MG tablet Take 1 tablet (20 mg total) by mouth daily. 09/25/13  Yes [name], MD  ELIQUIS 5 MG TABS tablet TAKE ONE TABLET BY MOUTH TWICE DAILY 04/10/14  Yes Darlin Coco, MD  nitroGLYCERIN (NITROSTAT) 0.4 MG SL tablet Place 1 tablet (0.4 mg total) under the tongue every 5 (five) minutes as needed for chest pain. 06/16/13  Yes Darlin Coco, MD  pantoprazole ([place]) 40 MG tablet TAKE 1 TABLET BY MOUTH EVERY DAY   Yes Orvil Feil, NP    Allergies as of 05/11/2014 - Review Complete 05/11/2014  Allergen Reaction Noted  . Ferrous sulfate  11/11/2009  . Sulfa antibiotics  08/18/2011    Family History  Problem Relation Age of Onset  . Stroke Mother   . Stroke Father   . Stroke Sister   .  Colon cancer Neg Hx     History   Social History  . Marital Status: Married    Spouse Name: N/A    Number of Children: N/A  . Years of Education: N/A   Occupational History  . Not on file.   Social History Main Topics  . Smoking status: Former Smoker -- 1.00 packs/day for 20 years    Types: Cigarettes    Quit date: 08/07/1975  . Smokeless tobacco: Not on file  . Alcohol Use: Yes     Comment: a few drinks of liquor daily  . Drug Use: No  . Sexual Activity: Not on file   Other Topics Concern  . Not on file   Social History Narrative  . No narrative on file    Review of Systems: See HPI, otherwise negative ROS  Physical Exam: BP 117/78  Pulse 50  Temp(Src) 97 F (36.1 C) (Oral)  Ht 6' (1.829 m)  Wt 211 lb (95.709 kg)  BMI 28.61 kg/m2 General:   Alert,  Well-developed, well-nourished, pleasant and cooperative in NAD. Currently by his wife. Detailed examination deferred.    Impression:   Very active, pleasant 80 year old gentleman long-standing GERD well-controlled on Protonix 40 mg daily. Now adopting a more healthy lifestyle which is certainly helping. He's not having any dysphagia. He is doing very well from a GI standpoint.  Recommendations:Continue Protonix 40 mg daily  GERD information  Office visit in 1 year   Notice: This dictation was prepared with Dragon dictation along with smaller phrase technology. Any transcriptional errors that result from this process are unintentional and may not be corrected upon review.

## 2014-05-11 NOTE — Patient Instructions (Signed)
Continue Protonix 40 mg daily  GERD information  Office visit in 1 year

## 2014-05-15 ENCOUNTER — Ambulatory Visit (INDEPENDENT_AMBULATORY_CARE_PROVIDER_SITE_OTHER): Payer: Medicare Other | Admitting: Family Medicine

## 2014-05-15 ENCOUNTER — Encounter: Payer: Self-pay | Admitting: Family Medicine

## 2014-05-15 VITALS — BP 118/68 | HR 51 | Temp 97.3°F | Wt 213.0 lb

## 2014-05-15 DIAGNOSIS — Z8679 Personal history of other diseases of the circulatory system: Secondary | ICD-10-CM | POA: Insufficient documentation

## 2014-05-15 DIAGNOSIS — F411 Generalized anxiety disorder: Secondary | ICD-10-CM

## 2014-05-15 DIAGNOSIS — I10 Essential (primary) hypertension: Secondary | ICD-10-CM

## 2014-05-15 DIAGNOSIS — L820 Inflamed seborrheic keratosis: Secondary | ICD-10-CM

## 2014-05-15 DIAGNOSIS — Z23 Encounter for immunization: Secondary | ICD-10-CM

## 2014-05-15 MED ORDER — CITALOPRAM HYDROBROMIDE 20 MG PO TABS: 20.0000 mg | ORAL_TABLET | Freq: Every day | ORAL | Status: DC

## 2014-05-15 MED ORDER — BENAZEPRIL-HYDROCHLOROTHIAZIDE 20-12.5 MG PO TABS: 1.0000 | ORAL_TABLET | Freq: Every day | ORAL | Status: DC

## 2014-05-15 MED ORDER — ALPRAZOLAM 1 MG PO TABS: 1.0000 mg | ORAL_TABLET | Freq: Two times a day (BID) | ORAL | Status: DC | PRN

## 2014-05-15 NOTE — Progress Notes (Signed)
Subjective:    Patient ID: Randall Lara, male    DOB: 1930/06/26, 80 y.o.   MRN: [id number]  HPI Patient seen for the following:  Hypertension treated with benazepril HCTZ. He has lost weight due to his efforts during the past several months and overall feels great. No dizziness. He has reduced his benazepril HCTZ to one half tablet daily and blood pressure remains well controlled. No recent chest pains.  History of intermittent atrial fibrillation. He takes blood thinner with Eliquis and he is followed by cardiology. No recent bleeding complications.  Chronic anxiety. We added Celexa last year and he has seen improvement in depressed mood and anxiety with this. He does remain on alprazolam which he has been on for many years and we strongly advocated he try to scale back and has reduced this to 2 times daily. He has history of GERD and has not been taking protonix regularly but no recent breakthrough symptoms.  Irritated seborrheic keratosis right chest wall. Patient requesting treatment. Sometimes rubs against clothing.  Past Medical History  Diagnosis Date  . Atrial fibrillation   . Joint pain   . Obesity   . Hypertension   . Anxiety   . Heart palpitations   . GERD (gastroesophageal reflux disease)    Past Surgical History  Procedure Laterality Date  . Esophagogastroduodenoscopy  03/29/2002    [id number] esophagus, stomach, and duodenum through the second  portion.  Marland Kitchen Hernia repair      reports that he quit smoking about 38 years ago. His smoking use included Cigarettes. He has a 20 pack-year smoking history. He does not have any smokeless tobacco history on file. He reports that he drinks alcohol. He reports that he does not use illicit drugs. family history includes Stroke in his father, mother, and sister. There is no history of Colon cancer. Allergies  Allergen Reactions  . Ferrous Sulfate     REACTION: swelling, hives  . Sulfa Antibiotics     Reported per patient       Review of Systems  Constitutional: Negative for appetite change, fatigue and unexpected weight change.  Eyes: Negative for visual disturbance.  Respiratory: Negative for cough, chest tightness and shortness of breath.   Cardiovascular: Negative for chest pain, palpitations and leg swelling.  Gastrointestinal: Negative for abdominal pain.  Endocrine: Negative for polydipsia and polyuria.  Genitourinary: Negative for dysuria.  Neurological: Negative for dizziness, syncope, weakness, light-headedness and headaches.  Psychiatric/Behavioral: Negative for dysphoric mood.       Objective:   Physical Exam  Constitutional: He is oriented to person, place, and time. He appears well-developed and well-nourished.  HENT:  Right Ear: External ear normal.  Left Ear: External ear normal.  Mouth/Throat: Oropharynx is clear and moist.  Eyes: Pupils are equal, round, and reactive to light.  Neck: Neck supple. No thyromegaly present.  Cardiovascular: Normal rate and regular rhythm.   Pulmonary/Chest: Effort normal and breath sounds normal. No respiratory distress. He has no wheezes. He has no rales.  Musculoskeletal: He exhibits no edema.  Neurological: He is alert and oriented to person, place, and time.  Skin:  Right upper anterior chest wall reveals well-demarcated brownish lesion about 5 to 6 mm diameter.no erythema.          Assessment & Plan:  #1 hypertension which is at goal and stable. Recent basic metabolic panel obtained through cardiology office and results reviewed #2 chronic anxiety. Refill citalopram one daily. Continue try to scale back alprazolam use.  Refill given # 3 GERD stable. Avoid daily proton pump inhibitor if tolerates tapering back. #4 irritated seborrheic keratosis right chest wall. Reviewed risk and benefits of treatment with cryotherapy patient consented. Patient tolerated well #5 health maintenance. Prevnar 13 given. Already had flu vaccine.

## 2014-05-15 NOTE — Progress Notes (Signed)
Pre visit review using our clinic review tool, if applicable. No additional management support is needed unless otherwise documented below in the visit note. 

## 2014-06-05 ENCOUNTER — Telehealth: Payer: Self-pay | Admitting: *Deleted

## 2014-06-05 NOTE — Telephone Encounter (Signed)
Heather from dentist office, Dr. Deloria Lair, calling stating pt is in chair and needs either a tooth extraction or extensive dental work.  Pt tells them that he is on a blood thinner. They need to know if can continue or reschedule. Advised her that pt not on coumadin but is on Eliquis and would speak w/Dr. Mare Ferrari for advise.  Dr. Mare Ferrari states he can be off Eliquis for 2 days. Notified Nira Conn that could not have any procedures today and he needed to be off Eliquis 2 days prior to procedure.  She verbalizes understanding and will notify pt.

## 2014-08-02 ENCOUNTER — Other Ambulatory Visit: Payer: Self-pay | Admitting: Cardiology

## 2014-08-22 ENCOUNTER — Encounter: Payer: Self-pay | Admitting: Cardiology

## 2014-08-23 ENCOUNTER — Ambulatory Visit (INDEPENDENT_AMBULATORY_CARE_PROVIDER_SITE_OTHER): Payer: Medicare Other | Admitting: Cardiology

## 2014-08-23 ENCOUNTER — Other Ambulatory Visit (INDEPENDENT_AMBULATORY_CARE_PROVIDER_SITE_OTHER): Payer: Medicare Other | Admitting: *Deleted

## 2014-08-23 ENCOUNTER — Encounter: Payer: Self-pay | Admitting: Cardiology

## 2014-08-23 VITALS — BP 112/76 | HR 44 | Ht 72.0 in | Wt 202.0 lb

## 2014-08-23 DIAGNOSIS — I359 Nonrheumatic aortic valve disorder, unspecified: Secondary | ICD-10-CM | POA: Diagnosis not present

## 2014-08-23 DIAGNOSIS — I482 Chronic atrial fibrillation, unspecified: Secondary | ICD-10-CM

## 2014-08-23 DIAGNOSIS — I10 Essential (primary) hypertension: Secondary | ICD-10-CM | POA: Diagnosis not present

## 2014-08-23 DIAGNOSIS — M170 Bilateral primary osteoarthritis of knee: Secondary | ICD-10-CM | POA: Insufficient documentation

## 2014-08-23 LAB — CBC WITH DIFFERENTIAL/PLATELET
BASOS PCT: 0.5 % (ref 0.0–3.0)
Basophils Absolute: 0 10*3/uL (ref 0.0–0.1)
EOS ABS: 0.1 10*3/uL (ref 0.0–0.7)
Eosinophils Relative: 1.8 % (ref 0.0–5.0)
HEMATOCRIT: 40.8 % (ref 39.0–52.0)
HEMOGLOBIN: 14.2 g/dL (ref 13.0–17.0)
LYMPHS ABS: 1.7 10*3/uL (ref 0.7–4.0)
Lymphocytes Relative: 37.5 % (ref 12.0–46.0)
MCHC: 34.8 g/dL (ref 30.0–36.0)
MCV: 90.3 fl (ref 78.0–100.0)
MONO ABS: 0.5 10*3/uL (ref 0.1–1.0)
Monocytes Relative: 10.7 % (ref 3.0–12.0)
NEUTROS ABS: 2.3 10*3/uL (ref 1.4–7.7)
Neutrophils Relative %: 49.5 % (ref 43.0–77.0)
Platelets: 120 10*3/uL — ABNORMAL LOW (ref 150.0–400.0)
RBC: 4.52 Mil/uL (ref 4.22–5.81)
RDW: 14 % (ref 11.5–15.5)
WBC: 4.7 10*3/uL (ref 4.0–10.5)

## 2014-08-23 LAB — BASIC METABOLIC PANEL
BUN: 15 mg/dL (ref 6–23)
CALCIUM: 9.7 mg/dL (ref 8.4–10.5)
CO2: 32 mEq/L (ref 19–32)
CREATININE: 1.01 mg/dL (ref 0.40–1.50)
Chloride: 102 mEq/L (ref 96–112)
GFR: 74.98 mL/min (ref >60.00)
Glucose, Bld: 106 mg/dL — ABNORMAL HIGH (ref 70–99)
Potassium: 3.9 mEq/L (ref 3.5–5.1)
SODIUM: 139 meq/L (ref 135–145)

## 2014-08-23 NOTE — Progress Notes (Signed)
Cardiology Office Note   Date:  08/23/2014   ID:  [name], DOB 1930-03-01, MRN [id number]  PCP:  Randall Post, MD  Cardiologist:   Darlin Coco, MD   No chief complaint on file.     History of Present Illness: Randall Lara is a 81 y.o. male who presents for follow-up office visit.  This pleasant 81 year old gentleman is seen for a followup office visit. He has a history of established chronic asymptomatic atrial fibrillation. He is on Coumadin He has had no thromboembolic events. He does not have any history of known ischemic heart disease. In Dec 2014 he was seen as a work in because of some exertional chest tightness. He had a nuclear stress test on 07/07/13 which was low risk .At his last visit he was switched from warfarin to Eliquis. Since last visit he has continued to feel well.  He is active working around his house and garden.   he has not been having any chest pain.  He has not had to take any nitroglycerin.  He is not having any dizziness or syncope.  He is a chronically slow response to his atrial fibrillation. He has a known heart murmur.  Echocardiogram 07/01/10 showed LVH with ejection fraction 65-70% and mild aortic insufficiency.  There was mild thickening of the aortic valve.  Not having any symptoms from his aortic valve.  Past Medical History  Diagnosis Date  . Atrial fibrillation   . Joint pain   . Obesity   . Hypertension   . Anxiety   . Heart palpitations   . GERD (gastroesophageal reflux disease)     Past Surgical History  Procedure Laterality Date  . Esophagogastroduodenoscopy  03/29/2002    MGQ:QPYPPJ esophagus, stomach, and duodenum through the second  portion.  Marland Kitchen Hernia repair       Current Outpatient Prescriptions  Medication Sig Dispense Refill  . ALPRAZolam (XANAX) 1 MG tablet Take 1 tablet (1 mg total) by mouth 2 (two) times daily as needed. 60 tablet 5  . benazepril-hydrochlorthiazide (LOTENSIN HCT) 20-12.5 MG per tablet  Take 1 tablet by mouth daily. 90 tablet 3  . citalopram (CELEXA) 20 MG tablet Take 1 tablet (20 mg total) by mouth daily. 90 tablet 3  . ELIQUIS 5 MG TABS tablet TAKE ONE TABLET BY MOUTH TWICE DAILY 60 tablet 1  . nitroGLYCERIN (NITROSTAT) 0.4 MG SL tablet Place 1 tablet (0.4 mg total) under the tongue every 5 (five) minutes as needed for chest pain. 25 tablet 11  . pantoprazole (PROTONIX) 40 MG tablet TAKE 1 TABLET BY MOUTH EVERY DAY 30 tablet 11   No current facility-administered medications for this visit.    Allergies:   Ferrous sulfate and Sulfa antibiotics   Social History:  The patient  reports that he quit smoking about 39 years ago. His smoking use included Cigarettes. He has a 20 pack-year smoking history. He does not have any smokeless tobacco history on file. He reports that he drinks alcohol. He reports that he does not use illicit drugs.   Family History:  The patient's family history includes Stroke in his father, mother, and sister. There is no history of Colon cancer.    ROS:  Please see the history of present illness.   Otherwise, review of systems are positive for none.   All other systems are reviewed and negative.    PHYSICAL EXAM: VS:  BP 112/76 mmHg  Pulse 44  Ht 6' (1.829 m)  [facility]  202 lb (91.627 kg)  BMI 27.39 kg/m2 , BMI Body mass index is 27.39 kg/(m^2). GEN: Well nourished, well developed, in no acute distress HEENT: normal Neck: no JVD, carotid bruits, or masses Cardiac: Irregularly irregular rhythm with slow ventricular response.  no  rubs, or gallops,no edema.  Grade 2/6 systolic ejection murmur at the base.  No diastolic murmur.  Respiratory:  clear to auscultation bilaterally, normal work of breathing GI: soft, nontender, nondistended, + BS MS: no deformity or atrophy Skin: warm and dry, no rash Neuro:  Strength and sensation are intact Psych: euthymic mood, full affect   EKG:  EKG is ordered today. The ekg ordered today demonstrates atrial  fibrillation with slow ventricular response and heart rate of 44 bpm.  Nonspecific T-wave flattening.   Recent Labs: 03/26/2014: BUN 17; Creatinine 0.9; Hemoglobin 13.3; Platelets 103.0*; Potassium 3.5; Sodium 139    Lipid Panel No results found for: CHOL, TRIG, HDL, CHOLHDL, VLDL, LDLCALC, LDLDIRECT    Wt Readings from Last 3 Encounters:  08/23/14 202 lb (91.627 kg)  05/15/14 213 lb (96.616 kg)  05/11/14 211 lb (95.709 kg)         ASSESSMENT AND PLAN:  1. Atrial fibrillation, chronic 2. essential hypertension 3.  Osteoarthritis of both knees. 4.  Aortic valve disease with mild aortic insufficiency, asymptomatic.   Current medicines are reviewed at length with the patient today.  The patient does not have concerns regarding medicines.  The following changes have been made:  no change  Labs/ tests ordered today include: CBC, basal metabolic panel   Orders Placed This Encounter  Procedures  . CBC with Differential/Platelet  . Basic metabolic panel     Disposition:   FU with Dr. Mare Ferrari in 6 months for office visit and EKG   Signed, Darlin Coco, MD  08/23/2014 9:23 AM    [facility] [place], [place], [place]  [address] Phone: [phone]; Fax: [phone]

## 2014-08-23 NOTE — Patient Instructions (Signed)
Will obtain labs today and call you with the results (bmet/cbc)  Your physician recommends that you continue on your current medications as directed. Please refer to the Current Medication list given to you today.  Your physician wants you to follow-up in: 6 months with fasting labs (lp/bmet/hfp)  You will receive a reminder letter in the mail two months in advance. If you don't receive a letter, please call our office to schedule the follow-up appointment.

## 2014-08-23 NOTE — Addendum Note (Signed)
Addended by: Alvina Filbert B on: 08/23/2014 10:59 AM   Modules accepted: Orders

## 2014-08-24 ENCOUNTER — Other Ambulatory Visit: Payer: Medicare Other

## 2014-08-24 ENCOUNTER — Ambulatory Visit: Payer: Medicare Other | Admitting: Cardiology

## 2014-08-29 ENCOUNTER — Telehealth: Payer: Self-pay | Admitting: Cardiology

## 2014-08-29 NOTE — Telephone Encounter (Signed)
New Msg ° ° ° ° ° ° °Pt returning call. ° ° °Please call back. °

## 2014-08-29 NOTE — Telephone Encounter (Signed)
Advised patient of lab results  

## 2014-08-29 NOTE — Telephone Encounter (Signed)
-----   Message from Darlin Coco, MD sent at 08/24/2014  9:23 AM EST ----- Please report.  There is no anemia.  Hemoglobin is normal.  Platelet count is mildly low but improved from last time.  The blood sugar is mildly elevated.  Watch sweets.  Continue same medication.

## 2014-10-01 ENCOUNTER — Other Ambulatory Visit: Payer: Self-pay | Admitting: Cardiology

## 2014-10-02 DIAGNOSIS — H52203 Unspecified astigmatism, bilateral: Secondary | ICD-10-CM | POA: Diagnosis not present

## 2014-10-02 DIAGNOSIS — H524 Presbyopia: Secondary | ICD-10-CM | POA: Diagnosis not present

## 2014-10-31 ENCOUNTER — Other Ambulatory Visit: Payer: Self-pay

## 2014-10-31 MED ORDER — CITALOPRAM HYDROBROMIDE 20 MG PO TABS: 20.0000 mg | ORAL_TABLET | Freq: Every day | ORAL | Status: DC

## 2014-11-05 ENCOUNTER — Telehealth: Payer: Self-pay

## 2014-11-05 MED ORDER — ALPRAZOLAM 1 MG PO TABS: 1.0000 mg | ORAL_TABLET | Freq: Two times a day (BID) | ORAL | Status: DC | PRN

## 2014-11-05 NOTE — Telephone Encounter (Signed)
Alprazolam  Last visit 05/15/14 Last refill 05/15/14 #60 5 refill   [facility]

## 2014-11-05 NOTE — Telephone Encounter (Signed)
Faxed to pharmacy

## 2014-11-05 NOTE — Telephone Encounter (Signed)
Refill for 6 months. 

## 2014-11-05 NOTE — Addendum Note (Signed)
Addended by: Marcina Millard on: 11/05/2014 01:44 PM   Modules accepted: Orders

## 2014-11-13 ENCOUNTER — Ambulatory Visit (INDEPENDENT_AMBULATORY_CARE_PROVIDER_SITE_OTHER): Payer: Medicare Other | Admitting: Family Medicine

## 2014-11-13 ENCOUNTER — Encounter: Payer: Self-pay | Admitting: Family Medicine

## 2014-11-13 VITALS — BP 110/64 | HR 60 | Temp 98.4°F | Wt 203.0 lb

## 2014-11-13 DIAGNOSIS — I10 Essential (primary) hypertension: Secondary | ICD-10-CM | POA: Diagnosis not present

## 2014-11-13 DIAGNOSIS — Z8679 Personal history of other diseases of the circulatory system: Secondary | ICD-10-CM | POA: Diagnosis not present

## 2014-11-13 DIAGNOSIS — F411 Generalized anxiety disorder: Secondary | ICD-10-CM | POA: Diagnosis not present

## 2014-11-13 MED ORDER — BENAZEPRIL HCL 10 MG PO TABS: 10.0000 mg | ORAL_TABLET | Freq: Every day | ORAL | Status: DC

## 2014-11-13 NOTE — Progress Notes (Signed)
   Subjective:    Patient ID: Randall Lara, male    DOB: 11-Aug-1929, 81 y.o.   MRN: [id number]  HPI Patient here for medical follow-up. He has history of atrial fibrillation, high hypertension, prediabetes, GERD. He's lost some weight due to his efforts over the past several months. As he lost weight his reflux symptoms have improved. He's recently had some blood pressure readings around 95/50. He has not had any significant dizziness-other than rare mild light headedness.. Currently takes benazepril HCTZ and he scaled back to one half tablet daily and blood pressure has been very well controlled. No chest pains.  History of chronic anxiety. Is on alprazolam which he has been on for several years. He also takes low-dose citalopram and anxiety symptoms are fairly well controlled. He stays active with gardening. No recent falls  Past Medical History  Diagnosis Date  . Atrial fibrillation   . Joint pain   . Obesity   . Hypertension   . Anxiety   . Heart palpitations   . GERD (gastroesophageal reflux disease)    Past Surgical History  Procedure Laterality Date  . Esophagogastroduodenoscopy  03/29/2002    YYQ:MGNOIB esophagus, stomach, and duodenum through the second  portion.  Marland Kitchen Hernia repair      reports that he quit smoking about 39 years ago. His smoking use included Cigarettes. He has a 20 pack-year smoking history. He does not have any smokeless tobacco history on file. He reports that he drinks alcohol. He reports that he does not use illicit drugs. family history includes Stroke in his father, mother, and sister. There is no history of Colon cancer. Allergies  Allergen Reactions  . Ferrous Sulfate     REACTION: swelling, hives  . Sulfa Antibiotics     Reported per patient      Review of Systems  Constitutional: Positive for fatigue.  Eyes: Negative for visual disturbance.  Respiratory: Negative for cough, chest tightness and shortness of breath.   Cardiovascular: Negative  for chest pain, palpitations and leg swelling.  Neurological: Negative for dizziness, syncope, weakness, light-headedness and headaches.       Objective:   Physical Exam  Constitutional: He appears well-developed and well-nourished.  Neck: Neck supple. No thyromegaly present.  Cardiovascular:  Bradycardic with rate around 50 which he states is his baseline  Pulmonary/Chest: Effort normal and breath sounds normal. No respiratory distress. He has no wheezes. He has no rales.  Musculoskeletal: He exhibits no edema.          Assessment & Plan:  #1 hypertension. Standing blood pressure today by me 98/50. Discontinue benazepril-HCTZ. Start benazepril 10 mg once daily. If he develops any orthostatic symptoms we'll have him hold benazepril altogether #2 anxiety. He has long-standing history of chronic anxiety. Continue citalopram. Recent refill Xanax-he has been on thi for years.  We have been unable to get him tapered off.. Caution about risk for falls.

## 2014-11-13 NOTE — Progress Notes (Signed)
Pre visit review using our clinic review tool, if applicable. No additional management support is needed unless otherwise documented below in the visit note. 

## 2014-11-13 NOTE — Patient Instructions (Signed)
Stop the benazepril hctz and switch to plain benazepril 10 mg once daily. Stay well hydrated.

## 2014-12-11 ENCOUNTER — Telehealth: Payer: Self-pay | Admitting: Cardiology

## 2014-12-11 NOTE — Telephone Encounter (Signed)
New Message  STAT tooth extraction  Request for surgical clearance:  1. What type of surgery is being performed? STAT tooth extraction   2. When is this surgery scheduled? today   3. Are there any medications that need to be held prior to surgery and how long?eliquis  4. Name of physician performing surgery? [facility]   5. What is your office phone and fax number?   6.

## 2014-12-11 NOTE — Telephone Encounter (Signed)
Discussed with  Dr. Mare Ferrari and ok to hold Eliquis for 1 day or extract, whatever surgeon comfortable with Spoke with dental office when they called

## 2014-12-23 DIAGNOSIS — S8001XA Contusion of right knee, initial encounter: Secondary | ICD-10-CM | POA: Diagnosis not present

## 2014-12-23 DIAGNOSIS — M25461 Effusion, right knee: Secondary | ICD-10-CM | POA: Diagnosis not present

## 2014-12-23 DIAGNOSIS — Z7901 Long term (current) use of anticoagulants: Secondary | ICD-10-CM | POA: Diagnosis not present

## 2014-12-23 DIAGNOSIS — W19XXXA Unspecified fall, initial encounter: Secondary | ICD-10-CM | POA: Diagnosis not present

## 2014-12-23 DIAGNOSIS — Z8249 Family history of ischemic heart disease and other diseases of the circulatory system: Secondary | ICD-10-CM | POA: Diagnosis not present

## 2014-12-23 DIAGNOSIS — S8991XA Unspecified injury of right lower leg, initial encounter: Secondary | ICD-10-CM | POA: Diagnosis not present

## 2014-12-23 DIAGNOSIS — Z87891 Personal history of nicotine dependence: Secondary | ICD-10-CM | POA: Diagnosis not present

## 2014-12-23 DIAGNOSIS — M25561 Pain in right knee: Secondary | ICD-10-CM | POA: Diagnosis not present

## 2014-12-23 DIAGNOSIS — I4891 Unspecified atrial fibrillation: Secondary | ICD-10-CM | POA: Diagnosis not present

## 2014-12-23 DIAGNOSIS — I10 Essential (primary) hypertension: Secondary | ICD-10-CM | POA: Diagnosis not present

## 2014-12-28 ENCOUNTER — Encounter: Payer: Self-pay | Admitting: Family Medicine

## 2014-12-28 ENCOUNTER — Ambulatory Visit (INDEPENDENT_AMBULATORY_CARE_PROVIDER_SITE_OTHER): Payer: Medicare Other | Admitting: Family Medicine

## 2014-12-28 VITALS — BP 134/70 | HR 60 | Temp 98.1°F | Wt 202.0 lb

## 2014-12-28 DIAGNOSIS — M25461 Effusion, right knee: Secondary | ICD-10-CM | POA: Diagnosis not present

## 2014-12-28 MED ORDER — HYDROCODONE-ACETAMINOPHEN 5-325 MG PO TABS: 1.0000 | ORAL_TABLET | Freq: Four times a day (QID) | ORAL | Status: DC | PRN

## 2014-12-28 NOTE — Patient Instructions (Signed)

## 2014-12-28 NOTE — Progress Notes (Signed)
Pre visit review using our clinic review tool, if applicable. No additional management support is needed unless otherwise documented below in the visit note. 

## 2014-12-28 NOTE — Progress Notes (Signed)
   Subjective:    Patient ID: Randall Lara, male    DOB: 1930/06/19, 81 y.o.   MRN: [id number]  HPI Patient seen with right knee pain and effusion. 6 days ago on Saturday he was out working and apparently was stooped over and lost balance and fell and landed on some gravel with his right knee directly to the ground. No syncope. Went to [facility] reportedly negative. He takes Eliquis for atrial fibrillation.  He had tremendous effusion. He's been applying ice and elevating and he states that this has gone down significantly over the past few days. He is still having some pain with ambulation and occasionally at rest. He was prescribed oxycodone which is helped with the pain.  Past Medical History  Diagnosis Date  . Atrial fibrillation   . Joint pain   . Obesity   . Hypertension   . Anxiety   . Heart palpitations   . GERD (gastroesophageal reflux disease)    Past Surgical History  Procedure Laterality Date  . Esophagogastroduodenoscopy  03/29/2002    TLX:BWIOMB esophagus, stomach, and duodenum through the second  portion.  Marland Kitchen Hernia repair      reports that he quit smoking about 39 years ago. His smoking use included Cigarettes. He has a 20 pack-year smoking history. He does not have any smokeless tobacco history on file. He reports that he drinks alcohol. He reports that he does not use illicit drugs. family history includes Stroke in his father, mother, and sister. There is no history of Colon cancer. Allergies  Allergen Reactions  . Ferrous Sulfate     REACTION: swelling, hives  . Sulfa Antibiotics     Reported per patient      Review of Systems  Constitutional: Negative for fever and chills.       Objective:   Physical Exam  Constitutional: He appears well-developed and well-nourished. No distress.  Cardiovascular: Normal rate.   Pulmonary/Chest: Effort normal and breath sounds normal. No respiratory distress. He has no wheezes. He has no rales.    Musculoskeletal:  Right knee reveals fairly large effusion. No erythema. Slightly restricted range of motion with flexion secondary to swelling. He has some mild medial joint line tenderness. Ligament stability is difficult to assess secondary to fusion. He has some mild diffuse ecchymosis especially along the medial aspect          Assessment & Plan:  Large right knee effusion. Recent x-rays reportedly negative. We've recommended continued elevation, icing. Avoid non-steroidal's with chronic anticoagulation use. Reassess one week. Consider orthopedic referral at that time if not improving

## 2015-01-03 ENCOUNTER — Ambulatory Visit (INDEPENDENT_AMBULATORY_CARE_PROVIDER_SITE_OTHER): Payer: Medicare Other | Admitting: Family Medicine

## 2015-01-03 ENCOUNTER — Encounter: Payer: Self-pay | Admitting: Family Medicine

## 2015-01-03 VITALS — BP 110/62 | HR 58 | Temp 97.4°F | Wt 196.0 lb

## 2015-01-03 DIAGNOSIS — M25461 Effusion, right knee: Secondary | ICD-10-CM | POA: Diagnosis not present

## 2015-01-03 NOTE — Progress Notes (Signed)
   Subjective:    Patient ID: Randall Lara, male    DOB: December 19, 1929, 81 y.o.   MRN: [id number]  HPI Patient for follow-up right knee pain and effusion. Refer to prior note. He had fallen initially went to ER had x-rays that were negative. He had moderate effusion last week been icing and elevating. Overall improved. Less swelling. Still sore but deathly improved. He has noticed some bruising of his distal thigh this week. He is ambulating more. He is on chronic blood thinner. No locking or giving way  Past Medical History  Diagnosis Date  . Atrial fibrillation   . Joint pain   . Obesity   . Hypertension   . Anxiety   . Heart palpitations   . GERD (gastroesophageal reflux disease)    Past Surgical History  Procedure Laterality Date  . Esophagogastroduodenoscopy  03/29/2002    QDI:YMEBRA esophagus, stomach, and duodenum through the second  portion.  Marland Kitchen Hernia repair      reports that he quit smoking about 39 years ago. His smoking use included Cigarettes. He has a 20 pack-year smoking history. He does not have any smokeless tobacco history on file. He reports that he drinks alcohol. He reports that he does not use illicit drugs. family history includes Stroke in his father, mother, and sister. There is no history of Colon cancer. Allergies  Allergen Reactions  . Ferrous Sulfate     REACTION: swelling, hives  . Sulfa Antibiotics     Reported per patient      Review of Systems  Neurological: Negative for weakness and numbness.       Objective:   Physical Exam  Constitutional: He appears well-developed and well-nourished. No distress.  Cardiovascular: Normal rate.   Pulmonary/Chest: Effort normal and breath sounds normal. No respiratory distress. He has no wheezes. He has no rales.  Musculoskeletal:  Right knee very small effusion - much improved compared with last week. No warmth. No erythema. Full range of motion. Still has some medial joint line tenderness. He has some  diffuse ecchymosis of the distal inner right thigh. No palpated hematoma.          Assessment & Plan:  Right knee effusion. Improving. He will continue with ice, elevation, elastic knee sleeve for additional support. Reassess 2 weeks

## 2015-01-03 NOTE — Patient Instructions (Signed)
Consider elastic knee support for right knee    Continue with elevation and icing.

## 2015-01-03 NOTE — Progress Notes (Signed)
Pre visit review using our clinic review tool, if applicable. No additional management support is needed unless otherwise documented below in the visit note. 

## 2015-01-17 ENCOUNTER — Encounter: Payer: Self-pay | Admitting: Family Medicine

## 2015-01-17 ENCOUNTER — Ambulatory Visit (INDEPENDENT_AMBULATORY_CARE_PROVIDER_SITE_OTHER): Payer: Medicare Other | Admitting: Family Medicine

## 2015-01-17 VITALS — BP 124/68 | HR 58 | Temp 97.7°F | Wt 199.0 lb

## 2015-01-17 DIAGNOSIS — M25561 Pain in right knee: Secondary | ICD-10-CM | POA: Diagnosis not present

## 2015-01-17 NOTE — Patient Instructions (Signed)
Do exercises, as discussed, with knee extensions with weights or exercise bike to strengthen quadriceps muscle.   Wear elastic knee sleeve with activities.

## 2015-01-17 NOTE — Progress Notes (Signed)
   Subjective:    Patient ID: Randall Lara, male    DOB: 05/20/1930, 81 y.o.   MRN: [id number]  HPI Here for follow-up right knee pain. Refer to prior notes. He states this is about 60-70% improved. His bruising has resolved and effusion has fully cleared. He still has some pain with hills but is walking better on flat surfaces. He has not required any further use of hydrocodone. His remote history of arthroscopic surgery of the same knee. No locking or giving way.  Past Medical History  Diagnosis Date  . Atrial fibrillation   . Joint pain   . Obesity   . Hypertension   . Anxiety   . Heart palpitations   . GERD (gastroesophageal reflux disease)    Past Surgical History  Procedure Laterality Date  . Esophagogastroduodenoscopy  03/29/2002    IRC:VELFYB esophagus, stomach, and duodenum through the second  portion.  Marland Kitchen Hernia repair      reports that he quit smoking about 39 years ago. His smoking use included Cigarettes. He has a 20 pack-year smoking history. He does not have any smokeless tobacco history on file. He reports that he drinks alcohol. He reports that he does not use illicit drugs. family history includes Stroke in his father, mother, and sister. There is no history of Colon cancer. Allergies  Allergen Reactions  . Ferrous Sulfate     REACTION: swelling, hives  . Sulfa Antibiotics     Reported per patient      Review of Systems  Neurological: Negative for weakness.       Objective:   Physical Exam  Constitutional: He appears well-developed and well-nourished.  Cardiovascular: Normal rate.   Pulmonary/Chest: Effort normal and breath sounds normal. No respiratory distress. He has no wheezes. He has no rales.  Musculoskeletal:  Right knee-ecchymosis fully resolved. Diffusion fully resolved. Full range of motion. No localized tenderness. He does seem to have underdeveloped quadriceps muscles bilaterally          Assessment & Plan:  Right knee pain/effusion.  Improving. We have strongly advised that he do some quadriceps strengthening exercises with examples given. We have encouraged elastic knee sleeve support as he resumes his usual gardening activities.

## 2015-01-17 NOTE — Progress Notes (Signed)
Pre visit review using our clinic review tool, if applicable. No additional management support is needed unless otherwise documented below in the visit note. 

## 2015-03-12 ENCOUNTER — Ambulatory Visit (INDEPENDENT_AMBULATORY_CARE_PROVIDER_SITE_OTHER): Payer: Medicare Other | Admitting: Cardiology

## 2015-03-12 ENCOUNTER — Encounter: Payer: Self-pay | Admitting: Cardiology

## 2015-03-12 VITALS — BP 143/70 | HR 42 | Ht 72.0 in | Wt 204.0 lb

## 2015-03-12 DIAGNOSIS — I119 Hypertensive heart disease without heart failure: Secondary | ICD-10-CM

## 2015-03-12 DIAGNOSIS — I482 Chronic atrial fibrillation, unspecified: Secondary | ICD-10-CM

## 2015-03-12 DIAGNOSIS — I359 Nonrheumatic aortic valve disorder, unspecified: Secondary | ICD-10-CM

## 2015-03-12 DIAGNOSIS — M170 Bilateral primary osteoarthritis of knee: Secondary | ICD-10-CM | POA: Diagnosis not present

## 2015-03-12 NOTE — Patient Instructions (Signed)
Medication Instructions:  Your physician recommends that you continue on your current medications as directed. Please refer to the Current Medication list given to you today.  Labwork: NONE3  Testing/Procedures: NONE  Follow-Up: Your physician wants you to follow-up in: 6 MONTH OV/EKG  You will receive a reminder letter in the mail two months in advance. If you don't receive a letter, please call our office to schedule the follow-up appointment.    

## 2015-03-12 NOTE — Progress Notes (Signed)
Cardiology Office Note   Date:  03/12/2015   ID:  Randall Lara, DOB [birth year], MRN [id number]  PCP:  [name], MD  Cardiologist: Darlin Coco MD  No chief complaint on file.     History of Present Illness: Randall Lara is a 81 y.o. male who presents for a six-month follow-up office visit  . He has a history of established chronic asymptomatic atrial fibrillation. He previously was on Coumadin He has had no thromboembolic events. He does not have any history of known ischemic heart disease. In Dec 2014 he was seen as a work in because of some exertional chest tightness. He had a nuclear stress test on 07/07/13 which was low risk .Last year he was switched from warfarin to Apixaban. Since last visit he has continued to feel well. He is active working around his house and garden. he has not been having any chest pain. He has not had to take any nitroglycerin. He is not having any dizziness or syncope. He has a chronically slow response to his atrial fibrillation. He has a known heart murmur. Echocardiogram 07/01/10 showed LVH with ejection fraction 65-70% and mild aortic insufficiency. There was mild thickening of the aortic valve. Not having any symptoms from his aortic valve.  Past Medical History  Diagnosis Date  . Atrial fibrillation   . Joint pain   . Obesity   . Hypertension   . Anxiety   . Heart palpitations   . GERD (gastroesophageal reflux disease)     Past Surgical History  Procedure Laterality Date  . Esophagogastroduodenoscopy  03/29/2002    EXN:TZGYFV esophagus, stomach, and duodenum through the second  portion.  Marland Kitchen Hernia repair       Current Outpatient Prescriptions  Medication Sig Dispense Refill  . ALPRAZolam (XANAX) 1 MG tablet Take 1 tablet (1 mg total) by mouth 2 (two) times daily as needed. 60 tablet 5  . benazepril (LOTENSIN) 10 MG tablet Take 1 tablet (10 mg total) by mouth daily. 30 tablet 11  . citalopram (CELEXA) 20 MG  tablet Take 1 tablet (20 mg total) by mouth daily. 90 tablet 1  . ELIQUIS 5 MG TABS tablet TAKE ONE TABLET BY MOUTH TWICE DAILY 60 tablet 5  . nitroGLYCERIN (NITROSTAT) 0.4 MG SL tablet Place 1 tablet (0.4 mg total) under the tongue every 5 (five) minutes as needed for chest pain. 25 tablet 11   No current facility-administered medications for this visit.    Allergies:   Ferrous sulfate and Sulfa antibiotics    Social History:  The patient  reports that he quit smoking about 39 years ago. His smoking use included Cigarettes. He has a 20 pack-year smoking history. He does not have any smokeless tobacco history on file. He reports that he drinks alcohol. He reports that he does not use illicit drugs.   Family History:  The patient's family history includes Heart attack in his brother; Hypertension in his mother and sister; Stroke in his father, mother, and sister. There is no history of Colon cancer.    ROS:  Please see the history of present illness.   Otherwise, review of systems are positive for none.   All other systems are reviewed and negative.    PHYSICAL EXAM: VS:  BP 143/70 mmHg  Pulse 42  Ht 6' (1.829 m)  Wt 204 lb (92.534 kg)  BMI 27.66 kg/m2 , BMI Body mass index is 27.66 kg/(m^2). GEN: Well nourished, well developed, in no acute  distress HEENT: normal Neck: no JVD, carotid bruits, or masses Cardiac: Irregularly irregular rhythm.  Grade 2/6 systolic ejection murmur at the base. Respiratory:  clear to auscultation bilaterally, normal work of breathing GI: soft, nontender, nondistended, + BS MS: no deformity or atrophy Skin: warm and dry, no rash Neuro:  Strength and sensation are intact Psych: euthymic mood, full affect   EKG:  EKG is ordered today. The ekg ordered today demonstrates atrial fibrillation with marked bradycardia.  Heart rate 42 bpm.   Recent Labs: 08/23/2014: BUN 15; Creatinine, Ser 1.01; Hemoglobin 14.2; Platelets 120.0*; Potassium 3.9; Sodium 139     Lipid Panel No results found for: CHOL, TRIG, HDL, CHOLHDL, VLDL, LDLCALC, LDLDIRECT    Wt Readings from Last 3 Encounters:  03/12/15 204 lb (92.534 kg)  01/17/15 199 lb (90.266 kg)  01/03/15 196 lb (88.905 kg)        ASSESSMENT AND PLAN:  1. Atrial fibrillation, chronic 2. essential hypertension 3. Osteoarthritis of both knees. 4. Aortic valve disease with mild aortic insufficiency, asymptomatic.   Current medicines are reviewed at length with the patient today.  The patient does not have concerns regarding medicines.  The following changes have been made:  no change  Labs/ tests ordered today include:   Orders Placed This Encounter  Procedures  . EKG 12-Lead     Disposition: Continue current medication.  Recheck in 6 months for office visit and EKG. he will let us know if he starts to have any symptoms from his bradycardia, such as dizziness or syncope or weakness.  Berna Spare MD 03/12/2015 5:31 PM    [facility] [address], [place], [place]  [address] Phone: [phone]; Fax: [phone]

## 2015-04-01 ENCOUNTER — Other Ambulatory Visit: Payer: Self-pay | Admitting: Cardiology

## 2015-04-01 ENCOUNTER — Encounter: Payer: Self-pay | Admitting: Internal Medicine

## 2015-04-04 ENCOUNTER — Telehealth: Payer: Self-pay | Admitting: Internal Medicine

## 2015-04-04 NOTE — Telephone Encounter (Signed)
PATIENT RECEIVED LETTER TO SCHEDULE AND STATED HE WAS NOT HAVING ANY PROBLEMS AND WOULD CALL IF HE HAD ANY ISSUES

## 2015-04-30 ENCOUNTER — Other Ambulatory Visit: Payer: Self-pay | Admitting: *Deleted

## 2015-04-30 NOTE — Telephone Encounter (Signed)
Pt last visit 01/17/15 Last refill 11/05/14 #60 with 5 refills

## 2015-05-01 MED ORDER — ALPRAZOLAM 1 MG PO TABS: 1.0000 mg | ORAL_TABLET | Freq: Two times a day (BID) | ORAL | Status: DC | PRN

## 2015-05-01 NOTE — Telephone Encounter (Signed)
Refill for 6 months. 

## 2015-05-03 ENCOUNTER — Other Ambulatory Visit: Payer: Self-pay | Admitting: Family Medicine

## 2015-05-16 ENCOUNTER — Ambulatory Visit: Payer: Medicare Other | Admitting: Family Medicine

## 2015-05-20 ENCOUNTER — Ambulatory Visit (INDEPENDENT_AMBULATORY_CARE_PROVIDER_SITE_OTHER): Payer: Medicare Other | Admitting: Family Medicine

## 2015-05-20 ENCOUNTER — Encounter: Payer: Self-pay | Admitting: Family Medicine

## 2015-05-20 VITALS — BP 110/80 | HR 47 | Temp 98.1°F | Resp 16 | Ht 72.0 in | Wt 204.8 lb

## 2015-05-20 DIAGNOSIS — F411 Generalized anxiety disorder: Secondary | ICD-10-CM | POA: Diagnosis not present

## 2015-05-20 DIAGNOSIS — Z8679 Personal history of other diseases of the circulatory system: Secondary | ICD-10-CM | POA: Diagnosis not present

## 2015-05-20 DIAGNOSIS — I10 Essential (primary) hypertension: Secondary | ICD-10-CM

## 2015-05-20 DIAGNOSIS — R7303 Prediabetes: Secondary | ICD-10-CM

## 2015-05-20 LAB — BASIC METABOLIC PANEL
BUN: 15 mg/dL (ref 6–23)
CHLORIDE: 102 meq/L (ref 96–112)
CO2: 30 meq/L (ref 19–32)
Calcium: 9.9 mg/dL (ref 8.4–10.5)
Creatinine, Ser: 0.92 mg/dL (ref 0.40–1.50)
GFR: 83.36 mL/min (ref >60.00)
GLUCOSE: 98 mg/dL (ref 70–99)
Potassium: 4.5 mEq/L (ref 3.5–5.1)
Sodium: 142 mEq/L (ref 135–145)

## 2015-05-20 MED ORDER — BENAZEPRIL-HYDROCHLOROTHIAZIDE 20-12.5 MG PO TABS
ORAL_TABLET | ORAL | Status: DC
Start: 2015-05-20 — End: 2015-09-09

## 2015-05-20 NOTE — Progress Notes (Signed)
Pre visit review using our clinic review tool, if applicable. No additional management support is needed unless otherwise documented below in the visit note. 

## 2015-05-20 NOTE — Progress Notes (Signed)
Subjective:    Patient ID: Randall Lara, male    DOB: [birth year], 81 y.o.   MRN: [id number]  HPI Medical follow-up  Hypertension treated with benazepril. He had previously been on benazepril HCTZ but had low blood pressure after losing some weight. We switched over to plain benazepril but his weight started going back up. He and his wife started one half tablet of benazepril 20/12.5 mg daily couple weeks ago and blood pressures have been improved. He would like to go back to old dosage.  History of atrial fibrillation. He remains on anticoagulant with  Eliquis.  Frequently has pulse down in the 40s but does not have any associated dizziness. No recent syncope.  Long-standing history of anxiety. He has been on Xanax for many years. We've tried tapering down. We also tried Celexa which he takes 20 mg once daily. Initially saw some improvement. He is requesting to go backup on alprazolam at this time. We've explained our reluctance because of risk of falls especially in the elderly  Past Medical History  Diagnosis Date  . Atrial fibrillation ([place])   . Joint pain   . Obesity   . Hypertension   . Anxiety   . Heart palpitations   . GERD (gastroesophageal reflux disease)    Past Surgical History  Procedure Laterality Date  . Esophagogastroduodenoscopy  03/29/2002    [id number] esophagus, stomach, and duodenum through the second  portion.  Marland Kitchen Hernia repair      reports that he quit smoking about 39 years ago. His smoking use included Cigarettes. He has a 20 pack-year smoking history. He does not have any smokeless tobacco history on file. He reports that he drinks alcohol. He reports that he does not use illicit drugs. family history includes Heart attack in his brother; Hypertension in his mother and sister; Stroke in his father, mother, and sister. There is no history of Colon cancer. Allergies  Allergen Reactions  . Ferrous Sulfate     REACTION: swelling, hives  . Sulfa Antibiotics       Reported per patient      Review of Systems  Constitutional: Negative for fatigue.  Eyes: Negative for visual disturbance.  Respiratory: Negative for cough, chest tightness and shortness of breath.   Cardiovascular: Negative for chest pain, palpitations and leg swelling.  Neurological: Negative for dizziness, syncope, weakness, light-headedness and headaches.       Objective:   Physical Exam  Constitutional: He is oriented to person, place, and time. He appears well-developed and well-nourished. No distress.  HENT:  Head: Normocephalic and atraumatic.  Right Ear: External ear normal.  Left Ear: External ear normal.  Mouth/Throat: Oropharynx is clear and moist.  Eyes: Conjunctivae and EOM are normal. Pupils are equal, round, and reactive to light.  Neck: Normal range of motion. Neck supple. No thyromegaly present.  Cardiovascular: Normal rate, regular rhythm and normal heart sounds.   No murmur heard. Pulmonary/Chest: No respiratory distress. He has no wheezes. He has no rales.  Abdominal: Soft. Bowel sounds are normal. He exhibits no distension and no mass. There is no tenderness. There is no rebound and no guarding.  Musculoskeletal: He exhibits no edema.  Lymphadenopathy:    He has no cervical adenopathy.  Neurological: He is alert and oriented to person, place, and time. He displays normal reflexes. No cranial nerve deficit.  Skin: No rash noted.  Psychiatric:  Anxious appearing which is his baseline          Assessment &  Plan:  #1 hypertension stable and at goal. Refill benazepril HCTZ 20/12.5 one half tablet daily. #2 long-standing history of anxiety.  try increasing his citalopram to 1-1/2 tablets daily. We explained the rationale for not going back up on alprazolam dose (ie increased risk of falls in elderly). #3 atrial fibrillation. Rate controlled. Continue oral anticoagulant. No recent bleeding issues.

## 2015-07-09 ENCOUNTER — Ambulatory Visit (INDEPENDENT_AMBULATORY_CARE_PROVIDER_SITE_OTHER): Payer: Medicare Other | Admitting: Adult Health

## 2015-07-09 ENCOUNTER — Encounter: Payer: Self-pay | Admitting: Adult Health

## 2015-07-09 VITALS — BP 110/60 | HR 55 | Temp 97.8°F | Ht 72.0 in | Wt 208.8 lb

## 2015-07-09 DIAGNOSIS — F418 Other specified anxiety disorders: Secondary | ICD-10-CM

## 2015-07-09 DIAGNOSIS — F419 Anxiety disorder, unspecified: Principal | ICD-10-CM

## 2015-07-09 DIAGNOSIS — F329 Major depressive disorder, single episode, unspecified: Secondary | ICD-10-CM

## 2015-07-09 NOTE — Progress Notes (Signed)
   Subjective:    Patient ID: [name], male    DOB: 1929/08/02, 81 y.o.   MRN: [id number]  HPI  81 year old male, patient of Dr. Elease Hashimoto,  who presents to the office with multiple complaints that all stem from the recent reduction in the amount of xanax he was prescribed. He last saw Dr. Elease Hashimoto on 05/20/2015 at which time his xanax was decreased from TID to BID. He feels as though his anxiety is worse, he is not sleeping, his blood pressure is high ( today had a reading of [id number] systolic at home), his heart rate is low and he can't calm his mind.  He continues to take Celexa 30 mg but does not feel like it is making a difference.   Currently he takes 1 mg Xanax in the morning and 1 mg Xanax in the afternoon. He endorses he can't take one in the morning and one at night because " my anxiety is so bad in the afternoon, I have to take one."  It is noted that his pulse was low during last visit. He denies any lightheadedness or dizziness.    Review of Systems  Constitutional: Negative.   HENT: Negative.   Eyes: Negative.   Respiratory: Negative.   Cardiovascular: Negative.   Gastrointestinal: Negative.   Endocrine: Negative.   Musculoskeletal: Negative.   Skin: Negative.   Allergic/Immunologic: Negative.   Neurological: Negative.   Hematological: Negative.   Psychiatric/Behavioral: Positive for sleep disturbance, decreased concentration and agitation. The patient is nervous/anxious.   All other systems reviewed and are negative.      Objective:   Physical Exam  Constitutional: He is oriented to person, place, and time. He appears well-developed and well-nourished. No distress.  HENT:  Head: Normocephalic and atraumatic.  Right Ear: External ear normal.  Left Ear: External ear normal.  Nose: Nose normal.  Mouth/Throat: Oropharynx is clear and moist. No oropharyngeal exudate.  Neck: Normal range of motion. Neck supple.  Cardiovascular: Normal rate, regular rhythm, normal  heart sounds and intact distal pulses.  Exam reveals no gallop and no friction rub.   No murmur heard. Pulmonary/Chest: Effort normal and breath sounds normal. No respiratory distress. He has no wheezes. He has no rales. He exhibits no tenderness.  Abdominal: There is no guarding.  Neurological: He is alert and oriented to person, place, and time.  Skin: Skin is warm and dry. He is not diaphoretic. No pallor.  Psychiatric: He has a normal mood and affect. His behavior is normal. Judgment and thought content normal.  anxious  Nursing note and vitals reviewed.     Assessment & Plan:  1. Anxiety and depression - Spoke to patients PCP, Dr. Elease Hashimoto. MD is concerned for falls and would like patient to stay on current dose.  - Continue Xanax BID - Follow up as needed

## 2015-07-19 ENCOUNTER — Ambulatory Visit: Payer: Medicare Other | Admitting: Family Medicine

## 2015-09-09 ENCOUNTER — Ambulatory Visit (INDEPENDENT_AMBULATORY_CARE_PROVIDER_SITE_OTHER): Payer: Medicare Other | Admitting: Cardiology

## 2015-09-09 ENCOUNTER — Encounter: Payer: Self-pay | Admitting: Cardiology

## 2015-09-09 VITALS — BP 112/54 | HR 48 | Ht 72.0 in | Wt 205.8 lb

## 2015-09-09 DIAGNOSIS — I359 Nonrheumatic aortic valve disorder, unspecified: Secondary | ICD-10-CM

## 2015-09-09 DIAGNOSIS — I482 Chronic atrial fibrillation, unspecified: Secondary | ICD-10-CM

## 2015-09-09 DIAGNOSIS — I119 Hypertensive heart disease without heart failure: Secondary | ICD-10-CM | POA: Diagnosis not present

## 2015-09-09 LAB — BASIC METABOLIC PANEL
BUN: 13 mg/dL (ref 7–25)
CHLORIDE: 103 mmol/L (ref 98–110)
CO2: 30 mmol/L (ref 20–31)
Calcium: 9.3 mg/dL (ref 8.6–10.3)
Creat: 0.98 mg/dL (ref 0.70–1.11)
Glucose, Bld: 92 mg/dL (ref 65–99)
POTASSIUM: 3.7 mmol/L (ref 3.5–5.3)
Sodium: 139 mmol/L (ref 135–146)

## 2015-09-09 LAB — CBC WITH DIFFERENTIAL/PLATELET
BASOS ABS: 0 10*3/uL (ref 0.0–0.1)
Basophils Relative: 0 % (ref 0–1)
Eosinophils Absolute: 0.1 10*3/uL (ref 0.0–0.7)
Eosinophils Relative: 2 % (ref 0–5)
HEMATOCRIT: 39.8 % (ref 39.0–52.0)
Hemoglobin: 13.1 g/dL (ref 13.0–17.0)
LYMPHS ABS: 1.7 10*3/uL (ref 0.7–4.0)
LYMPHS PCT: 39 % (ref 12–46)
MCH: 31.5 pg (ref 26.0–34.0)
MCHC: 32.9 g/dL (ref 30.0–36.0)
MCV: 95.7 fL (ref 78.0–100.0)
MONOS PCT: 10 % (ref 3–12)
MPV: 10.1 fL (ref 8.6–12.4)
Monocytes Absolute: 0.4 10*3/uL (ref 0.1–1.0)
NEUTROS PCT: 49 % (ref 43–77)
Neutro Abs: 2.2 10*3/uL (ref 1.7–7.7)
Platelets: 112 10*3/uL — ABNORMAL LOW (ref 150–400)
RBC: 4.16 MIL/uL — ABNORMAL LOW (ref 4.22–5.81)
RDW: 13.6 % (ref 11.5–15.5)
WBC: 4.4 10*3/uL (ref 4.0–10.5)

## 2015-09-09 NOTE — Patient Instructions (Signed)
Medication Instructions:  Your physician recommends that you continue on your current medications as directed. Please refer to the Current Medication list given to you today.  Labwork: bmet/cbc  Testing/Procedures: none  Follow-Up: Your physician wants you to follow-up in: 6 month ov with Dr Domenic Polite at the [facility] will receive a reminder letter in the mail two months in advance. If you don't receive a letter, please call our office to schedule the follow-up appointment.  If you need a refill on your cardiac medications before your next appointment, please call your pharmacy.

## 2015-09-09 NOTE — Progress Notes (Signed)
Cardiology Office Note   Date:  09/09/2015   ID:  Randall Lara, DOB Feb 03, 1930, MRN [id number]  PCP:  Randall Post, Lara  Cardiologist: Randall Lara  Chief Complaint  Patient presents with  . scheduled follow up    essential hypertension      History of Present Illness: Randall Lara is a 82 y.o. male who presents for a six-month follow-up visit. He has a history of established chronic asymptomatic atrial fibrillation. He previously was on Coumadin.  He is now on Apixaban and is tolerating it well. He has had no thromboembolic events. He does not have any history of known ischemic heart disease. In Dec 2014 he was seen as a work in because of some exertional chest tightness. He had a nuclear stress test on 07/07/13 which was low risk .Last year he was switched from warfarin to Apixaban. Since last visit he has continued to feel well. He is active working around his house and garden. he has not been having any chest pain. He has not had to take any nitroglycerin. He is not having any dizziness or syncope. He has a chronically slow response to his atrial fibrillation. He has a known heart murmur. Echocardiogram 07/01/10 showed LVH with ejection fraction 65-70% and mild aortic insufficiency. There was mild thickening of the aortic valve. Not having any symptoms from his aortic valve. His ambulation is limited by painful arthritis of both knees, particularly the right.  His orthopedist is Randall Lara.  He was concerned about being on blood thinners if he needed surgery.  I explained that we would hold his Apixaban for 48 hours prior to any elective surgery.   Past Medical History  Diagnosis Date  . Atrial fibrillation ([place])   . Joint pain   . Obesity   . Hypertension   . Anxiety   . Heart palpitations   . GERD (gastroesophageal reflux disease)     Past Surgical History  Procedure Laterality Date  . Esophagogastroduodenoscopy  03/29/2002    [id number]  esophagus, stomach, and duodenum through the second  portion.  Marland Kitchen Hernia repair       Current Outpatient Prescriptions  Medication Sig Dispense Refill  . ALPRAZolam (XANAX) 1 MG tablet Take 1 mg by mouth 2 (two) times daily as needed for anxiety.    . benazepril-hydrochlorthiazide (LOTENSIN HCT) 20-12.5 MG tablet Take 0.5 tablets by mouth daily.    . citalopram (CELEXA) 20 MG tablet TAKE 1 TABLET BY MOUTH DAILY. 90 tablet 1  . ELIQUIS 5 MG TABS tablet TAKE 1 TABLET BY MOUTH TWICE DAILY 60 tablet 9  . nitroGLYCERIN (NITROSTAT) 0.4 MG SL tablet Place 1 tablet (0.4 mg total) under the tongue every 5 (five) minutes as needed for chest pain. 25 tablet 11   No current facility-administered medications for this visit.    Allergies:   Ferrous sulfate and Sulfa antibiotics    Social History:  The patient  reports that he quit smoking about 40 years ago. His smoking use included Cigarettes. He has a 20 pack-year smoking history. He does not have any smokeless tobacco history on file. He reports that he drinks alcohol. He reports that he does not use illicit drugs.   Family History:  The patient's family history includes Heart attack in his brother; Hypertension in his mother and sister; Stroke in his father, mother, and sister. There is no history of Colon cancer.    ROS:  Please see the history of present illness.  Otherwise, review of systems are positive for none.   All other systems are reviewed and negative.    PHYSICAL EXAM: VS:  BP 112/54 mmHg  Pulse 48  Ht 6' (1.829 m)  Wt 205 lb 12.8 oz (93.35 kg)  BMI 27.91 kg/m2 , BMI Body mass index is 27.91 kg/(m^2). GEN: Well nourished, well developed, in no acute distress HEENT: normal Neck: no JVD, carotid bruits, or masses Cardiac: RRR; no murmurs, rubs, or gallops,no edema  Respiratory:  clear to auscultation bilaterally, normal work of breathing GI: soft, nontender, nondistended, + BS MS: no deformity or atrophy Skin: warm and dry, no  rash Neuro:  Strength and sensation are intact Psych: euthymic mood, full affect   EKG:  EKG is ordered today. The ekg ordered today demonstrates atrial fibrillation with slow ventricular response of 41 bpm.  Since previous tracing of 03/12/15, no significant change   Recent Labs: 05/20/2015: BUN 15; Creatinine, Ser 0.92; Potassium 4.5; Sodium 142    Lipid Panel No results found for: CHOL, TRIG, HDL, CHOLHDL, VLDL, LDLCALC, LDLDIRECT    Wt Readings from Last 3 Encounters:  09/09/15 205 lb 12.8 oz (93.35 kg)  07/09/15 208 lb 12.8 oz (94.711 kg)  05/20/15 204 lb 12.8 oz (92.897 kg)         ASSESSMENT AND PLAN:  1. Atrial fibrillation, chronic 2. essential hypertension 3. Osteoarthritis of both knees. 4. Aortic valve disease with mild aortic insufficiency, asymptomatic.   Current medicines are reviewed at length with the patient today.  The patient does not have concerns regarding medicines.  The following changes have been made:  no change  Labs/ tests ordered today include:   Orders Placed This Encounter  Procedures  . Basic metabolic panel  . CBC with Differential/Platelet  . EKG 12-Lead     Disposition:  Continue on current medication.  We are checking a CBC and a BMET today.  He will follow-up in [place] with Dr. Domenic Polite in 6 months  Signed, Randall Lara 09/09/2015 1:31 PM    Randall Lara, [place], [place]  [address] Phone: [phone]; Fax: [phone]

## 2015-09-10 ENCOUNTER — Telehealth: Payer: Self-pay | Admitting: Cardiology

## 2015-09-10 NOTE — Telephone Encounter (Signed)
F/u   Pt retrurning RN phone call- can leave detailed message on VM. Please call back and discuss.

## 2015-09-10 NOTE — Progress Notes (Signed)
Quick Note:  Please report to patient. The recent labs are stable. Continue same medication and careful diet. ______ 

## 2015-10-11 ENCOUNTER — Other Ambulatory Visit: Payer: Self-pay | Admitting: Cardiology

## 2015-10-14 NOTE — Telephone Encounter (Signed)
Left message to call back  Patient needs to get Xanax from PCP

## 2015-10-15 ENCOUNTER — Telehealth: Payer: Self-pay | Admitting: Cardiology

## 2015-10-15 NOTE — Telephone Encounter (Signed)
Follow Up ° °Pt returned call//  °

## 2015-10-15 NOTE — Telephone Encounter (Signed)
Line busy when dialed. 

## 2015-10-15 NOTE — Telephone Encounter (Signed)
New message   Pt is returning call to [place] office

## 2015-10-15 NOTE — Telephone Encounter (Signed)
I called patient, phone rings several times w/ no answer or VM pickup.

## 2015-10-15 NOTE — Telephone Encounter (Signed)
Line busy

## 2015-10-16 ENCOUNTER — Ambulatory Visit (INDEPENDENT_AMBULATORY_CARE_PROVIDER_SITE_OTHER): Payer: Medicare Other | Admitting: Family Medicine

## 2015-10-16 ENCOUNTER — Encounter: Payer: Self-pay | Admitting: Family Medicine

## 2015-10-16 VITALS — BP 90/50 | HR 64 | Temp 97.8°F | Ht 72.0 in | Wt 205.0 lb

## 2015-10-16 DIAGNOSIS — F411 Generalized anxiety disorder: Secondary | ICD-10-CM

## 2015-10-16 DIAGNOSIS — I10 Essential (primary) hypertension: Secondary | ICD-10-CM | POA: Diagnosis not present

## 2015-10-16 DIAGNOSIS — M25561 Pain in right knee: Secondary | ICD-10-CM

## 2015-10-16 MED ORDER — ESCITALOPRAM OXALATE 10 MG PO TABS: 10.0000 mg | ORAL_TABLET | Freq: Every day | ORAL | Status: DC

## 2015-10-16 NOTE — Progress Notes (Signed)
   Subjective:    Patient ID: Randall Lara, male    DOB: 1930/03/15, 82 y.o.   MRN: [id number]  HPI  Persistent right knee pain.  Patient had fall last summer and went to [facility] and x-rays reportedly negative.  He is on blood thinner with Eliquis. He had some initial significant swelling but with elevation, compression, and ice this essentially resolved. We've not seen him for several months since August and comes back now complaining of persistent right knee pain and some intermittent swelling. He has scheduled appointment with orthopedist in one month. No locking or giving way. He has pain with ambulation. Most of his pain is medial.   History of hypertension. He takes Benazepril- HCTZ one half tablet daily. Denies any dizziness or syncope. Blood pressures have been somewhat on the low side recently. Generally stays well-hydrated.   Chronic anxiety. For several years has been on alprazolam 1 tablet twice daily. He currently takes Celexa in addition but does not feel this is helping much. Would like to consider change of medication. Denies any depression symptoms.  Past Medical History  Diagnosis Date  . Atrial fibrillation ([place])   . Joint pain   . Obesity   . Hypertension   . Anxiety   . Heart palpitations   . GERD (gastroesophageal reflux disease)    Past Surgical History  Procedure Laterality Date  . Esophagogastroduodenoscopy  03/29/2002    [id number] esophagus, stomach, and duodenum through the second  portion.  Marland Kitchen Hernia repair      reports that he quit smoking about 40 years ago. His smoking use included Cigarettes. He has a 20 pack-year smoking history. He does not have any smokeless tobacco history on file. He reports that he drinks alcohol. He reports that he does not use illicit drugs. family history includes Heart attack in his brother; Hypertension in his mother and sister; Stroke in his father, mother, and sister. There is no history of Colon cancer. Allergies    Allergen Reactions  . Ferrous Sulfate     REACTION: swelling, hives  . Sulfa Antibiotics     hives      Review of Systems  Constitutional: Negative for fatigue and unexpected weight change.  Eyes: Negative for visual disturbance.  Respiratory: Negative for cough, chest tightness and shortness of breath.   Cardiovascular: Negative for chest pain, palpitations and leg swelling.  Endocrine: Negative for polydipsia and polyuria.  Musculoskeletal: Positive for arthralgias.  Neurological: Negative for dizziness, syncope, weakness, light-headedness and headaches.       Objective:   Physical Exam  Constitutional: He appears well-developed and well-nourished.  Neck: Neck supple.  Cardiovascular: Normal rate and regular rhythm.   Pulmonary/Chest: Effort normal and breath sounds normal. No respiratory distress. He has no wheezes. He has no rales.  Musculoskeletal:  Right knee no ecchymosis. No warmth. No erythema. He has moderate medial joint line tenderness. Full range of motion. Minimal popliteal swelling.          Assessment & Plan:   #1 chronic knee pain following fall back in July 2016. Question meniscus tear (medial meniscus). Set up MRI to further assess. This is starting to really impair his ambulation significantly.   #2 chronic anxiety. Discontinue Celexa. Start Lexapro 10 mg once daily. Follow-up assessment in one month   #3 hypertension. Somewhat low blood pressure today. Hold benazepril HCTZ for systolic blood pressure less than 100. Stay well-hydrated.

## 2015-10-16 NOTE — Progress Notes (Signed)
Pre visit review using our clinic review tool, if applicable. No additional management support is needed unless otherwise documented below in the visit note. 

## 2015-10-16 NOTE — Telephone Encounter (Signed)
Patient stated that he received a voicemail from a lady requesting that he call Dr Bobbye Riggs office. He has no idea what the call was for and he is aware that he will follow up in the [place] office.  Please advise. Thanks, MI

## 2015-10-16 NOTE — Patient Instructions (Addendum)
We will set up MRI of knee and call you with results. Stop the Celexa and start the Lexapro one daily.

## 2015-10-16 NOTE — Telephone Encounter (Signed)
Nothing else advised - pt didn't need call from RN for appt confirmation return call.

## 2015-10-17 NOTE — Telephone Encounter (Signed)
Spoke with patient regarding Xanax Rx and he will get from PCP He and wife thought that was who pharmacy contacted

## 2015-10-17 NOTE — Telephone Encounter (Signed)
Advised patient, verbalized understanding  

## 2015-10-20 ENCOUNTER — Ambulatory Visit
Admission: RE | Admit: 2015-10-20 | Discharge: 2015-10-20 | Disposition: A | Discharge status: Home / Self Care | Payer: Medicare Other | Source: Ambulatory Visit | Attending: Family Medicine | Admitting: Family Medicine

## 2015-10-20 DIAGNOSIS — M25561 Pain in right knee: Secondary | ICD-10-CM

## 2015-10-20 DIAGNOSIS — S83281A Other tear of lateral meniscus, current injury, right knee, initial encounter: Secondary | ICD-10-CM | POA: Diagnosis not present

## 2015-10-21 ENCOUNTER — Other Ambulatory Visit: Payer: Self-pay | Admitting: Family Medicine

## 2015-10-21 DIAGNOSIS — R936 Abnormal findings on diagnostic imaging of limbs: Secondary | ICD-10-CM

## 2015-10-28 ENCOUNTER — Other Ambulatory Visit: Payer: Self-pay | Admitting: Family Medicine

## 2015-10-28 DIAGNOSIS — M1711 Unilateral primary osteoarthritis, right knee: Secondary | ICD-10-CM | POA: Diagnosis not present

## 2015-10-28 DIAGNOSIS — M25561 Pain in right knee: Secondary | ICD-10-CM | POA: Diagnosis not present

## 2015-10-29 NOTE — Telephone Encounter (Signed)
Dr. Tonia Brooms has been prescribing pts Alprazolam. Are you taking over refills for this pt? Please advise.

## 2015-10-30 ENCOUNTER — Ambulatory Visit (INDEPENDENT_AMBULATORY_CARE_PROVIDER_SITE_OTHER): Payer: Medicare Other | Admitting: Cardiovascular Disease

## 2015-10-30 ENCOUNTER — Encounter: Payer: Self-pay | Admitting: Cardiovascular Disease

## 2015-10-30 ENCOUNTER — Other Ambulatory Visit: Payer: Self-pay | Admitting: Surgical

## 2015-10-30 ENCOUNTER — Other Ambulatory Visit: Payer: Self-pay | Admitting: Family Medicine

## 2015-10-30 VITALS — BP 108/63 | HR 41 | Ht 72.0 in | Wt 214.0 lb

## 2015-10-30 DIAGNOSIS — I10 Essential (primary) hypertension: Secondary | ICD-10-CM | POA: Diagnosis not present

## 2015-10-30 DIAGNOSIS — H2513 Age-related nuclear cataract, bilateral: Secondary | ICD-10-CM | POA: Diagnosis not present

## 2015-10-30 DIAGNOSIS — I359 Nonrheumatic aortic valve disorder, unspecified: Secondary | ICD-10-CM | POA: Diagnosis not present

## 2015-10-30 DIAGNOSIS — Z01818 Encounter for other preprocedural examination: Secondary | ICD-10-CM

## 2015-10-30 DIAGNOSIS — I482 Chronic atrial fibrillation, unspecified: Secondary | ICD-10-CM

## 2015-10-30 NOTE — Patient Instructions (Signed)
Your physician wants you to follow-up in: Randall Lara You will receive a reminder letter in the mail two months in advance. If you don't receive a letter, please call our office to schedule the follow-up appointment.  Your physician recommends that you continue on your current medications as directed. Please refer to the Current Medication list given to you today.  HOLD ELIQUIS 2 DAYS PRIOR TO SURGERY   Thank you for choosing [facility]!!

## 2015-10-30 NOTE — Telephone Encounter (Signed)
We have actually been prescribing this. Refill both for 6 months

## 2015-10-30 NOTE — Progress Notes (Signed)
Patient ID: Randall Lara, male   DOB: February 11, 1930, 82 y.o.   MRN: [id number]      SUBJECTIVE: The patient is an 82 year old male who I am meeting for the first time. He is a former patient of Dr. Mare Ferrari. He has a history of chronic a symptomatically atrial fibrillation and is anticoagulated with Eliquis. He has no known history of ischemic heart disease and underwent a low risk nuclear stress test on 07/07/13. Echocardiogram on 07/01/10 demonstrated normal left ventricular systolic function, EF [id number], with mild aortic regurgitation.  He denies chest pain, palpitations, leg swelling, and shortness of breath. He said his heart rate has remained low and he has had a cardiac murmur since his 30s.   Review of Systems: As per "subjective", otherwise negative.  Allergies  Allergen Reactions  . Ferrous Sulfate     REACTION: swelling, hives  . Sulfa Antibiotics     hives    Current Outpatient Prescriptions  Medication Sig Dispense Refill  . ALPRAZolam (XANAX) 1 MG tablet Take 1 mg by mouth 2 (two) times daily as needed for anxiety.    . benazepril-hydrochlorthiazide (LOTENSIN HCT) 20-12.5 MG tablet Take 0.5 tablets by mouth daily.     Marland Kitchen ELIQUIS 5 MG TABS tablet TAKE 1 TABLET BY MOUTH TWICE DAILY 60 tablet 9  . escitalopram (LEXAPRO) 10 MG tablet Take 1 tablet (10 mg total) by mouth daily. 30 tablet 11  . nitroGLYCERIN (NITROSTAT) 0.4 MG SL tablet Place 1 tablet (0.4 mg total) under the tongue every 5 (five) minutes as needed for chest pain. 25 tablet 11   No current facility-administered medications for this visit.    Past Medical History  Diagnosis Date  . Atrial fibrillation ([place])   . Joint pain   . Obesity   . Hypertension   . Anxiety   . Heart palpitations   . GERD (gastroesophageal reflux disease)     Past Surgical History  Procedure Laterality Date  . Esophagogastroduodenoscopy  03/29/2002    [id number] esophagus, stomach, and duodenum through the second  portion.  Marland Kitchen  Hernia repair      Social History   Social History  . Marital Status: Married    Spouse Name: N/A  . Number of Children: N/A  . Years of Education: N/A   Occupational History  . Not on file.   Social History Main Topics  . Smoking status: Former Smoker -- 1.00 packs/day for 20 years    Types: Cigarettes    Start date: 08/07/1955    Quit date: 08/07/1975  . Smokeless tobacco: Never Used  . Alcohol Use: 0.0 oz/week    0 Standard drinks or equivalent per week     Comment: a few drinks of liquor daily  . Drug Use: No  . Sexual Activity: Not on file   Other Topics Concern  . Not on file   Social History Narrative     Filed Vitals:   10/30/15 0953  BP: 108/63  Pulse: 41  Height: 6' (1.829 m)  Weight: 214 lb (97.07 kg)    PHYSICAL EXAM General: NAD HEENT: Normal. Neck: No JVD, no thyromegaly. Lungs: Clear to auscultation bilaterally with normal respiratory effort. CV: Nondisplaced PMI.  Bradycardic, irregular rhythm, normal S1/S2, no S3, 1/6 systolic murmur over RUSB and apex. No pretibial or periankle edema.     Abdomen: Soft, nontender, no distention.  Neurologic: Alert and oriented.  Psych: Normal affect. Skin: Normal.  ECG: Most recent ECG reviewed.  ASSESSMENT AND PLAN: 1. Atrial fibrillation: Anticoagulated with Eliquis. HR low without AV nodal blocking agents.  2. Essential HTN: Controlled. No changes.  3. Aortic regurgitation: Asymptomatic.  4. Preoperative risk stratification: Can proceed with right knee surgery with an acceptable level of risk. Apixaban can be held 48 hrs beforehand.  Dispo: fu 1 year.   Kate Sable, M.D., F.A.C.C.

## 2015-11-04 NOTE — Patient Instructions (Addendum)
Randall Lara  11/04/2015   Your procedure is scheduled on: 11/13/15  Report to [facility]  Entrance take [facility]  elevators to 3rd floor to  [place] at 6:00 AM.  Call this number if you have problems the morning of surgery [phone]   Remember: ONLY 1 PERSON MAY GO WITH YOU TO SHORT STAY TO GET  READY MORNING OF [name].  Do not eat food or drink liquids :After Midnight.     Take these medicines the morning of surgery with A SIP OF WATER: Lexapro, Xanax                                You may not have any metal on your body including hair pins and              piercings  Do not wear jewelry, make-up, lotions, powders or perfumes, deodorant             Do not wear nail polish.  Do not shave  48 hours prior to surgery.              Men may shave face and neck.   Do not bring valuables to the hospital. [place].  Contacts, dentures or bridgework may not be worn into surgery.  Leave suitcase in the car. After surgery it may be brought to your room.                      _____________________________________________________________________             [facility] - Preparing for Surgery Before surgery, you can play an important role.  Because skin is not sterile, your skin needs to be as free of germs as possible.  You can reduce the number of germs on your skin by washing with CHG (chlorahexidine gluconate) soap before surgery.  CHG is an antiseptic cleaner which kills germs and bonds with the skin to continue killing germs even after washing. Please DO NOT use if you have an allergy to CHG or antibacterial soaps.  If your skin becomes reddened/irritated stop using the CHG and inform your nurse when you arrive at Short Stay. Do not shave (including legs and underarms) for at least 48 hours prior to the first CHG shower.  You may shave your face/neck. Please follow these instructions  carefully:  1.  Shower with CHG Soap the night before surgery and the  morning of Surgery.  2.  If you choose to wash your hair, wash your hair first as usual with your  normal  shampoo.  3.  After you shampoo, rinse your hair and body thoroughly to remove the  shampoo.                           4.  Use CHG as you would any other liquid soap.  You can apply chg directly  to the skin and wash                       Gently with a scrungie or clean washcloth.  5.  Apply the CHG Soap to your body ONLY FROM  THE NECK DOWN.   Do not use on face/ open                           Wound or open sores. Avoid contact with eyes, ears mouth and genitals (private parts).                       Wash face,  Genitals (private parts) with your normal soap.             6.  Wash thoroughly, paying special attention to the area where your surgery  will be performed.  7.  Thoroughly rinse your body with warm water from the neck down.  8.  DO NOT shower/wash with your normal soap after using and rinsing off  the CHG Soap.                9.  Pat yourself dry with a clean towel.            10.  Wear clean pajamas.            11.  Place clean sheets on your bed the night of your first shower and do not  sleep with pets. Day of Surgery : Do not apply any lotions/deodorants the morning of surgery.  Please wear clean clothes to the hospital/surgery center.  FAILURE TO FOLLOW THESE INSTRUCTIONS MAY RESULT IN THE CANCELLATION OF YOUR SURGERY PATIENT SIGNATURE_________________________________  NURSE SIGNATURE__________________________________  ________________________________________________________________________   Randall Lara  An incentive spirometer is a tool that can help keep your lungs clear and active. This tool measures how well you are filling your lungs with each breath. Taking long deep breaths may help reverse or decrease the chance of developing breathing (pulmonary) problems (especially infection)  following:  A long period of time when you are unable to move or be active. BEFORE THE PROCEDURE   If the spirometer includes an indicator to show your best effort, your nurse or respiratory therapist will set it to a desired goal.  If possible, sit up straight or lean slightly forward. Try not to slouch.  Hold the incentive spirometer in an upright position. INSTRUCTIONS FOR USE   Sit on the edge of your bed if possible, or sit up as far as you can in bed or on a chair.  Hold the incentive spirometer in an upright position.  Breathe out normally.  Place the mouthpiece in your mouth and seal your lips tightly around it.  Breathe in slowly and as deeply as possible, raising the piston or the ball toward the top of the column.  Hold your breath for 3-5 seconds or for as long as possible. Allow the piston or ball to fall to the bottom of the column.  Remove the mouthpiece from your mouth and breathe out normally.  Rest for a few seconds and repeat Steps 1 through 7 at least 10 times every 1-2 hours when you are awake. Take your time and take a few normal breaths between deep breaths.  The spirometer may include an indicator to show your best effort. Use the indicator as a goal to work toward during each repetition.  After each set of 10 deep breaths, practice coughing to be sure your lungs are clear. If you have an incision (the cut made at the time of surgery), support your incision when coughing by placing a pillow or rolled up towels firmly against it. Once you are  able to get out of bed, walk around indoors and cough well. You may stop using the incentive spirometer when instructed by your caregiver.  RISKS AND COMPLICATIONS  Take your time so you do not get dizzy or light-headed.  If you are in pain, you may need to take or ask for pain medication before doing incentive spirometry. It is harder to take a deep breath if you are having pain. AFTER USE  Rest and breathe slowly  and easily.  It can be helpful to keep track of a log of your progress. Your caregiver can provide you with a simple table to help with this. If you are using the spirometer at home, follow these instructions: [place] IF:   You are having difficultly using the spirometer.  You have trouble using the spirometer as often as instructed.  Your pain medication is not giving enough relief while using the spirometer.  You develop fever of 100.5 F (38.1 C) or higher. SEEK IMMEDIATE MEDICAL CARE IF:   You cough up bloody sputum that had not been present before.  You develop fever of 102 F (38.9 C) or greater.  You develop worsening pain at or near the incision site. MAKE SURE YOU:   Understand these instructions.  Will watch your condition.  Will get help right away if you are not doing well or get worse. Document Released: 11/09/2006 Document Revised: 09/21/2011 Document Reviewed: 01/10/2007 ExitCare Patient Information 2014 ExitCare, [place].   ________________________________________________________________________  WHAT IS A BLOOD TRANSFUSION? Blood Transfusion Information  A transfusion is the replacement of blood or some of its parts. Blood is made up of multiple cells which provide different functions.  Red blood cells carry oxygen and are used for blood loss replacement.  White blood cells fight against infection.  Platelets control bleeding.  Plasma helps clot blood.  Other blood products are available for specialized needs, such as hemophilia or other clotting disorders. BEFORE THE TRANSFUSION  Who gives blood for transfusions?   Healthy volunteers who are fully evaluated to make sure their blood is safe. This is blood bank blood. Transfusion therapy is the safest it has ever been in the practice of medicine. Before blood is taken from a donor, a complete history is taken to make sure that person has no history of diseases nor engages in risky social  behavior (examples are intravenous drug use or sexual activity with multiple partners). The donor's travel history is screened to minimize risk of transmitting infections, such as malaria. The donated blood is tested for signs of infectious diseases, such as HIV and hepatitis. The blood is then tested to be sure it is compatible with you in order to minimize the chance of a transfusion reaction. If you or a relative donates blood, this is often done in anticipation of surgery and is not appropriate for emergency situations. It takes many days to process the donated blood. RISKS AND COMPLICATIONS Although transfusion therapy is very safe and saves many lives, the main dangers of transfusion include:   Getting an infectious disease.  Developing a transfusion reaction. This is an allergic reaction to something in the blood you were given. Every precaution is taken to prevent this. The decision to have a blood transfusion has been considered carefully by your caregiver before blood is given. Blood is not given unless the benefits outweigh the risks. AFTER THE TRANSFUSION  Right after receiving a blood transfusion, you will usually feel much better and more energetic. This is especially  true if your red blood cells have gotten low (anemic). The transfusion raises the level of the red blood cells which carry oxygen, and this usually causes an energy increase.  The nurse administering the transfusion will monitor you carefully for complications. HOME CARE INSTRUCTIONS  No special instructions are needed after a transfusion. You may find your energy is better. Speak with your caregiver about any limitations on activity for underlying diseases you may have. SEEK MEDICAL CARE IF:   Your condition is not improving after your transfusion.  You develop redness or irritation at the intravenous (IV) site. SEEK IMMEDIATE MEDICAL CARE IF:  Any of the following symptoms occur over the next 12 hours:  Shaking  chills.  You have a temperature by mouth above 102 F (38.9 C), not controlled by medicine.  Chest, back, or muscle pain.  People around you feel you are not acting correctly or are confused.  Shortness of breath or difficulty breathing.  Dizziness and fainting.  You get a rash or develop hives.  You have a decrease in urine output.  Your urine turns a dark color or changes to pink, red, or brown. Any of the following symptoms occur over the next 10 days:  You have a temperature by mouth above 102 F (38.9 C), not controlled by medicine.  Shortness of breath.  Weakness after normal activity.  The white part of the eye turns yellow (jaundice).  You have a decrease in the amount of urine or are urinating less often.  Your urine turns a dark color or changes to pink, red, or brown. Document Released: 06/26/2000 Document Revised: 09/21/2011 Document Reviewed: 02/13/2008 [facility] Patient Information 2014 [place], [place].  _______________________________________________________________________

## 2015-11-05 ENCOUNTER — Encounter (HOSPITAL_COMMUNITY)
Admission: RE | Admit: 2015-11-05 | Discharge: 2015-11-05 | Disposition: A | Discharge status: Home / Self Care | Payer: Medicare Other | Source: Ambulatory Visit | Attending: Orthopedic Surgery | Admitting: Orthopedic Surgery

## 2015-11-05 ENCOUNTER — Ambulatory Visit (HOSPITAL_COMMUNITY)
Admission: RE | Admit: 2015-11-05 | Discharge: 2015-11-05 | Disposition: A | Discharge status: Home / Self Care | Payer: Medicare Other | Source: Ambulatory Visit | Attending: Surgical | Admitting: Surgical

## 2015-11-05 ENCOUNTER — Encounter (HOSPITAL_COMMUNITY): Payer: Self-pay

## 2015-11-05 DIAGNOSIS — J449 Chronic obstructive pulmonary disease, unspecified: Secondary | ICD-10-CM | POA: Insufficient documentation

## 2015-11-05 DIAGNOSIS — Z01818 Encounter for other preprocedural examination: Secondary | ICD-10-CM

## 2015-11-05 HISTORY — DX: Cardiac arrhythmia, unspecified: I49.9

## 2015-11-05 HISTORY — DX: Major depressive disorder, single episode, unspecified: F32.9

## 2015-11-05 HISTORY — DX: Depression, unspecified: F32.A

## 2015-11-05 LAB — CBC WITH DIFFERENTIAL/PLATELET
Basophils Absolute: 0 10*3/uL (ref 0.0–0.1)
Basophils Relative: 1 %
Eosinophils Absolute: 0.2 10*3/uL (ref 0.0–0.7)
Eosinophils Relative: 3 %
HCT: 40.5 % (ref 39.0–52.0)
Hemoglobin: 13.4 g/dL (ref 13.0–17.0)
Lymphocytes Relative: 40 %
Lymphs Abs: 1.9 10*3/uL (ref 0.7–4.0)
MCH: 31.5 pg (ref 26.0–34.0)
MCHC: 33.1 g/dL (ref 30.0–36.0)
MCV: 95.3 fL (ref 78.0–100.0)
Monocytes Absolute: 0.6 10*3/uL (ref 0.1–1.0)
Monocytes Relative: 12 %
Neutro Abs: 2.1 10*3/uL (ref 1.7–7.7)
Neutrophils Relative %: 45 %
Platelets: 112 10*3/uL — ABNORMAL LOW (ref 150–400)
RBC: 4.25 MIL/uL (ref 4.22–5.81)
RDW: 14.3 % (ref 11.5–15.5)
WBC: 4.7 10*3/uL (ref 4.0–10.5)

## 2015-11-05 LAB — COMPREHENSIVE METABOLIC PANEL
ALT: 21 U/L (ref 17–63)
AST: 34 U/L (ref 15–41)
Albumin: 3.9 g/dL (ref 3.5–5.0)
Alkaline Phosphatase: 71 U/L (ref 38–126)
Anion gap: 10 (ref 5–15)
BUN: 18 mg/dL (ref 6–20)
CO2: 27 mmol/L (ref 22–32)
Calcium: 9.5 mg/dL (ref 8.9–10.3)
Chloride: 106 mmol/L (ref 101–111)
Creatinine, Ser: 0.91 mg/dL (ref 0.61–1.24)
GFR calc Af Amer: >60 mL/min (ref >60)
GFR calc non Af Amer: >60 mL/min (ref >60)
Glucose, Bld: 109 mg/dL — ABNORMAL HIGH (ref 65–99)
Potassium: 5.1 mmol/L (ref 3.5–5.1)
Sodium: 143 mmol/L (ref 135–145)
Total Bilirubin: 0.6 mg/dL (ref 0.3–1.2)
Total Protein: 6.7 g/dL (ref 6.5–8.1)

## 2015-11-05 LAB — PROTIME-INR
INR: 1.29 (ref 0.00–1.49)
Prothrombin Time: 16.2 seconds — ABNORMAL HIGH (ref 11.6–15.2)

## 2015-11-05 LAB — ABO/RH: ABO/RH(D): O POS

## 2015-11-05 LAB — URINALYSIS, ROUTINE W REFLEX MICROSCOPIC
Bilirubin Urine: NEGATIVE
Glucose, UA: NEGATIVE mg/dL
Hgb urine dipstick: NEGATIVE
Ketones, ur: NEGATIVE mg/dL
Leukocytes, UA: NEGATIVE
Nitrite: NEGATIVE
Protein, ur: NEGATIVE mg/dL
Specific Gravity, Urine: 1.021 (ref 1.005–1.030)
pH: 5 (ref 5.0–8.0)

## 2015-11-05 LAB — SURGICAL PCR SCREEN
MRSA, PCR: NEGATIVE
Staphylococcus aureus: NEGATIVE

## 2015-11-05 LAB — APTT: aPTT: 36 seconds (ref 24–37)

## 2015-11-05 NOTE — Pre-Procedure Instructions (Signed)
11/05/15 11:50 am- Labs viewable in EPIC. Note platelets and PT. Note routed per EPIC to Dr. Gladstone Lighter- [phone]

## 2015-11-05 NOTE — Pre-Procedure Instructions (Signed)
EKG 08/2015 in [facility]

## 2015-11-11 NOTE — Pre-Procedure Instructions (Addendum)
11-11-15 1545 Voice mail left to inform of Platelet count 112 on preop visit 11-05-15. Platelets were the same on a 2'17 lab check. Pt using Eliquis for history of Atrial Fibrillation. Will recheck PT/INR AM of preop.11-11-15 1600 Recheck CBC/platelets AM of with PT/INR per Dr. Jenita Seashore.

## 2015-11-12 NOTE — Anesthesia Preprocedure Evaluation (Addendum)
Anesthesia Evaluation  Patient identified by MRN, date of birth, ID band Patient awake    Reviewed: Allergy & Precautions, NPO status , Patient's Chart, lab work & pertinent test results  Airway Mallampati: II   Neck ROM: Full    Dental  (+) Partial Upper   Pulmonary neg pulmonary ROS, former smoker (quit 1977 20 pack years),    breath sounds clear to auscultation       Cardiovascular hypertension, Pt. on medications negative cardio ROS  + dysrhythmias Atrial Fibrillation  Rhythm:Irregular  ECHO 2011 EF 70%, STRESS 2014 Low Risk   Neuro/Psych Anxiety negative neurological ROS  negative psych ROS   GI/Hepatic negative GI ROS, Neg liver ROS, GERD  Medicated,  Endo/Other  negative endocrine ROS  Renal/GU negative Renal ROS  negative genitourinary   Musculoskeletal negative musculoskeletal ROS (+)   Abdominal   Peds negative pediatric ROS (+)  Hematology negative hematology ROS (+) 13/40   Anesthesia Other Findings Should be holding Eliquis , held since Sunday  Reproductive/Obstetrics negative OB ROS                           Anesthesia Physical Anesthesia Plan  ASA: III  Anesthesia Plan: General   Post-op Pain Management:    Induction: Intravenous  Airway Management Planned: Oral ETT  Additional Equipment:   Intra-op Plan:   Post-operative Plan: Extubation in OR  Informed Consent: I have reviewed the patients History and Physical, chart, labs and discussed the procedure including the risks, benefits and alternatives for the proposed anesthesia with the patient or authorized representative who has indicated his/her understanding and acceptance.     Plan Discussed with:   Anesthesia Plan Comments: (HX AF, on Eliquis, To be held)        Anesthesia Quick Evaluation

## 2015-11-12 NOTE — H&P (Signed)
TOTAL KNEE ADMISSION H&P  Patient is being admitted for right total knee arthroplasty.  Subjective:  Chief Complaint:right knee pain.  HPI: Randall Lara, 82 y.o. male, has a history of pain and functional disability in the right knee due to arthritis and has failed non-surgical conservative treatments for greater than 12 weeks to includeNSAID's and/or analgesics, corticosteriod injections, flexibility and strengthening excercises, use of assistive devices and activity modification.  Onset of symptoms was gradual, starting >10 years ago with gradually worsening course since that time. The patient noted prior procedures on the knee to include  arthroscopy and menisectomy on the right knee(s).  Patient currently rates pain in the right knee(s) at 7 out of 10 with activity. Patient has night pain, worsening of pain with activity and weight bearing, pain that interferes with activities of daily living, pain with passive range of motion, crepitus and joint swelling.  Patient has evidence of periarticular osteophytes and joint space narrowing by imaging studies.  There is no active infection.  Patient Active Problem List   Diagnosis Date Noted  . Aortic valve disease 08/23/2014  . Osteoarthritis of both knees 08/23/2014  . History of atrial fibrillation 05/15/2014  . Prediabetes 11/14/2013  . Chest pain 06/16/2013  . Obesity (BMI 30-39.9) 05/15/2013  . Globus sensation 12/01/2012  . GERD (gastroesophageal reflux disease) 11/10/2012  . Joint pain   . Obesity   . Heart palpitations   . HYPERGLYCEMIA 05/08/2010  . Anxiety state 02/11/2009  . Essential hypertension 02/11/2009   Past Medical History  Diagnosis Date  . Atrial fibrillation ([place])   . Joint pain   . Obesity   . Hypertension   . Anxiety   . Heart palpitations   . Dysrhythmia     atrial fib  . Depression   . GERD (gastroesophageal reflux disease)     no meds needed- diet controlled    Past Surgical History  Procedure  Laterality Date  . Esophagogastroduodenoscopy  03/29/2002    [id number] esophagus, stomach, and duodenum through the second  portion.  Marland Kitchen Hernia repair       Current outpatient prescriptions:  .  ALPRAZolam (XANAX) 1 MG tablet, TAKE 1 TABLET BY MOUTH TWICE DAILY AS NEEDED (Patient taking differently: TAKE 1 TABLET BY MOUTH TWICE DAILY AS NEEDED FOR ANXIETY.), Disp: 60 tablet, Rfl: 5 .  benazepril-hydrochlorthiazide (LOTENSIN HCT) 20-12.5 MG tablet, Take 0.5 tablets by mouth daily. , Disp: , Rfl:  .  ELIQUIS 5 MG TABS tablet, TAKE 1 TABLET BY MOUTH TWICE DAILY, Disp: 60 tablet, Rfl: 9 .  escitalopram (LEXAPRO) 10 MG tablet, Take 1 tablet (10 mg total) by mouth daily., Disp: 30 tablet, Rfl: 11 .  nitroGLYCERIN (NITROSTAT) 0.4 MG SL tablet, Place 1 tablet (0.4 mg total) under the tongue every 5 (five) minutes as needed for chest pain., Disp: 25 tablet, Rfl: 11  Allergies  Allergen Reactions  . Ferrous Sulfate     REACTION: swelling, hives. Doesn't recall.  . Sulfa Antibiotics Hives and Swelling    Social History  Substance Use Topics  . Smoking status: Former Smoker -- 1.00 packs/day for 20 years    Types: Cigarettes    Start date: 08/07/1955    Quit date: 08/07/1975  . Smokeless tobacco: Never Used  . Alcohol Use: No    Family History  Problem Relation Age of Onset  . Stroke Mother   . Stroke Father   . Stroke Sister   . Colon cancer Neg Hx   . Heart  attack Brother   . Hypertension Mother   . Hypertension Sister      Review of Systems  Constitutional: Negative.   HENT: Positive for hearing loss. Negative for congestion, ear discharge, ear pain, nosebleeds, sore throat and tinnitus.   Eyes: Negative.   Respiratory: Positive for shortness of breath. Negative for cough, hemoptysis, sputum production, wheezing and stridor.   Cardiovascular: Negative.   Gastrointestinal: Positive for heartburn. Negative for nausea, vomiting, abdominal pain, diarrhea, constipation, blood in stool  and melena.  Genitourinary: Negative.   Musculoskeletal: Positive for myalgias, back pain and joint pain. Negative for falls and neck pain.  Skin: Negative.   Neurological: Negative.  Negative for headaches.  Endo/Heme/Allergies: Negative.   Psychiatric/Behavioral: Negative.     Objective:  Physical Exam  Constitutional: He is oriented to person, place, and time. He appears well-developed. No distress.  Overweight  HENT:  Head: Normocephalic and atraumatic.  Right Ear: External ear normal.  Left Ear: External ear normal.  Nose: Nose normal.  Mouth/Throat: Oropharynx is clear and moist.  Eyes: Conjunctivae and EOM are normal.  Neck: Normal range of motion. Neck supple.  Cardiovascular: Normal rate and intact distal pulses.  An irregularly irregular rhythm present.  Murmur heard.  Systolic murmur is present with a grade of 3/6  Respiratory: Effort normal and breath sounds normal. No respiratory distress. He has no wheezes.  GI: Soft. Bowel sounds are normal. He exhibits no distension. There is no tenderness.  Musculoskeletal:       Right hip: Normal.       Left hip: Normal.  Neurological: He is alert and oriented to person, place, and time. He has normal strength and normal reflexes. No sensory deficit.  Skin: No rash noted. He is not diaphoretic. No erythema.  Psychiatric: He has a normal mood and affect. His behavior is normal.   Vitals Weight: 210 lb Height: 72in Body Surface Area: 2.18 m Body Mass Index: 28.48 kg/m  Pulse: 54 (Regular)  BP: 115/70 (Sitting, Left Arm, Standard)   Imaging Review Plain radiographs demonstrate severe degenerative joint disease of the right knee(s). The overall alignment ismild valgus. The bone quality appears to be good for age and reported activity level.  Assessment/Plan:  End stage primary osteoarthritis, right knee   The patient history, physical examination, clinical judgment of the provider and imaging studies are  consistent with end stage degenerative joint disease of the right knee(s) and total knee arthroplasty is deemed medically necessary. The treatment options including medical management, injection therapy arthroscopy and arthroplasty were discussed at length. The risks and benefits of total knee arthroplasty were presented and reviewed. The risks due to aseptic loosening, infection, stiffness, patella tracking problems, thromboembolic complications and other imponderables were discussed. The patient acknowledged the explanation, agreed to proceed with the plan and consent was signed. Patient is being admitted for inpatient treatment for surgery, pain control, PT, OT, prophylactic antibiotics, VTE prophylaxis, progressive ambulation and ADL's and discharge planning. The patient is planning to be discharged home with home health services    PCP: Dr. Jarold Song Cardio: Dr. Shane Crutch, PA-C

## 2015-11-13 ENCOUNTER — Inpatient Hospital Stay (HOSPITAL_COMMUNITY): Payer: Medicare Other | Admitting: Anesthesiology

## 2015-11-13 ENCOUNTER — Encounter (HOSPITAL_COMMUNITY)
Admission: RE | Disposition: A | Discharge status: Skilled Nursing Facility | Payer: Self-pay | Source: Ambulatory Visit | Attending: Orthopedic Surgery

## 2015-11-13 ENCOUNTER — Inpatient Hospital Stay (HOSPITAL_COMMUNITY)
Admission: RE | Admit: 2015-11-13 | Discharge: 2015-11-16 | DRG: 470 | Disposition: A | Discharge status: Skilled Nursing Facility | Payer: Medicare Other | Source: Ambulatory Visit | Attending: Orthopedic Surgery | Admitting: Orthopedic Surgery

## 2015-11-13 ENCOUNTER — Encounter (HOSPITAL_COMMUNITY): Payer: Self-pay | Admitting: Anesthesiology

## 2015-11-13 DIAGNOSIS — K219 Gastro-esophageal reflux disease without esophagitis: Secondary | ICD-10-CM | POA: Diagnosis not present

## 2015-11-13 DIAGNOSIS — Z7901 Long term (current) use of anticoagulants: Secondary | ICD-10-CM | POA: Diagnosis not present

## 2015-11-13 DIAGNOSIS — Z79899 Other long term (current) drug therapy: Secondary | ICD-10-CM | POA: Diagnosis not present

## 2015-11-13 DIAGNOSIS — Z96659 Presence of unspecified artificial knee joint: Secondary | ICD-10-CM

## 2015-11-13 DIAGNOSIS — M170 Bilateral primary osteoarthritis of knee: Principal | ICD-10-CM | POA: Diagnosis present

## 2015-11-13 DIAGNOSIS — M179 Osteoarthritis of knee, unspecified: Secondary | ICD-10-CM | POA: Diagnosis not present

## 2015-11-13 DIAGNOSIS — M14861 Arthropathies in other specified diseases classified elsewhere, right knee: Secondary | ICD-10-CM | POA: Diagnosis not present

## 2015-11-13 DIAGNOSIS — I10 Essential (primary) hypertension: Secondary | ICD-10-CM | POA: Diagnosis not present

## 2015-11-13 DIAGNOSIS — H9190 Unspecified hearing loss, unspecified ear: Secondary | ICD-10-CM | POA: Diagnosis not present

## 2015-11-13 DIAGNOSIS — M2341 Loose body in knee, right knee: Secondary | ICD-10-CM | POA: Diagnosis not present

## 2015-11-13 DIAGNOSIS — L0390 Cellulitis, unspecified: Secondary | ICD-10-CM | POA: Diagnosis not present

## 2015-11-13 DIAGNOSIS — Z87891 Personal history of nicotine dependence: Secondary | ICD-10-CM

## 2015-11-13 DIAGNOSIS — Z6828 Body mass index (BMI) 28.0-28.9, adult: Secondary | ICD-10-CM

## 2015-11-13 DIAGNOSIS — I358 Other nonrheumatic aortic valve disorders: Secondary | ICD-10-CM | POA: Diagnosis not present

## 2015-11-13 DIAGNOSIS — M6281 Muscle weakness (generalized): Secondary | ICD-10-CM | POA: Diagnosis not present

## 2015-11-13 DIAGNOSIS — Z471 Aftercare following joint replacement surgery: Secondary | ICD-10-CM | POA: Diagnosis not present

## 2015-11-13 DIAGNOSIS — E669 Obesity, unspecified: Secondary | ICD-10-CM | POA: Diagnosis present

## 2015-11-13 DIAGNOSIS — Z96651 Presence of right artificial knee joint: Secondary | ICD-10-CM | POA: Diagnosis not present

## 2015-11-13 DIAGNOSIS — R079 Chest pain, unspecified: Secondary | ICD-10-CM | POA: Diagnosis not present

## 2015-11-13 DIAGNOSIS — F419 Anxiety disorder, unspecified: Secondary | ICD-10-CM | POA: Diagnosis present

## 2015-11-13 DIAGNOSIS — M1711 Unilateral primary osteoarthritis, right knee: Secondary | ICD-10-CM | POA: Diagnosis not present

## 2015-11-13 DIAGNOSIS — M25561 Pain in right knee: Secondary | ICD-10-CM | POA: Diagnosis present

## 2015-11-13 DIAGNOSIS — I4891 Unspecified atrial fibrillation: Secondary | ICD-10-CM | POA: Diagnosis not present

## 2015-11-13 DIAGNOSIS — R2689 Other abnormalities of gait and mobility: Secondary | ICD-10-CM | POA: Diagnosis not present

## 2015-11-13 DIAGNOSIS — I482 Chronic atrial fibrillation: Secondary | ICD-10-CM | POA: Diagnosis not present

## 2015-11-13 DIAGNOSIS — K5900 Constipation, unspecified: Secondary | ICD-10-CM | POA: Diagnosis not present

## 2015-11-13 HISTORY — PX: TOTAL KNEE ARTHROPLASTY: SHX125

## 2015-11-13 LAB — CBC
HCT: 38.5 % — ABNORMAL LOW (ref 39.0–52.0)
Hemoglobin: 12.9 g/dL — ABNORMAL LOW (ref 13.0–17.0)
MCH: 32.3 pg (ref 26.0–34.0)
MCHC: 33.5 g/dL (ref 30.0–36.0)
MCV: 96.3 fL (ref 78.0–100.0)
PLATELETS: 105 10*3/uL — AB (ref 150–400)
RBC: 4 MIL/uL — ABNORMAL LOW (ref 4.22–5.81)
RDW: 14.1 % (ref 11.5–15.5)
WBC: 4.6 10*3/uL (ref 4.0–10.5)

## 2015-11-13 LAB — PROTIME-INR
INR: 1.15 (ref 0.00–1.49)
PROTHROMBIN TIME: 14.4 s (ref 11.6–15.2)

## 2015-11-13 LAB — TYPE AND SCREEN
ABO/RH(D): O POS
Antibody Screen: NEGATIVE

## 2015-11-13 SURGERY — ARTHROPLASTY, KNEE, TOTAL
Anesthesia: General | Site: Knee | Laterality: Right

## 2015-11-13 MED ORDER — ALPRAZOLAM 1 MG PO TABS
1.0000 mg | ORAL_TABLET | Freq: Two times a day (BID) | ORAL | Status: DC | PRN
  Administered 2015-11-14 – 2015-11-15 (×2): 1 mg via ORAL
  Filled 2015-11-13 (×2): qty 1

## 2015-11-13 MED ORDER — LIDOCAINE HCL (CARDIAC) 20 MG/ML IV SOLN
INTRAVENOUS | Status: AC
  Filled 2015-11-13: qty 5

## 2015-11-13 MED ORDER — CHLORHEXIDINE GLUCONATE 4 % EX LIQD: 60.0000 mL | Freq: Once | CUTANEOUS | Status: DC

## 2015-11-13 MED ORDER — OXYCODONE-ACETAMINOPHEN 5-325 MG PO TABS
2.0000 | ORAL_TABLET | ORAL | Status: DC | PRN
  Administered 2015-11-13: 2 via ORAL
  Administered 2015-11-14: 1 via ORAL
  Administered 2015-11-14: 2 via ORAL
  Administered 2015-11-14: 1 via ORAL
  Filled 2015-11-13 (×4): qty 2

## 2015-11-13 MED ORDER — HYDROCODONE-ACETAMINOPHEN 5-325 MG PO TABS
1.0000 | ORAL_TABLET | ORAL | Status: DC | PRN
  Administered 2015-11-13 – 2015-11-14 (×2): 2 via ORAL
  Filled 2015-11-13 (×2): qty 2

## 2015-11-13 MED ORDER — ONDANSETRON HCL 4 MG/2ML IJ SOLN: 4.0000 mg | Freq: Once | INTRAMUSCULAR | Status: DC

## 2015-11-13 MED ORDER — PHENYLEPHRINE 40 MCG/ML (10ML) SYRINGE FOR IV PUSH (FOR BLOOD PRESSURE SUPPORT)
PREFILLED_SYRINGE | INTRAVENOUS | Status: AC
  Filled 2015-11-13: qty 10

## 2015-11-13 MED ORDER — POLYETHYLENE GLYCOL 3350 17 G PO PACK: 17.0000 g | PACK | Freq: Every day | ORAL | Status: DC | PRN

## 2015-11-13 MED ORDER — ONDANSETRON HCL 4 MG/2ML IJ SOLN
INTRAMUSCULAR | Status: AC
  Filled 2015-11-13: qty 2

## 2015-11-13 MED ORDER — ACETAMINOPHEN 10 MG/ML IV SOLN
INTRAVENOUS | Status: AC
Start: 2015-11-13 — End: 2015-11-13
  Filled 2015-11-13: qty 100

## 2015-11-13 MED ORDER — NITROGLYCERIN 0.4 MG SL SUBL: 0.4000 mg | SUBLINGUAL_TABLET | SUBLINGUAL | Status: DC | PRN

## 2015-11-13 MED ORDER — METHOCARBAMOL 500 MG PO TABS
500.0000 mg | ORAL_TABLET | Freq: Four times a day (QID) | ORAL | Status: DC | PRN
  Administered 2015-11-15: 500 mg via ORAL
  Filled 2015-11-13: qty 1

## 2015-11-13 MED ORDER — ACETAMINOPHEN 325 MG PO TABS
650.0000 mg | ORAL_TABLET | Freq: Four times a day (QID) | ORAL | Status: DC | PRN
  Administered 2015-11-14 – 2015-11-16 (×2): 650 mg via ORAL
  Filled 2015-11-13 (×2): qty 2

## 2015-11-13 MED ORDER — ATROPINE SULFATE 0.4 MG/ML IJ SOLN
INTRAMUSCULAR | Status: DC | PRN
  Administered 2015-11-13: .3 mg via INTRAVENOUS

## 2015-11-13 MED ORDER — BUPIVACAINE HCL (PF) 0.25 % IJ SOLN
INTRAMUSCULAR | Status: AC
  Filled 2015-11-13: qty 30

## 2015-11-13 MED ORDER — ONDANSETRON HCL 4 MG/2ML IJ SOLN
INTRAMUSCULAR | Status: DC | PRN
  Administered 2015-11-13: 4 mg via INTRAVENOUS

## 2015-11-13 MED ORDER — ALUM & MAG HYDROXIDE-SIMETH 200-200-20 MG/5ML PO SUSP: 30.0000 mL | ORAL | Status: DC | PRN

## 2015-11-13 MED ORDER — FLEET ENEMA 7-19 GM/118ML RE ENEM: 1.0000 | ENEMA | Freq: Once | RECTAL | Status: DC | PRN

## 2015-11-13 MED ORDER — THROMBIN 5000 UNITS EX SOLR
CUTANEOUS | Status: AC
  Filled 2015-11-13: qty 5000

## 2015-11-13 MED ORDER — PROPOFOL 10 MG/ML IV BOLUS
INTRAVENOUS | Status: DC | PRN
  Administered 2015-11-13: 150 mg via INTRAVENOUS

## 2015-11-13 MED ORDER — BUPIVACAINE LIPOSOME 1.3 % IJ SUSP
20.0000 mL | Freq: Once | INTRAMUSCULAR | Status: DC
  Filled 2015-11-13: qty 20

## 2015-11-13 MED ORDER — ATROPINE SULFATE 0.4 MG/ML IJ SOLN
INTRAMUSCULAR | Status: AC
Start: 2015-11-13 — End: 2015-11-13
  Filled 2015-11-13: qty 1

## 2015-11-13 MED ORDER — ACETAMINOPHEN 10 MG/ML IV SOLN
INTRAVENOUS | Status: DC | PRN
  Administered 2015-11-13: 1000 mg via INTRAVENOUS

## 2015-11-13 MED ORDER — LACTATED RINGERS IV SOLN
INTRAVENOUS | Status: DC | PRN
  Administered 2015-11-13 (×2): via INTRAVENOUS

## 2015-11-13 MED ORDER — MENTHOL 3 MG MT LOZG: 1.0000 | LOZENGE | OROMUCOSAL | Status: DC | PRN

## 2015-11-13 MED ORDER — BUPIVACAINE LIPOSOME 1.3 % IJ SUSP
INTRAMUSCULAR | Status: DC | PRN
  Administered 2015-11-13: 20 mL

## 2015-11-13 MED ORDER — MEPERIDINE HCL 50 MG/ML IJ SOLN: 6.2500 mg | INTRAMUSCULAR | Status: DC | PRN

## 2015-11-13 MED ORDER — EPHEDRINE SULFATE 50 MG/ML IJ SOLN
INTRAMUSCULAR | Status: DC | PRN
  Administered 2015-11-13 (×2): 10 mg via INTRAVENOUS

## 2015-11-13 MED ORDER — CEFAZOLIN SODIUM-DEXTROSE 2-4 GM/100ML-% IV SOLN
INTRAVENOUS | Status: AC
Start: 2015-11-13 — End: 2015-11-13
  Filled 2015-11-13: qty 100

## 2015-11-13 MED ORDER — ACETAMINOPHEN 650 MG RE SUPP
650.0000 mg | Freq: Four times a day (QID) | RECTAL | Status: DC | PRN
Start: 2015-11-13 — End: 2015-11-16

## 2015-11-13 MED ORDER — SUFENTANIL CITRATE 50 MCG/ML IV SOLN
INTRAVENOUS | Status: DC | PRN
  Administered 2015-11-13 (×5): 10 ug via INTRAVENOUS

## 2015-11-13 MED ORDER — EPHEDRINE SULFATE 50 MG/ML IJ SOLN
INTRAMUSCULAR | Status: AC
  Filled 2015-11-13: qty 1

## 2015-11-13 MED ORDER — BENAZEPRIL HCL 20 MG PO TABS
20.0000 mg | ORAL_TABLET | Freq: Every day | ORAL | Status: DC
  Administered 2015-11-13 – 2015-11-16 (×4): 20 mg via ORAL
  Filled 2015-11-13 (×4): qty 1

## 2015-11-13 MED ORDER — ONDANSETRON HCL 4 MG/2ML IJ SOLN: 4.0000 mg | Freq: Four times a day (QID) | INTRAMUSCULAR | Status: DC | PRN

## 2015-11-13 MED ORDER — SUFENTANIL CITRATE 50 MCG/ML IV SOLN
INTRAVENOUS | Status: AC
  Filled 2015-11-13: qty 1

## 2015-11-13 MED ORDER — PHENOL 1.4 % MT LIQD
1.0000 | OROMUCOSAL | Status: DC | PRN
  Filled 2015-11-13: qty 177

## 2015-11-13 MED ORDER — ONDANSETRON HCL 4 MG PO TABS: 4.0000 mg | ORAL_TABLET | Freq: Four times a day (QID) | ORAL | Status: DC | PRN

## 2015-11-13 MED ORDER — ROCURONIUM BROMIDE 100 MG/10ML IV SOLN
INTRAVENOUS | Status: AC
  Filled 2015-11-13: qty 1

## 2015-11-13 MED ORDER — CEFAZOLIN SODIUM 1-5 GM-% IV SOLN
1.0000 g | Freq: Four times a day (QID) | INTRAVENOUS | Status: AC
  Administered 2015-11-13 (×2): 1 g via INTRAVENOUS
  Filled 2015-11-13 (×2): qty 50

## 2015-11-13 MED ORDER — BUPIVACAINE HCL (PF) 0.25 % IJ SOLN
INTRAMUSCULAR | Status: DC | PRN
  Administered 2015-11-13: 20 mL

## 2015-11-13 MED ORDER — HYDROMORPHONE HCL 1 MG/ML IJ SOLN
0.5000 mg | INTRAMUSCULAR | Status: DC | PRN
  Administered 2015-11-13: 0.5 mg via INTRAVENOUS
  Filled 2015-11-13: qty 1

## 2015-11-13 MED ORDER — CEFAZOLIN SODIUM-DEXTROSE 2-4 GM/100ML-% IV SOLN
2.0000 g | INTRAVENOUS | Status: AC
  Administered 2015-11-13: 2 g via INTRAVENOUS
  Filled 2015-11-13: qty 100

## 2015-11-13 MED ORDER — LACTATED RINGERS IV SOLN
INTRAVENOUS | Status: DC
  Administered 2015-11-13 – 2015-11-14 (×2): via INTRAVENOUS

## 2015-11-13 MED ORDER — SODIUM CHLORIDE 0.9 % IR SOLN
Status: AC
  Filled 2015-11-13: qty 1

## 2015-11-13 MED ORDER — METHOCARBAMOL 1000 MG/10ML IJ SOLN
500.0000 mg | Freq: Four times a day (QID) | INTRAVENOUS | Status: DC | PRN
  Administered 2015-11-13: 500 mg via INTRAVENOUS
  Filled 2015-11-13: qty 550
  Filled 2015-11-13: qty 5

## 2015-11-13 MED ORDER — BISACODYL 5 MG PO TBEC: 5.0000 mg | DELAYED_RELEASE_TABLET | Freq: Every day | ORAL | Status: DC | PRN

## 2015-11-13 MED ORDER — SODIUM CHLORIDE 0.9 % IJ SOLN
INTRAMUSCULAR | Status: AC
  Filled 2015-11-13: qty 10

## 2015-11-13 MED ORDER — SUGAMMADEX SODIUM 200 MG/2ML IV SOLN
INTRAVENOUS | Status: DC | PRN
  Administered 2015-11-13: 200 mg via INTRAVENOUS

## 2015-11-13 MED ORDER — BENAZEPRIL-HYDROCHLOROTHIAZIDE 20-12.5 MG PO TABS: 0.5000 | ORAL_TABLET | Freq: Every day | ORAL | Status: DC

## 2015-11-13 MED ORDER — ESCITALOPRAM OXALATE 10 MG PO TABS
10.0000 mg | ORAL_TABLET | Freq: Every day | ORAL | Status: DC
  Administered 2015-11-13 – 2015-11-16 (×4): 10 mg via ORAL
  Filled 2015-11-13 (×4): qty 1

## 2015-11-13 MED ORDER — SUGAMMADEX SODIUM 200 MG/2ML IV SOLN
INTRAVENOUS | Status: AC
  Filled 2015-11-13: qty 2

## 2015-11-13 MED ORDER — APIXABAN 2.5 MG PO TABS
2.5000 mg | ORAL_TABLET | Freq: Two times a day (BID) | ORAL | Status: DC
  Administered 2015-11-14 – 2015-11-16 (×5): 2.5 mg via ORAL
  Filled 2015-11-13 (×7): qty 1

## 2015-11-13 MED ORDER — FENTANYL CITRATE (PF) 100 MCG/2ML IJ SOLN
25.0000 ug | INTRAMUSCULAR | Status: DC | PRN
  Administered 2015-11-13 (×2): 50 ug via INTRAVENOUS

## 2015-11-13 MED ORDER — LACTATED RINGERS IV SOLN: INTRAVENOUS | Status: DC

## 2015-11-13 MED ORDER — PROPOFOL 10 MG/ML IV BOLUS
INTRAVENOUS | Status: AC
  Filled 2015-11-13: qty 20

## 2015-11-13 MED ORDER — SODIUM CHLORIDE 0.9 % IJ SOLN
INTRAMUSCULAR | Status: AC
  Filled 2015-11-13: qty 20

## 2015-11-13 MED ORDER — SODIUM CHLORIDE 0.9 % IV SOLN
1000.0000 mg | INTRAVENOUS | Status: AC
  Administered 2015-11-13: 1000 mg via INTRAVENOUS
  Filled 2015-11-13: qty 10

## 2015-11-13 MED ORDER — SODIUM CHLORIDE 0.9 % IJ SOLN
INTRAMUSCULAR | Status: DC | PRN
  Administered 2015-11-13: 20 mL

## 2015-11-13 MED ORDER — ROCURONIUM BROMIDE 100 MG/10ML IV SOLN
INTRAVENOUS | Status: DC | PRN
  Administered 2015-11-13: 60 mg via INTRAVENOUS
  Administered 2015-11-13: 10 mg via INTRAVENOUS

## 2015-11-13 MED ORDER — BACITRACIN-NEOMYCIN-POLYMYXIN 400-5-5000 EX OINT
TOPICAL_OINTMENT | CUTANEOUS | Status: AC
  Filled 2015-11-13: qty 1

## 2015-11-13 MED ORDER — FENTANYL CITRATE (PF) 100 MCG/2ML IJ SOLN
INTRAMUSCULAR | Status: AC
  Filled 2015-11-13: qty 2

## 2015-11-13 MED ORDER — HYDROCHLOROTHIAZIDE 12.5 MG PO CAPS
12.5000 mg | ORAL_CAPSULE | Freq: Every day | ORAL | Status: DC
  Administered 2015-11-13 – 2015-11-16 (×4): 12.5 mg via ORAL
  Filled 2015-11-13 (×4): qty 1

## 2015-11-13 MED ORDER — DEXAMETHASONE SODIUM PHOSPHATE 10 MG/ML IJ SOLN
INTRAMUSCULAR | Status: DC | PRN
  Administered 2015-11-13: 10 mg via INTRAVENOUS

## 2015-11-13 MED ORDER — DEXAMETHASONE SODIUM PHOSPHATE 10 MG/ML IJ SOLN
INTRAMUSCULAR | Status: AC
  Filled 2015-11-13: qty 1

## 2015-11-13 MED ORDER — HEMOSTATIC AGENTS (NO CHARGE) OPTIME
TOPICAL | Status: DC | PRN
  Administered 2015-11-13: 1 via TOPICAL

## 2015-11-13 SURGICAL SUPPLY — 67 items
AGENT HMST SPONGE THK3/8 (HEMOSTASIS) ×1
BAG DECANTER FOR FLEXI CONT (MISCELLANEOUS) IMPLANT
BAG SPEC THK2 15X12 ZIP CLS (MISCELLANEOUS)
BAG ZIPLOCK 12X15 (MISCELLANEOUS) IMPLANT
BANDAGE ACE 4X5 VEL STRL LF (GAUZE/BANDAGES/DRESSINGS) ×3 IMPLANT
BANDAGE ACE 6X5 VEL STRL LF (GAUZE/BANDAGES/DRESSINGS) ×3 IMPLANT
BLADE SAG 18X100X1.27 (BLADE) ×3 IMPLANT
BLADE SAW SGTL 11.0X1.19X90.0M (BLADE) ×3 IMPLANT
BNDG COHESIVE 4X5 TAN NS LF (GAUZE/BANDAGES/DRESSINGS) ×6 IMPLANT
BNDG GAUZE ELAST 4 BULKY (GAUZE/BANDAGES/DRESSINGS) ×2 IMPLANT
BONE CEMENT GENTAMICIN (Cement) ×6 IMPLANT
CAP KNEE TOTAL 3 SIGMA ×2 IMPLANT
CEMENT BONE GENTAMICIN 40 (Cement) ×2 IMPLANT
CLOSURE WOUND 1/2 X4 (GAUZE/BANDAGES/DRESSINGS) ×1
CLOTH BEACON ORANGE TIMEOUT ST (SAFETY) ×3 IMPLANT
CONT SPEC 4OZ CLIKSEAL STRL BL (MISCELLANEOUS) ×2 IMPLANT
CUFF TOURN SGL QUICK 34 (TOURNIQUET CUFF) ×3
CUFF TRNQT CYL 34X4X40X1 (TOURNIQUET CUFF) ×1 IMPLANT
DECANTER SPIKE VIAL GLASS SM (MISCELLANEOUS) ×1 IMPLANT
DRAPE INCISE IOBAN 66X45 STRL (DRAPES) ×2 IMPLANT
DRAPE U-SHAPE 47X51 STRL (DRAPES) ×3 IMPLANT
DRSG ADAPTIC 3X8 NADH LF (GAUZE/BANDAGES/DRESSINGS) ×3 IMPLANT
DRSG PAD ABDOMINAL 8X10 ST (GAUZE/BANDAGES/DRESSINGS) ×6 IMPLANT
DURAPREP 26ML APPLICATOR (WOUND CARE) ×3 IMPLANT
ELECT REM PT RETURN 9FT ADLT (ELECTROSURGICAL) ×3
ELECTRODE REM PT RTRN 9FT ADLT (ELECTROSURGICAL) ×1 IMPLANT
EVACUATOR 1/8 PVC DRAIN (DRAIN) ×3 IMPLANT
GAUZE SPONGE 4X4 12PLY STRL (GAUZE/BANDAGES/DRESSINGS) ×3 IMPLANT
GLOVE BIOGEL PI IND STRL 6.5 (GLOVE) ×1 IMPLANT
GLOVE BIOGEL PI IND STRL 8 (GLOVE) ×1 IMPLANT
GLOVE BIOGEL PI INDICATOR 6.5 (GLOVE) ×2
GLOVE BIOGEL PI INDICATOR 8 (GLOVE) ×2
GLOVE ECLIPSE 8.0 STRL XLNG CF (GLOVE) ×6 IMPLANT
GLOVE SURG SS PI 6.5 STRL IVOR (GLOVE) ×3 IMPLANT
GOWN STRL REUS W/TWL LRG LVL3 (GOWN DISPOSABLE) ×3 IMPLANT
GOWN STRL REUS W/TWL XL LVL3 (GOWN DISPOSABLE) ×3 IMPLANT
HANDPIECE INTERPULSE COAX TIP (DISPOSABLE) ×3
HEMOSTAT SPONGE AVITENE ULTRA (HEMOSTASIS) ×2 IMPLANT
IMMOBILIZER KNEE 20 (SOFTGOODS) ×3
IMMOBILIZER KNEE 20 THIGH 36 (SOFTGOODS) ×1 IMPLANT
MANIFOLD NEPTUNE II (INSTRUMENTS) ×3 IMPLANT
NDL HYPO 21X1.5 SAFETY (NEEDLE) ×1 IMPLANT
NEEDLE HYPO 21X1.5 SAFETY (NEEDLE) ×3 IMPLANT
NEEDLE HYPO 22GX1.5 SAFETY (NEEDLE) ×3 IMPLANT
NS IRRIG 1000ML POUR BTL (IV SOLUTION) IMPLANT
PACK TOTAL KNEE CUSTOM (KITS) ×3 IMPLANT
PENCIL SMOKE EVAC W/HOLSTER (ELECTROSURGICAL) ×3 IMPLANT
POSITIONER SURGICAL ARM (MISCELLANEOUS) ×3 IMPLANT
SET HNDPC FAN SPRY TIP SCT (DISPOSABLE) ×1 IMPLANT
SET PAD KNEE POSITIONER (MISCELLANEOUS) ×3 IMPLANT
SPONGE LAP 18X18 X RAY DECT (DISPOSABLE) IMPLANT
SPONGE SURGIFOAM ABS GEL 100 (HEMOSTASIS) ×1 IMPLANT
STRIP CLOSURE SKIN 1/2X4 (GAUZE/BANDAGES/DRESSINGS) ×3 IMPLANT
SUT BONE WAX W31G (SUTURE) ×2 IMPLANT
SUT MNCRL AB 4-0 PS2 18 (SUTURE) ×3 IMPLANT
SUT VIC AB 1 CT1 27 (SUTURE) ×6
SUT VIC AB 1 CT1 27XBRD ANTBC (SUTURE) ×2 IMPLANT
SUT VIC AB 2-0 CT1 27 (SUTURE) ×9
SUT VIC AB 2-0 CT1 TAPERPNT 27 (SUTURE) ×3 IMPLANT
SUT VLOC 180 0 24IN GS25 (SUTURE) ×3 IMPLANT
SYR 20CC LL (SYRINGE) ×6 IMPLANT
TOWER CARTRIDGE SMART MIX (DISPOSABLE) ×3 IMPLANT
TRAY FOLEY W/METER SILVER 14FR (SET/KITS/TRAYS/PACK) ×1 IMPLANT
TRAY FOLEY W/METER SILVER 16FR (SET/KITS/TRAYS/PACK) ×3 IMPLANT
WATER STERILE IRR 1500ML POUR (IV SOLUTION) ×5 IMPLANT
WRAP KNEE MAXI GEL POST OP (GAUZE/BANDAGES/DRESSINGS) ×3 IMPLANT
YANKAUER SUCT BULB TIP 10FT TU (MISCELLANEOUS) ×3 IMPLANT

## 2015-11-13 NOTE — Anesthesia Procedure Notes (Signed)
Procedure Name: Intubation Date/Time: 11/13/2015 8:28 AM Performed by: Danley Danker L Patient Re-evaluated:Patient Re-evaluated prior to inductionOxygen Delivery Method: Circle system utilized Preoxygenation: Pre-oxygenation with 100% oxygen Intubation Type: IV induction Ventilation: Mask ventilation without difficulty and Oral airway inserted - appropriate to patient size Laryngoscope Size: Miller and 3 Grade View: Grade I Tube type: Oral Tube size: 8.0 mm Number of attempts: 1 Airway Equipment and Method: Stylet Placement Confirmation: ETT inserted through vocal cords under direct vision,  breath sounds checked- equal and bilateral and positive ETCO2 Secured at: 21 cm Tube secured with: Tape Dental Injury: Teeth and Oropharynx as per pre-operative assessment

## 2015-11-13 NOTE — Anesthesia Postprocedure Evaluation (Signed)
Anesthesia Post Note  Patient: TEDD BACHMANN  Procedure(s) Performed: Procedure(s) (LRB): TOTAL KNEE ARTHROPLASTY (Right)  Patient location during evaluation: PACU Anesthesia Type: General Level of consciousness: awake and alert Pain management: pain level controlled Vital Signs Assessment: post-procedure vital signs reviewed and stable Respiratory status: spontaneous breathing, nonlabored ventilation, respiratory function stable and patient connected to nasal cannula oxygen Cardiovascular status: blood pressure returned to baseline and stable Postop Assessment: no signs of nausea or vomiting Anesthetic complications: no    Last Vitals:  Filed Vitals:   11/13/15 1041 11/13/15 1045  BP:  150/70  Pulse: 63 62  Temp:    Resp: 17 13    Last Pain: There were no vitals filed for this visit.               Alexis Frock

## 2015-11-13 NOTE — Evaluation (Signed)
Physical Therapy Evaluation Patient Details Name: Randall Lara MRN: [id number] DOB: 07-30-1929 Today's Date: 11/13/2015   History of Present Illness  Pt s/p R TKR with hx ofA-fib  Clinical Impression  Pt s/p R TKR presents with decreased R LE strength/ROM and post op pain limiting functional mobility.  Pt should progress to dc home with family assist and HHPT follow up.    Follow Up Recommendations Home health PT    Equipment Recommendations  None recommended by PT    Recommendations for Other Services OT consult     Precautions / Restrictions Precautions Precautions: Knee;Fall Required Braces or Orthoses: Knee Immobilizer - Right Knee Immobilizer - Right: Discontinue once straight leg raise with < 10 degree lag Restrictions Weight Bearing Restrictions: No Other Position/Activity Restrictions: WBAT      Mobility  Bed Mobility Overal bed mobility: Needs Assistance Bed Mobility: Supine to Sit     Supine to sit: Min assist     General bed mobility comments: cues for sequence and use of L LE to self assist  Transfers Overall transfer level: Needs assistance Equipment used: Rolling walker (2 wheeled) Transfers: Sit to/from Stand Sit to Stand: Min assist         General transfer comment: cues for LE management, use of UEs to self assist and basic saftey awareness  Ambulation/Gait Ambulation/Gait assistance: Min assist Ambulation Distance (Feet): 48 Feet Assistive device: Rolling walker (2 wheeled) Gait Pattern/deviations: Step-to pattern;Step-through pattern;Decreased step length - right;Decreased step length - left;Shuffle;Trunk flexed Gait velocity: decr   General Gait Details: cues for posture, position from RW and sequence  Stairs            Wheelchair Mobility    Modified Rankin (Stroke Patients Only)       Balance Overall balance assessment: Needs assistance Sitting-balance support: Feet supported;No upper extremity supported Sitting  balance-Leahy Scale: Good     Standing balance support: Bilateral upper extremity supported Standing balance-Leahy Scale: Fair                               Pertinent Vitals/Pain Pain Assessment: 0-10 Pain Score: 4  Pain Location: R knee Pain Descriptors / Indicators: Aching;Sore Pain Intervention(s): Limited activity within patient's tolerance;Monitored during session;Premedicated before session;Ice applied    Home Living Family/patient expects to be discharged to:: Private residence Living Arrangements: Spouse/significant other Available Help at Discharge: Family Type of Home: House Home Access: Stairs to enter   Technical brewer of Steps: 1 Home Layout: One level Home Equipment: Environmental consultant - 2 wheels      Prior Function Level of Independence: Independent               Hand Dominance        Extremity/Trunk Assessment   Upper Extremity Assessment: Overall WFL for tasks assessed           Lower Extremity Assessment: RLE deficits/detail         Communication   Communication: No difficulties  Cognition Arousal/Alertness: Awake/alert Behavior During Therapy: WFL for tasks assessed/performed (Impulsive) Overall Cognitive Status: Within Functional Limits for tasks assessed                      General Comments      Exercises Total Joint Exercises Ankle Circles/Pumps: AROM;Both;15 reps;Supine      Assessment/Plan    PT Assessment Patient needs continued PT services  PT Diagnosis Difficulty walking  PT Problem List Decreased strength;Decreased range of motion;Decreased activity tolerance;Decreased balance;Decreased mobility;Decreased knowledge of use of DME;Pain;Decreased safety awareness  PT Treatment Interventions DME instruction;Gait training;Stair training;Functional mobility training;Therapeutic activities;Therapeutic exercise;Patient/family education   PT Goals (Current goals can be found in the Care Plan section)  Acute Rehab PT Goals Patient Stated Goal: Get back to gardening PT Goal Formulation: With patient Time For Goal Achievement: 11/18/15 Potential to Achieve Goals: Good    Frequency 7X/week   Barriers to discharge        Co-evaluation               End of Session Equipment Utilized During Treatment: Gait belt;Right knee immobilizer Activity Tolerance: Patient tolerated treatment well Patient left: in chair;with call bell/phone within reach;with chair alarm set Nurse Communication: Mobility status         Time: [id number] PT Time Calculation (min) (ACUTE ONLY): 30 min   Charges:   PT Evaluation $PT Eval Low Complexity: 1 Procedure PT Treatments $Gait Training: 8-22 mins   PT G Codes:        Randall Lara Dec 10, 2015, 5:20 PM

## 2015-11-13 NOTE — Brief Op Note (Signed)
11/13/2015  10:02 AM  PATIENT:  Randall Lara  82 y.o. male  PRE-OPERATIVE DIAGNOSIS:  RIGHT KNEE primary OA  POST-OPERATIVE DIAGNOSIS:  RIGHT KNEE Primary OA  PROCEDURE:  Procedure(s): TOTAL KNEE ARTHROPLASTY (Right)  SURGEON:  Surgeon(s) and Role:    * Latanya Maudlin, MD - Primary  PHYSICIAN ASSISTANT: Ardeen Jourdain PA  ASSISTANTS:Amber Constable  ANESTHESIA:   general  EBL:  Total I/O In: 1000 [I.V.:1000] Out: 400 [Urine:350; Blood:50]  BLOOD ADMINISTERED:none  DRAINS: none and (One) Hemovact drain(s) in the Right Knee with  Suction Open   LOCAL MEDICATIONS USED:  MARCAINE 20cc of 0.25% plain and 20cc of Exparel mixed with 20cc of Normal Saline   SPECIMEN:  No Specimen  DISPOSITION OF SPECIMEN:  N/A  COUNTS:  YES  TOURNIQUET:  * Missing tourniquet times found for documented tourniquets in log:  [id number] *  DICTATION: .Other Dictation: Dictation Number [id number]  PLAN OF CARE: Admit to inpatient   PATIENT DISPOSITION:  Stable in OR   Delay start of Pharmacological VTE agent (>24hrs) due to surgical blood loss or risk of bleeding: yes

## 2015-11-13 NOTE — Transfer of Care (Signed)
Immediate Anesthesia Transfer of Care Note  Patient: Randall Lara  Procedure(s) Performed: Procedure(s): TOTAL KNEE ARTHROPLASTY (Right)  Patient Location: PACU  Anesthesia Type:General  Level of Consciousness: awake  Airway & Oxygen Therapy: Patient Spontanous Breathing and Patient connected to face mask oxygen  Post-op Assessment: Report given to RN and Post -op Vital signs reviewed and stable  Post vital signs: Reviewed and stable  Last Vitals:  Filed Vitals:   11/13/15 0558  BP: 142/73  Pulse: 47  Temp: 36.7 C  Resp: 16    Last Pain: There were no vitals filed for this visit.       Complications: No apparent anesthesia complications

## 2015-11-13 NOTE — Progress Notes (Signed)
Utilization review completed.  

## 2015-11-13 NOTE — Op Note (Signed)
Randall Lara, Randall Lara NO.:  [id number]  MEDICAL RECORD NO.:  [id number]  LOCATION:  [facility]                         FACILITY:  [facility]  PHYSICIAN:  Kipp Brood. Amorie Rentz, M.D.DATE OF BIRTH:  [birth year]  DATE OF PROCEDURE:  11/13/2015 DATE OF DISCHARGE:                              OPERATIVE REPORT   SURGEON:  Kipp Brood. Gladstone Lighter, M.D.  OPERATIVE ASSISTANT:  Ardeen Jourdain, PA.  PREOPERATIVE DIAGNOSES: 1. Bone-on-bone primary osteoarthritis with a valgus attitude of the     right knee. 2. Large loose body, right knee.  POSTOPERATIVE DIAGNOSES: 1. Bone-on-bone primary osteoarthritis with a valgus attitude of the     right knee. 2. Large loose body, right knee.  OPERATION:  Right total knee arthroplasty utilizing DePuy system.  All three components were cemented.  Gentamicin was used in the cement.  The sizes used were as follows:  The patella was a size 41 with 3 pegs.  The femoral component was a size 5 right posterior cruciate sacrificing type.  The tibial tray was a size 5.  The insert was a size 5, 12.5-mm thickness.  DESCRIPTION OF PROCEDURE:  Under general anesthesia, routine orthopedic prep and draping of the right lower extremity was carried out.  The appropriate time-out was carried out.  I also marked the appropriate right leg in the holding area.  At this time, the right leg was exsanguinated and Esmarch tourniquet was elevated at 325 mmHg.  The knee then was flexed and anterior approach to the knee was carried out, I created two flaps.  Following that, I carried out a median parapatellar incision and reflected the patella laterally and flexed the knee.  I did medial and lateral meniscectomies and excised the anterior, posterior, cruciate ligaments.  We also removed the large loose body and back out of the posterior compartment.  At this time, we thoroughly irrigated out the knee.  Attention first was directed to the distal femur, which was initial  drill hole was made in the femur.  The guide rod was inserted. At this time, 11-mm thickness was removed from the distal femur.  The femur then was measured to be a size 5.  We did anterior, posterior, chamfering cuts for size 5 distal femoral component on the right.  Next, tibial tray was prepared, the tibial plateau was prepared in usual fashion.  Initial drill hole was made in the tibial plateau.  The guide rod was inserted.  We thoroughly irrigated out both the femur initially and the tibial canals.  We then inserted our guide rod and removed 6-mm thickness off the affected medial side.  Note, at this particular point, we then thoroughly irrigated out the area and inserted our lamina spreaders and made sure all spurs were removed where we cleaned out the soft tissue area posteriorly.  We then inserted our spacer blocks and felt that the 12.5-mm would be the best fit.  Following that, we then went on and made our tibial plateau cut in the usual fashion.  We cut our keel cut as well.  At this point, we then cut our distal femur in usual fashion for a size 5.  We  inserted trial components, reduced the knee, had the best fit with a 12.5-mm insert.  We then with the trial components in, did a resurfacing procedure on the patella for a size 41 patella.  Three drill holes were made in the articular surface of the patella.  All components were removed.  We thoroughly water picked out the knee, cemented all three components in simultaneously after we inserted some Gelfoam.  At this particular point, after the cement was hardened, we removed all loose pieces of cement.  We then water picked the knee out again and make sure all loose pieces of cement were gone. We then inserted our permanent spacer after we inserted some Gelfoam posteriorly.  We then reduced the knee with a permanent pacer rotating platform, polyethylene size 5, 12.5-mm thickness, reduced the knee and had an excellent fit.  We  then thoroughly irrigated out the knee again and then inserted a Hemovac drain, closed the knee in layers in usual fashion over Hemovac drain.  Toward the end of the case, we injected 20 mL of 0.25% plain Marcaine in the soft tissue.  At the end of the case, we injected the mixture of Exparel 20 mL mixed with 20 mL of saline.  We also gave him 1 g of tranexamic acid prior to the procedure and also at the beginning of procedure, he had 2 g of IV Ancef.          ______________________________ Kipp Brood. Gladstone Lighter, M.D.     RAG/MEDQ  D:  11/13/2015  T:  11/13/2015  Job:  [id number]

## 2015-11-13 NOTE — Interval H&P Note (Signed)
History and Physical Interval Note:  11/13/2015 7:58 AM  Randall Lara  has presented today for surgery, with the diagnosis of RIGHT KNEE OA  The various methods of treatment have been discussed with the patient and family. After consideration of risks, benefits and other options for treatment, the patient has consented to  Procedure(s): TOTAL KNEE ARTHROPLASTY (Right) as a surgical intervention .  The patient's history has been reviewed, patient examined, no change in status, stable for surgery.  I have reviewed the patient's chart and labs.  Questions were answered to the patient's satisfaction.     Jeremy Mclamb A

## 2015-11-14 LAB — BASIC METABOLIC PANEL
ANION GAP: 11 (ref 5–15)
BUN: 17 mg/dL (ref 6–20)
CHLORIDE: 102 mmol/L (ref 101–111)
CO2: 27 mmol/L (ref 22–32)
Calcium: 9.2 mg/dL (ref 8.9–10.3)
Creatinine, Ser: 1.15 mg/dL (ref 0.61–1.24)
GFR, EST NON AFRICAN AMERICAN: 57 mL/min — AB (ref >60)
Glucose, Bld: 143 mg/dL — ABNORMAL HIGH (ref 65–99)
POTASSIUM: 4.2 mmol/L (ref 3.5–5.1)
SODIUM: 140 mmol/L (ref 135–145)

## 2015-11-14 LAB — CBC
HCT: 36.8 % — ABNORMAL LOW (ref 39.0–52.0)
HEMOGLOBIN: 12.1 g/dL — AB (ref 13.0–17.0)
MCH: 31.8 pg (ref 26.0–34.0)
MCHC: 32.9 g/dL (ref 30.0–36.0)
MCV: 96.6 fL (ref 78.0–100.0)
PLATELETS: 121 10*3/uL — AB (ref 150–400)
RBC: 3.81 MIL/uL — AB (ref 4.22–5.81)
RDW: 14.4 % (ref 11.5–15.5)
WBC: 12 10*3/uL — AB (ref 4.0–10.5)

## 2015-11-14 MED ORDER — OXYCODONE-ACETAMINOPHEN 5-325 MG PO TABS: 1.0000 | ORAL_TABLET | ORAL | Status: DC | PRN

## 2015-11-14 MED ORDER — METHOCARBAMOL 500 MG PO TABS: 500.0000 mg | ORAL_TABLET | Freq: Four times a day (QID) | ORAL | Status: DC | PRN

## 2015-11-14 NOTE — Progress Notes (Signed)
CSW consulted for SNF placement. PN reviewed. PT is recommending HHPT at d/c. RNCM will assist with d/c planning.  Werner Lean LCSW [phone]

## 2015-11-14 NOTE — Progress Notes (Signed)
Physical Therapy Treatment Patient Details Name: DELFINO SEMO MRN: [id number] DOB: 08-13-1929 Today's Date: 11/14/2015    History of Present Illness Pt s/p R TKR with hx ofA-fib    PT Comments    POD # 1 pm session Applied KI and assisted with amb to and from the bathroom.  Very unsteady gait with near fall in bathyroom as pt tried to reach too far front to grab bar.  VC's on safety and proper walker use.  Assisted back to bed.   Follow Up Recommendations  Home health PT     Equipment Recommendations  None recommended by PT    Recommendations for Other Services       Precautions / Restrictions Precautions Precautions: Knee;Fall Precaution Comments: Instructed on KI use for amb pt and spouse Required Braces or Orthoses: Knee Immobilizer - Right Knee Immobilizer - Right: Discontinue once straight leg raise with < 10 degree lag Restrictions Weight Bearing Restrictions: No Other Position/Activity Restrictions: WBAT    Mobility  Bed Mobility Overal bed mobility: Needs Assistance Bed Mobility: Sit to Supine     Supine to sit: Min assist Sit to supine: Min assist   General bed mobility comments: assist R LE and increased time  Transfers Overall transfer level: Needs assistance Equipment used: Rolling walker (2 wheeled) Transfers: Sit to/from Stand Sit to Stand: Min assist         General transfer comment: 75% VC's on proper hand placement and safety.  Ambulation/Gait Ambulation/Gait assistance: Min assist Ambulation Distance (Feet): 12 Feet Assistive device: Rolling walker (2 wheeled) Gait Pattern/deviations: Step-to pattern;Decreased stance time - right;Trunk flexed Gait velocity: decr   General Gait Details: 75% VC's on proper walker to self distance and safety with turns.  Assisted to bathroom with near fall as pt tried to reach beyond walker for grab bar.     Stairs            Wheelchair Mobility    Modified Rankin (Stroke Patients Only)       Balance                                    Cognition Arousal/Alertness: Awake/alert Behavior During Therapy: WFL for tasks assessed/performed Overall Cognitive Status: Within Functional Limits for tasks assessed Area of Impairment: Safety/judgement               General Comments: pt requires repeat VC's to stay on task. Pt impulsive.    Exercises      General Comments        Pertinent Vitals/Pain Pain Assessment: 0-10 Pain Score: 8  Pain Location: R knee Pain Descriptors / Indicators: Sore Pain Intervention(s): Monitored during session;Premedicated before session;Repositioned;Ice applied    Home Living Family/patient expects to be discharged to:: Private residence Living Arrangements: Spouse/significant other Available Help at Discharge: Family           Additional Comments: Pt did not answer whether he had stall or tub shower.  3:1 in room    Prior Function Level of Independence: Independent          PT Goals (current goals can now be found in the care plan section) Acute Rehab PT Goals Patient Stated Goal: Get back to gardening Progress towards PT goals: Progressing toward goals    Frequency  7X/week    PT Plan Current plan remains appropriate    Co-evaluation  End of Session Equipment Utilized During Treatment: Gait belt;Right knee immobilizer Activity Tolerance: Patient tolerated treatment well Patient left: in chair;with call bell/phone within reach;with chair alarm set     Time: 1342-1410 PT Time Calculation (min) (ACUTE ONLY): 28 min  Charges:  $Gait Training: 8-22 mins $Therapeutic Exercise: 8-22 mins                    G Codes:      Rica Koyanagi  PTA [facility]  Acute  Rehab Pager      [phone]

## 2015-11-14 NOTE — Progress Notes (Signed)
Subjective: 1 Day Post-Op Procedure(s) (LRB): TOTAL KNEE ARTHROPLASTY (Right) Patient reports pain as 2 on 0-10 scale.    Objective: Vital signs in last 24 hours: Temp:  [97.6 F (36.4 C)-98.3 F (36.8 C)] 97.9 F (36.6 C) (05/04 0523) Pulse Rate:  [45-70] 70 (05/04 0523) Resp:  [7-17] 16 (05/04 0523) BP: (112-159)/(60-99) 144/77 mmHg (05/04 0523) SpO2:  [95 %-100 %] 100 % (05/04 0523)  Intake/Output from previous day: 05/03 0701 - 05/04 0700 In: 2605 [P.O.:840; I.V.:1600; IV Piggyback:165] Out: [id number] [Urine:1480; Drains:200; Blood:50] Intake/Output this shift:     Recent Labs  11/13/15 0635 11/14/15 0405  HGB 12.9* 12.1*    Recent Labs  11/13/15 0635 11/14/15 0405  WBC 4.6 12.0*  RBC 4.00* 3.81*  HCT 38.5* 36.8*  PLT 105* 121*    Recent Labs  11/14/15 0405  NA 140  K 4.2  CL 102  CO2 27  BUN 17  CREATININE 1.15  GLUCOSE 143*  CALCIUM 9.2    Recent Labs  11/13/15 0635  INR 1.15    Dorsiflexion/Plantar flexion intact No cellulitis present Compartment soft  Assessment/Plan: 1 Day Post-Op Procedure(s) (LRB): TOTAL KNEE ARTHROPLASTY (Right) Up with therapy  Jamine Highfill A 11/14/2015, 7:28 AM

## 2015-11-14 NOTE — Care Management Note (Signed)
Case Management Note  Patient Details  Name: Randall Lara MRN: [id number] Date of Birth: 02/09/1930  Subjective/Objective:                  TOTAL KNEE ARTHROPLASTY (Right) Action/Plan: Discharge planning Expected Discharge Date:  11/15/15               Expected Discharge Plan:  [place]  In-House Referral:     Discharge planning Services  CM Consult  Post Acute Care Choice:    Choice offered to:  Patient, Spouse  DME Arranged:  3-N-1 DME Agency:  [place]:  PT [place]:  [place]  Status of Service:  Completed, signed off  Medicare Important Message Given:    Date Medicare IM Given:    Medicare IM give by:    Date Additional Medicare IM Given:    Additional Medicare Important Message give by:     If discussed at [place] of Stay Meetings, dates discussed:    Additional Comments: CM met with pt and pt's spouse in room to offer choice of home health agency.  Pt chooses [facility] to render HHPT.  Referral accepted by [facility] rep, Tim (on unit).  CM called [facility] DME rep, Lecretia to please deliver the 3n1 to room prior to discharge. No other CM needs were communicated. Dellie Catholic, RN 11/14/2015, 2:07 PM

## 2015-11-14 NOTE — Evaluation (Signed)
Occupational Therapy Evaluation Patient Details Name: Randall Lara MRN: [id number] DOB: 21-Apr-1930 Today's Date: 11/14/2015    History of Present Illness Pt s/p R TKR with hx ofA-fib   Clinical Impression   Pt was admitted for the above surgery.  Will follow in acute setting to increase safety and independence with adls as well as for family education    Follow Up Recommendations  Supervision/Assistance - 24 hour    Equipment Recommendations  3 in 1 bedside comode    Recommendations for Other Services       Precautions / Restrictions Precautions Precautions: Knee;Fall Required Braces or Orthoses: Knee Immobilizer - Right Knee Immobilizer - Right: Discontinue once straight leg raise with < 10 degree lag Restrictions Weight Bearing Restrictions: No Other Position/Activity Restrictions: WBAT      Mobility Bed Mobility               General bed mobility comments: oob; wanted to remain sitting up  Transfers   Equipment used: Rolling walker (2 wheeled) Transfers: Sit to/from Stand Sit to Stand: Min assist         General transfer comment: steadying assistance; cues for UE/LE placement and assist to guard pt when sitting and help him extend RLE    Balance                                            ADL Overall ADL's : Needs assistance/impaired             Lower Body Bathing: Moderate assistance;Sit to/from stand       Lower Body Dressing: Maximal assistance;Sit to/from stand   Toilet Transfer: Minimal assistance;Ambulation (back to chair)             General ADL Comments: seen for assessment only:  did  not want to complete any adl tasks at time of evaluation.  Will need to clarify with wife whether shower is a stall or inside of a tub     Vision     Perception     Praxis      Pertinent Vitals/Pain Pain Score: 5  Pain Location: R knee Pain Descriptors / Indicators: Sore Pain Intervention(s): Limited activity within  patient's tolerance;Monitored during session;Repositioned (offered more ice, but pt declined)     Hand Dominance     Extremity/Trunk Assessment Upper Extremity Assessment Upper Extremity Assessment: Overall WFL for tasks assessed           Communication Communication Communication: No difficulties   Cognition Arousal/Alertness: Awake/alert Behavior During Therapy: WFL for tasks assessed/performed   Area of Impairment: Safety/judgement               General Comments: pt able to follow all commands and knew chair alarm was on.  Although he said he was leaving tomorrow, he asked me to please not put ice bags back in freezer (warm).  Cues for safety for stand to sit sat despite cues--not optimal but not unsafe   General Comments       Exercises       Shoulder Instructions      Home Living Family/patient expects to be discharged to:: Private residence Living Arrangements: Spouse/significant other Available Help at Discharge: Family  Additional Comments: Pt did not answer whether he had stall or tub shower.  3:1 in room      Prior Functioning/Environment Level of Independence: Independent             OT Diagnosis: Acute pain   OT Problem List: Decreased strength;Decreased activity tolerance;Decreased safety awareness;Pain;Decreased knowledge of use of DME or AE   OT Treatment/Interventions: Self-care/ADL training;DME and/or AE instruction;Patient/family education    OT Goals(Current goals can be found in the care plan section) Acute Rehab OT Goals Patient Stated Goal: Get back to gardening OT Goal Formulation: With patient Time For Goal Achievement: 11/21/15 Potential to Achieve Goals: Good ADL Goals Pt Will Transfer to Toilet: with min guard assist;ambulating;bedside commode Pt Will Perform Tub/Shower Transfer: Tub transfer;Shower transfer;with min assist (verbalize vs demonstrate with appropriate DME) Additional  ADL Goal #1: wife will safely assist with LB adls and bathroom transfers with supervision  OT Frequency: Min 2X/week   Barriers to D/C:            Co-evaluation              End of Session    Activity Tolerance: Patient limited by fatigue Patient left: in chair;with call bell/phone within reach;with chair alarm set   Time: [id number] OT Time Calculation (min): 16 min Charges:  OT General Charges $OT Visit: 1 Procedure OT Evaluation $OT Eval Low Complexity: 1 Procedure G-Codes:    Yash Cacciola 12-10-15, 1:46 PM   Lesle Chris, OTR/L [phone] 12/10/2015

## 2015-11-14 NOTE — Discharge Instructions (Signed)

## 2015-11-14 NOTE — Progress Notes (Signed)
Physical Therapy Treatment Patient Details Name: Randall Lara MRN: [id number] DOB: July 28, 1929 Today's Date: 11/14/2015    History of Present Illness Pt s/p R TKR with hx ofA-fib    PT Comments    POD # 1 am session Applied KI and instructed on use for amb.  Spouse in room.  Assisted OOB to amb a limited distance.  Very unsteady gait with poor safety cognition and use of walker.  Pt impulsive and required repeat VC's to stay on task.  Then to recliner to perform TE's followed by ICE.   Follow Up Recommendations  Home health PT     Equipment Recommendations  None recommended by PT    Recommendations for Other Services       Precautions / Restrictions Precautions Precautions: Knee;Fall Precaution Comments: Instructed on KI use for amb pt and spouse Required Braces or Orthoses: Knee Immobilizer - Right Knee Immobilizer - Right: Discontinue once straight leg raise with < 10 degree lag Restrictions Weight Bearing Restrictions: No Other Position/Activity Restrictions: WBAT    Mobility  Bed Mobility Overal bed mobility: Needs Assistance Bed Mobility: Supine to Sit     Supine to sit: Min assist     General bed mobility comments: assist R LE and increased time  Transfers Overall transfer level: Needs assistance Equipment used: Rolling walker (2 wheeled) Transfers: Sit to/from Stand Sit to Stand: Min assist         General transfer comment: 75% VC's on proper hand placement and safety.  Ambulation/Gait Ambulation/Gait assistance: Min assist Ambulation Distance (Feet): 25 Feet Assistive device: Rolling walker (2 wheeled) Gait Pattern/deviations: Step-to pattern;Decreased stance time - right;Trunk flexed Gait velocity: decr   General Gait Details: 75% VC's on proper walker to self distance and safety with turns   Financial trader Rankin (Stroke Patients Only)       Balance                                     Cognition Arousal/Alertness: Awake/alert Behavior During Therapy: WFL for tasks assessed/performed Overall Cognitive Status: Within Functional Limits for tasks assessed Area of Impairment: Safety/judgement               General Comments: pt requires repeat VC's to stay on task. Pt impulsive.    Exercises   Total Knee Replacement TE's 10 reps B LE ankle pumps 10 reps towel squeezes 10 reps knee presses 10 reps heel slides  10 reps SAQ's 10 reps SLR's 10 reps ABD Followed by ICE     General Comments        Pertinent Vitals/Pain Pain Assessment: 0-10 Pain Score: 8  Pain Location: R knee Pain Descriptors / Indicators: Sore Pain Intervention(s): Monitored during session;Premedicated before session;Repositioned;Ice applied    Home Living Family/patient expects to be discharged to:: Private residence Living Arrangements: Spouse/significant other Available Help at Discharge: Family           Additional Comments: Pt did not answer whether he had stall or tub shower.  3:1 in room    Prior Function Level of Independence: Independent          PT Goals (current goals can now be found in the care plan section) Acute Rehab PT Goals Patient Stated Goal: Get back to gardening Progress towards PT goals: Progressing toward goals  Frequency  7X/week    PT Plan Current plan remains appropriate    Co-evaluation             End of Session Equipment Utilized During Treatment: Gait belt;Right knee immobilizer Activity Tolerance: Patient tolerated treatment well Patient left: in chair;with call bell/phone within reach;with chair alarm set     Time: 0922-0948 PT Time Calculation (min) (ACUTE ONLY): 26 min  Charges:  $Gait Training: 8-22 mins $Therapeutic Activity: 8-22 mins                    G Codes:      Rica Koyanagi  PTA [facility]  Acute  Rehab Pager      [phone]

## 2015-11-14 NOTE — Progress Notes (Signed)
[facility]    [facility] is providing the following services: [employer]  If patient discharges after hours, please call [phone].   Linward Headland 11/14/2015, 11:11 AM

## 2015-11-15 LAB — CBC
HCT: 30.1 % — ABNORMAL LOW (ref 39.0–52.0)
HEMOGLOBIN: 10.2 g/dL — AB (ref 13.0–17.0)
MCH: 32.4 pg (ref 26.0–34.0)
MCHC: 33.9 g/dL (ref 30.0–36.0)
MCV: 95.6 fL (ref 78.0–100.0)
Platelets: 93 10*3/uL — ABNORMAL LOW (ref 150–400)
RBC: 3.15 MIL/uL — AB (ref 4.22–5.81)
RDW: 14.3 % (ref 11.5–15.5)
WBC: 10.9 10*3/uL — AB (ref 4.0–10.5)

## 2015-11-15 LAB — BASIC METABOLIC PANEL
ANION GAP: 9 (ref 5–15)
BUN: 15 mg/dL (ref 6–20)
CHLORIDE: 99 mmol/L — AB (ref 101–111)
CO2: 28 mmol/L (ref 22–32)
Calcium: 8.9 mg/dL (ref 8.9–10.3)
Creatinine, Ser: 0.82 mg/dL (ref 0.61–1.24)
GFR calc Af Amer: >60 mL/min (ref >60)
Glucose, Bld: 137 mg/dL — ABNORMAL HIGH (ref 65–99)
POTASSIUM: 3.8 mmol/L (ref 3.5–5.1)
SODIUM: 136 mmol/L (ref 135–145)

## 2015-11-15 MED ORDER — DOCUSATE SODIUM 100 MG PO CAPS: 100.0000 mg | ORAL_CAPSULE | Freq: Two times a day (BID) | ORAL | Status: DC

## 2015-11-15 MED ORDER — POLYETHYLENE GLYCOL 3350 17 G PO PACK: 17.0000 g | PACK | Freq: Every day | ORAL | Status: DC | PRN

## 2015-11-15 NOTE — Progress Notes (Signed)
Patient got up with nurse tech this morning. He was very impulsive, confused, and almost fell. It took four staff members to assist him to the chair. A fall did not occur but the patient was very unsteady and could not regain his balance even with direction. Dr. Gladstone Lighter came into the room and witnessed the situation. MD verbalized the desire to work the patient up for SNF based on patient's mental status, fall risk, ambulatory limitations, and the fact that he lives just with his elderly wife who does not feel she could adequately help him at home. PT to reevaluate for disposition.

## 2015-11-15 NOTE — Clinical Social Work Placement (Signed)
   CLINICAL SOCIAL WORK PLACEMENT  NOTE  Date:  11/15/2015  Patient Details  Name: HOSSEIN ETRIS MRN: [id number] Date of Birth: [birth year]  Clinical Social Work is seeking post-discharge placement for this patient at the [place] level of care (*CSW will initial, date and re-position this form in  chart as items are completed):  Yes   Patient/family provided with [place] Work Department's list of facilities offering this level of care within the geographic area requested by the patient (or if unable, by the patient's family).  Yes   Patient/family informed of their freedom to choose among providers that offer the needed level of care, that participate in Medicare, Medicaid or managed care program needed by the patient, have an available bed and are willing to accept the patient.  Yes   Patient/family informed of 's ownership interest in [facility] and [facility], as well as of the fact that they are under no obligation to receive care at these facilities.  PASRR submitted to EDS on 11/15/15     PASRR number received on 11/15/15     Existing PASRR number confirmed on       FL2 transmitted to all facilities in geographic area requested by pt/family on 11/15/15     FL2 transmitted to all facilities within larger geographic area on       Patient informed that his/her managed care company has contracts with or will negotiate with certain facilities, including the following:        Yes   Patient/family informed of bed offers received.  Patient [facility] bed at [facility]     Physician recommends and patient chooses bed at      Patient to be transferred to [facility] on  .  Patient to be transferred to facility by       Patient family notified on   of transfer.  Name of family member notified:        PHYSICIAN       Additional Comment:     _______________________________________________ Luretha Rued, [place] 11/15/2015, 2:08 PM

## 2015-11-15 NOTE — Progress Notes (Signed)
Subjective: 2 Days Post-Op Procedure(s) (LRB): TOTAL KNEE ARTHROPLASTY (Right) Patient reports pain as 2 on 0-10 scale. Slightly confused this morning and he requires maximum assistance to ambulate. His wife cant handle him alone at home so I will have him admitted to a SNF.  Objective: Vital signs in last 24 hours: Temp:  [98.5 F (36.9 C)-98.8 F (37.1 C)] 98.6 F (37 C) (05/05 0650) Pulse Rate:  [53-80] 80 (05/05 0650) Resp:  [15-16] 15 (05/05 0650) BP: (112-132)/(45-56) 132/45 mmHg (05/05 0650) SpO2:  [92 %-97 %] 96 % (05/05 0650)  Intake/Output from previous day: 05/04 0701 - 05/05 0700 In: 2881.7 [P.O.:840; I.V.:2041.7] Out: 500 [Urine:500] Intake/Output this shift:     Recent Labs  11/13/15 0635 11/14/15 0405 11/15/15 0344  HGB 12.9* 12.1* 10.2*    Recent Labs  11/14/15 0405 11/15/15 0344  WBC 12.0* 10.9*  RBC 3.81* 3.15*  HCT 36.8* 30.1*  PLT 121* 93*    Recent Labs  11/14/15 0405 11/15/15 0344  NA 140 136  K 4.2 3.8  CL 102 99*  CO2 27 28  BUN 17 15  CREATININE 1.15 0.82  GLUCOSE 143* 137*  CALCIUM 9.2 8.9    Recent Labs  11/13/15 0635  INR 1.15    Dorsiflexion/Plantar flexion intact  Assessment/Plan: 2 Days Post-Op Procedure(s) (LRB): TOTAL KNEE ARTHROPLASTY (Right) Up with therapy Discharge to SNF  Zaineb Nowaczyk A 11/15/2015, 7:29 AM

## 2015-11-15 NOTE — Clinical Social Work Note (Signed)
Clinical Social Work Assessment  Patient Details  Name: Randall Lara MRN: [id number] Date of Birth: August 08, 1929  Date of referral:  11/15/15               Reason for consult:  Facility Placement, Discharge Planning                Permission sought to share information with:  Chartered certified accountant granted to share information::  Yes, Verbal Permission Granted  Name::        Agency::     Relationship::     Contact Information:     Housing/Transportation Living arrangements for the past 2 months:  Single Family Home Source of Information:  Patient, Spouse Patient Interpreter Needed:  None Criminal Activity/Legal Involvement Pertinent to Current Situation/Hospitalization:  No - Comment as needed Significant Relationships:  Spouse Lives with:  Spouse Do you feel safe going back to the place where you live?  No (SNF needed.) Need for family participation in patient care:  Yes (Comment)  Care giving concerns:  Pt's care cannot be managed at home following hospital d/c.   Social Worker assessment / plan:  Pt hospitalized on 11/13/15 for pre planned right knee arthroplasty. CSW met with pt / spouse to assist with d/c planning. Pt was hoping to return home at d/c but has had some post op confusion. PT is now recommending ST Rehab placement. Pt / spouse are in agreement with this plan and have requested [facility] for placement. SNF has been contacted and clinicals sent for review. SNF is able to accept pt for ST Rehab at d/c. CSW will continue to follow to assist with d/c planning needs.  Employment status:  Retired Nurse, adult PT Recommendations:  [place] / Referral to community resources:  [place]  Patient/Family's Response to care: Pt / spouse feel SNF is needed.  Patient/Family's Understanding of and Emotional Response to Diagnosis, Current Treatment, and Prognosis:  Pt / spouse are aware of  pt's medical status. " I don't know what happened this am. I think it may be that medication. I was doing fine yesterday. " Pt / spouse are pleased that [facility] can provide ST Rehab at d/c. Emotional support provided.  Emotional Assessment Appearance:  Appears stated age Attitude/Demeanor/Rapport:  Other (cooperative) Affect (typically observed):  Anxious Orientation:  Oriented to Self, Oriented to Situation, Oriented to Place Alcohol / Substance use:  Not Applicable Psych involvement (Current and /or in the community):  No (Comment)  Discharge Needs  Concerns to be addressed:  Discharge Planning Concerns Readmission within the last 30 days:  No Current discharge risk:  None Barriers to Discharge:  No Barriers Identified   Luretha Rued, [place] 11/15/2015, 1:57 PM

## 2015-11-15 NOTE — Progress Notes (Signed)
Occupational Therapy Treatment Patient Details Name: Randall Lara MRN: [id number] DOB: 1930-06-02 Today's Date: 11/15/2015    History of present illness Pt s/p R TKR with hx ofA-fib   OT comments  PLAN CHANGED TO SNF DUE TO SAFETY CONCERNS.  PT HAS HAD NEAR FALLS WITH BOTH NURSING AND PT, AND HE NEEDED MULTIMODAL CUES FOR SAFETY DURING THIS SESSION.  HAD ONE LOB.  DO NOT BELIEVE WIFE CAN SAFELY HANDLE PT AT HOME  Follow Up Recommendations  SNF    Equipment Recommendations  3 in 1 bedside comode    Recommendations for Other Services      Precautions / Restrictions Precautions Precautions: Knee;Fall Required Braces or Orthoses: Knee Immobilizer - Right Restrictions Weight Bearing Restrictions: No Other Position/Activity Restrictions: WBAT       Mobility Bed Mobility           Sit to supine: Min assist   General bed mobility comments: extra time, multimodal cues for direction  Transfers   Equipment used: Rolling walker (2 wheeled) Transfers: Sit to/from Stand Sit to Stand: Min assist;+2 safety/equipment         General transfer comment: mod multimodal cues for directionality, hand placement.  Had one LOB requiring support to correct    Balance                                   ADL Overall ADL's : Needs assistance/impaired             Lower Body Bathing: Moderate assistance;Sit to/from stand           Toilet Transfer: Minimal assistance;+2 for safety/equipment;Stand-pivot;RW (to chair)             General ADL Comments: completed bathing from EOB.  Had +2 assistance per RN's report of near fall this am and also documentation by PT yesterday of this.  Pt is HOH and he had a hard time hearing sometimes as well as not following all commands correctly making him a safety risk. Now recommend SNF for rehab.  Saw wife in passing yesterday, and she will not be able to manage him on her own      Vision                      Perception     Praxis      Cognition   Behavior During Therapy: [facility] for tasks assessed/performed Overall Cognitive Status: Impaired/Different from baseline                  General Comments: safety.  Pt states he feels confused    Extremity/Trunk Assessment               Exercises     Shoulder Instructions       General Comments      Pertinent Vitals/ Pain       Pain Assessment: Faces Faces Pain Scale: Hurts even more Pain Location: R KNEE Pain Intervention(s): Limited activity within patient's tolerance;Monitored during session;Repositioned  Home Living                                          Prior Functioning/Environment              Frequency Min 2X/week     Progress Toward Goals  OT Goals(current goals can now be found in the care plan section)  Progress towards OT goals: Not progressing toward goals - comment (more cues today; will downgrade goals next visit if needed)  Acute Rehab OT Goals Patient Stated Goal: Get back to gardening  Plan Discharge plan needs to be updated    Co-evaluation                 End of Session     Activity Tolerance Patient tolerated treatment well   Patient Left in chair;with call bell/phone within reach;with chair alarm set   Nurse Communication          Time: [phone] OT Time Calculation (min): 16 min  Charges: OT General Charges $OT Visit: 1 Procedure OT Treatments $Therapeutic Activity: 8-22 mins  Dusti Tetro 11/15/2015, 9:21 AM  Lesle Chris, OTR/L [phone] 11/15/2015

## 2015-11-15 NOTE — Care Management Note (Addendum)
Case Management Note  Patient Details  Name: Randall Lara MRN: [id number] Date of Birth: Dec 01, 1929  Subjective/Objective:    s/p R TKR        Action/Plan: Discharge Planning:  NCM spoke to pt and wife at bedside. Decided on SNF-rehab. [facility] prefers [facility] or [facility]. CSW working on SNF placement. Pt has 3n1 bedside commode delivered by [employer]. Will have wife take home for when he returns home. Notified [facility] of plan to dc to SNF.  PCP- Dr Carolann Littler    Expected Discharge Date:  11/16/2015               Expected Discharge Plan:  [place]  In-House Referral:  Clinical Social Work  Discharge planning Services  CM Consult  Post Acute Care Choice:  NA Choice offered to:  NA  DME Arranged:  3-N-1 DME Agency:  [place]:  NA [place] Agency:  NA  Status of Service:  Completed, signed off  Medicare Important Message Given:    Date Medicare IM Given:    Medicare IM give by:    Date Additional Medicare IM Given:    Additional Medicare Important Message give by:     If discussed at [place] Meetings, dates discussed:    Additional Comments:  Erenest Rasher, RN 11/15/2015, 11:54 AM

## 2015-11-15 NOTE — NC FL2 (Signed)
[place] LEVEL OF CARE SCREENING TOOL     IDENTIFICATION  Patient Name: Randall Lara Birthdate: 07-Apr-1930 Sex: male Admission Date (Current Location): 11/13/2015  [facility] and [place] Number:  Engineer, manufacturing systems and Address:  [facility],  [place], [place]      Provider Number: [id number]  Attending Physician Name and Address:  Latanya Maudlin, MD  Relative Name and Phone Number:       Current Level of Care: Hospital Recommended Level of Care: [place] Prior Approval Number:    Date Approved/Denied:   PASRR Number: [id number]  Discharge Plan: SNF    Current Diagnoses: Patient Active Problem List   Diagnosis Date Noted  . History of total knee arthroplasty 11/13/2015  . Aortic valve disease 08/23/2014  . Osteoarthritis of both knees 08/23/2014  . History of atrial fibrillation 05/15/2014  . Prediabetes 11/14/2013  . Chest pain 06/16/2013  . Obesity (BMI 30-39.9) 05/15/2013  . Globus sensation 12/01/2012  . GERD (gastroesophageal reflux disease) 11/10/2012  . Joint pain   . Obesity   . Heart palpitations   . HYPERGLYCEMIA 05/08/2010  . Anxiety state 02/11/2009  . Essential hypertension 02/11/2009    Orientation RESPIRATION BLADDER Height & Weight     Self, Place, Situation  Normal Continent Weight: 96.163 kg (212 lb) Height:  6' (182.9 cm)  BEHAVIORAL SYMPTOMS/MOOD NEUROLOGICAL BOWEL NUTRITION STATUS  Other (Comment) (No behaviors)   Continent Diet  AMBULATORY STATUS COMMUNICATION OF NEEDS Skin   Extensive Assist Verbally Surgical wounds                       Personal Care Assistance Level of Assistance  Bathing, Feeding, Dressing Bathing Assistance: Limited assistance Feeding assistance: Independent Dressing Assistance: Limited assistance     Functional Limitations Info  Sight, Hearing, Speech Sight Info: Adequate Hearing Info: Adequate Speech Info: Adequate    SPECIAL CARE  FACTORS FREQUENCY  PT (By licensed PT), OT (By licensed OT)     PT Frequency: 5 x wk OT Frequency: 5 x wk            Contractures Contractures Info: Not present    Additional Factors Info    Code Status Info: Full Code             Current Medications (11/15/2015):  This is the current hospital active medication list Current Facility-Administered Medications  Medication Dose Route Frequency Provider Last Rate Last Dose  . acetaminophen (TYLENOL) tablet 650 mg  650 mg Oral Q6H PRN Latanya Maudlin, MD   650 mg at 11/14/15 2145   Or  . acetaminophen (TYLENOL) suppository 650 mg  650 mg Rectal Q6H PRN Latanya Maudlin, MD      . ALPRAZolam Duanne Moron) tablet 1 mg  1 mg Oral BID PRN Latanya Maudlin, MD   1 mg at 11/15/15 0251  . alum & mag hydroxide-simeth (MAALOX/MYLANTA) 200-200-20 MG/5ML suspension 30 mL  30 mL Oral Q4H PRN Latanya Maudlin, MD      . apixaban [facility]) tablet 2.5 mg  2.5 mg Oral Q12H Latanya Maudlin, MD   2.5 mg at 11/15/15 0749  . benazepril (LOTENSIN) tablet 20 mg  20 mg Oral Daily Latanya Maudlin, MD   20 mg at 11/15/15 [id number]  . bisacodyl (DULCOLAX) EC tablet 5 mg  5 mg Oral Daily PRN Latanya Maudlin, MD      . escitalopram (LEXAPRO) tablet 10 mg  10 mg Oral Daily Jori Moll  Gioffre, MD   10 mg at 11/15/15 0927  . hydrochlorothiazide (MICROZIDE) capsule 12.5 mg  12.5 mg Oral Daily Latanya Maudlin, MD   12.5 mg at 11/15/15 [id number]  . HYDROcodone-acetaminophen (NORCO/VICODIN) 5-325 MG per tablet 1-2 tablet  1-2 tablet Oral Q4H PRN Latanya Maudlin, MD   2 tablet at 11/14/15 1414  . HYDROmorphone (DILAUDID) injection 0.5 mg  0.5 mg Intravenous Q2H PRN Latanya Maudlin, MD   0.5 mg at 11/13/15 1445  . lactated ringers infusion   Intravenous Continuous Latanya Maudlin, MD   Stopped at 11/14/15 1400  . menthol-cetylpyridinium (CEPACOL) lozenge 3 mg  1 lozenge Oral PRN Latanya Maudlin, MD       Or  . phenol (CHLORASEPTIC) mouth spray 1 spray  1 spray Mouth/Throat PRN Latanya Maudlin, MD      .  methocarbamol (ROBAXIN) tablet 500 mg  500 mg Oral Q6H PRN Latanya Maudlin, MD   500 mg at 11/15/15 0251   Or  . methocarbamol (ROBAXIN) 500 mg in dextrose 5 % 50 mL IVPB  500 mg Intravenous Q6H PRN Latanya Maudlin, MD   500 mg at 11/13/15 1055  . nitroGLYCERIN (NITROSTAT) SL tablet 0.4 mg  0.4 mg Sublingual Q5 min PRN Latanya Maudlin, MD      . ondansetron [facility]) tablet 4 mg  4 mg Oral Q6H PRN Latanya Maudlin, MD       Or  . ondansetron [facility]) injection 4 mg  4 mg Intravenous Q6H PRN Latanya Maudlin, MD      . oxyCODONE-acetaminophen (PERCOCET/ROXICET) 5-325 MG per tablet 2 tablet  2 tablet Oral Q4H PRN Latanya Maudlin, MD   1 tablet at 11/14/15 [phone]  . polyethylene glycol (MIRALAX / GLYCOLAX) packet 17 g  17 g Oral Daily PRN Latanya Maudlin, MD      . sodium phosphate (FLEET) 7-19 GM/118ML enema 1 enema  1 enema Rectal Once PRN Latanya Maudlin, MD         Discharge Medications: Please see discharge summary for a list of discharge medications.  Relevant Imaging Results:  Relevant Lab Results:   Additional Information SS # [id number]  Aaleah Hirsch, Randall An, LCSW

## 2015-11-15 NOTE — Progress Notes (Signed)
Physical Therapy Treatment Patient Details Name: Randall Lara MRN: [id number] DOB: [birth year] Today's Date: 11/15/2015    History of Present Illness Pt s/p R TKR with hx ofA-fib    PT Comments    POD # 2 am session Pt not progressing as well as expected with post op confusion and unstable gait.  HIGH FALL RISK.  Will consult LPT, spouse is unable to provide current care level.  Pt will need ST Rehab at SNF prior to safely returning home.    Follow Up Recommendations  SNF     Equipment Recommendations       Recommendations for Other Services       Precautions / Restrictions Precautions Precautions: Knee;Fall Precaution Comments: Instructed on KI use for amb pt and spouse Required Braces or Orthoses: Knee Immobilizer - Right Knee Immobilizer - Right: Discontinue once straight leg raise with < 10 degree lag Restrictions Weight Bearing Restrictions: No Other Position/Activity Restrictions: WBAT    Mobility  Bed Mobility           Sit to supine: Min assist   General bed mobility comments: Pt OOB in recliner  Transfers Overall transfer level: Needs assistance Equipment used: Rolling walker (2 wheeled) Transfers: Sit to/from Stand Sit to Stand: Min assist;Mod assist         General transfer comment: 75% VC's on safety with turns, proper walker use and hand placement.  Near fall getting to recliner as pt reached too far out forward and sat before completing pivot.  Sat on edge of chair uncontrolled.  Ambulation/Gait Ambulation/Gait assistance: Min assist;Mod assist Ambulation Distance (Feet): 24 Feet Assistive device: Rolling walker (2 wheeled) Gait Pattern/deviations: Step-to pattern;Step-through pattern;Trunk flexed;Decreased stance time - right     General Gait Details: 75% VC's on proper walker to self distance and safety with turns.  Also, VC's to decrease gait speed.  HIGH FALL RISK.     Stairs            Wheelchair Mobility    Modified Rankin  (Stroke Patients Only)       Balance                                    Cognition Arousal/Alertness: Awake/alert Behavior During Therapy: Impulsive Overall Cognitive Status: Impaired/Different from baseline Area of Impairment: Safety/judgement               General Comments: HOH and impaired safety cognition    Exercises   Total Knee Replacement TE's 10 reps B LE ankle pumps 10 reps towel squeezes 10 reps knee presses 10 reps heel slides  10 reps SAQ's 10 reps SLR's 10 reps ABD Followed by ICE     General Comments        Pertinent Vitals/Pain Pain Assessment: 0-10 Faces Pain Scale: Hurts even more Pain Location: R knee Pain Descriptors / Indicators: Sore Pain Intervention(s): Monitored during session;Repositioned;Ice applied    Home Living                      Prior Function            PT Goals (current goals can now be found in the care plan section) Acute Rehab PT Goals Patient Stated Goal: Get back to gardening Progress towards PT goals: Progressing toward goals    Frequency       PT Plan Discharge plan needs to be updated  Co-evaluation             End of Session Equipment Utilized During Treatment: Gait belt;Right knee immobilizer Activity Tolerance: Patient tolerated treatment well Patient left: in chair;with call bell/phone within reach;with chair alarm set     Time: 0945-1010 PT Time Calculation (min) (ACUTE ONLY): 25 min  Charges:  $Gait Training: 8-22 mins $Therapeutic Exercise: 8-22 mins                    G Codes:      Rica Koyanagi  PTA [facility]  Acute  Rehab Pager      [phone]  Co Sign required for change in D/C plans

## 2015-11-15 NOTE — Discharge Summary (Signed)
Physician Discharge Summary   Patient ID: Randall Lara MRN: [id number] DOB/AGE: [birth year] 82 y.o.  Admit date: 11/13/2015 Discharge date: tentative 11/16/2015  Primary Diagnosis: Primary osteoarthritis right knee  Admission Diagnoses:  Past Medical History  Diagnosis Date  . Atrial fibrillation ([place])   . Joint pain   . Obesity   . Hypertension   . Anxiety   . Heart palpitations   . Dysrhythmia     atrial fib  . Depression   . GERD (gastroesophageal reflux disease)     no meds needed- diet controlled   Discharge Diagnoses:   Active Problems:   History of total knee arthroplasty  Estimated body mass index is 28.75 kg/(m^2) as calculated from the following:   Height as of this encounter: 6' (1.829 m).   Weight as of this encounter: 96.163 kg (212 lb).  Procedure:  Procedure(s) (LRB): TOTAL KNEE ARTHROPLASTY (Right)   Consults: None  HPI: Randall Lara, 82 y.o. male, has a history of pain and functional disability in the right knee due to arthritis and has failed non-surgical conservative treatments for greater than 12 weeks to includeNSAID's and/or analgesics, corticosteriod injections, flexibility and strengthening excercises, use of assistive devices and activity modification. Onset of symptoms was gradual, starting >10 years ago with gradually worsening course since that time. The patient noted prior procedures on the knee to include arthroscopy and menisectomy on the right knee(s). Patient currently rates pain in the right knee(s) at 7 out of 10 with activity. Patient has night pain, worsening of pain with activity and weight bearing, pain that interferes with activities of daily living, pain with passive range of motion, crepitus and joint swelling. Patient has evidence of periarticular osteophytes and joint space narrowing by imaging studies. There is no active infection.  Laboratory Data: Admission on 11/13/2015  Component Date Value Ref Range Status  . WBC  11/13/2015 4.6  4.0 - 10.5 K/uL Final  . RBC 11/13/2015 4.00* 4.22 - 5.81 MIL/uL Final  . Hemoglobin 11/13/2015 12.9* 13.0 - 17.0 g/dL Final  . HCT 11/13/2015 38.5* 39.0 - 52.0 % Final  . MCV 11/13/2015 96.3  78.0 - 100.0 fL Final  . MCH 11/13/2015 32.3  26.0 - 34.0 pg Final  . MCHC 11/13/2015 33.5  30.0 - 36.0 g/dL Final  . RDW 11/13/2015 14.1  11.5 - 15.5 % Final  . Platelets 11/13/2015 105* 150 - 400 K/uL Final   CONSISTENT WITH PREVIOUS RESULT  . Prothrombin Time 11/13/2015 14.4  11.6 - 15.2 seconds Final  . INR 11/13/2015 1.15  0.00 - 1.49 Final  . WBC 11/14/2015 12.0* 4.0 - 10.5 K/uL Final  . RBC 11/14/2015 3.81* 4.22 - 5.81 MIL/uL Final  . Hemoglobin 11/14/2015 12.1* 13.0 - 17.0 g/dL Final  . HCT 11/14/2015 36.8* 39.0 - 52.0 % Final  . MCV 11/14/2015 96.6  78.0 - 100.0 fL Final  . MCH 11/14/2015 31.8  26.0 - 34.0 pg Final  . MCHC 11/14/2015 32.9  30.0 - 36.0 g/dL Final  . RDW 11/14/2015 14.4  11.5 - 15.5 % Final  . Platelets 11/14/2015 121* 150 - 400 K/uL Final  . Sodium 11/14/2015 140  135 - 145 mmol/L Final  . Potassium 11/14/2015 4.2  3.5 - 5.1 mmol/L Final  . Chloride 11/14/2015 102  101 - 111 mmol/L Final  . CO2 11/14/2015 27  22 - 32 mmol/L Final  . Glucose, Bld 11/14/2015 143* 65 - 99 mg/dL Final  . BUN 11/14/2015 17  6 -  20 mg/dL Final  . Creatinine, Ser 11/14/2015 1.15  0.61 - 1.24 mg/dL Final  . Calcium 11/14/2015 9.2  8.9 - 10.3 mg/dL Final  . GFR calc non Af Amer 11/14/2015 57* >60 mL/min Final  . GFR calc Af Amer 11/14/2015 >60  >60 mL/min Final   Comment: (NOTE) The eGFR has been calculated using the CKD EPI equation. This calculation has not been validated in all clinical situations. eGFR's persistently <60 mL/min signify possible Chronic Kidney Disease.   . Anion gap 11/14/2015 11  5 - 15 Final  . WBC 11/15/2015 10.9* 4.0 - 10.5 K/uL Final  . RBC 11/15/2015 3.15* 4.22 - 5.81 MIL/uL Final  . Hemoglobin 11/15/2015 10.2* 13.0 - 17.0 g/dL Final  . HCT  11/15/2015 30.1* 39.0 - 52.0 % Final  . MCV 11/15/2015 95.6  78.0 - 100.0 fL Final  . MCH 11/15/2015 32.4  26.0 - 34.0 pg Final  . MCHC 11/15/2015 33.9  30.0 - 36.0 g/dL Final  . RDW 11/15/2015 14.3  11.5 - 15.5 % Final  . Platelets 11/15/2015 93* 150 - 400 K/uL Final   CONSISTENT WITH PREVIOUS RESULT  . Sodium 11/15/2015 136  135 - 145 mmol/L Final  . Potassium 11/15/2015 3.8  3.5 - 5.1 mmol/L Final  . Chloride 11/15/2015 99* 101 - 111 mmol/L Final  . CO2 11/15/2015 28  22 - 32 mmol/L Final  . Glucose, Bld 11/15/2015 137* 65 - 99 mg/dL Final  . BUN 11/15/2015 15  6 - 20 mg/dL Final  . Creatinine, Ser 11/15/2015 0.82  0.61 - 1.24 mg/dL Final  . Calcium 11/15/2015 8.9  8.9 - 10.3 mg/dL Final  . GFR calc non Af Amer 11/15/2015 >60  >60 mL/min Final  . GFR calc Af Amer 11/15/2015 >60  >60 mL/min Final   Comment: (NOTE) The eGFR has been calculated using the CKD EPI equation. This calculation has not been validated in all clinical situations. eGFR's persistently <60 mL/min signify possible Chronic Kidney Disease.   Georgiann Hahn gap 11/15/2015 9  5 - 15 Final  Hospital Outpatient Visit on 11/05/2015  Component Date Value Ref Range Status  . aPTT 11/05/2015 36  24 - 37 seconds Final  . WBC 11/05/2015 4.7  4.0 - 10.5 K/uL Final  . RBC 11/05/2015 4.25  4.22 - 5.81 MIL/uL Final  . Hemoglobin 11/05/2015 13.4  13.0 - 17.0 g/dL Final  . HCT 11/05/2015 40.5  39.0 - 52.0 % Final  . MCV 11/05/2015 95.3  78.0 - 100.0 fL Final  . MCH 11/05/2015 31.5  26.0 - 34.0 pg Final  . MCHC 11/05/2015 33.1  30.0 - 36.0 g/dL Final  . RDW 11/05/2015 14.3  11.5 - 15.5 % Final  . Platelets 11/05/2015 112* 150 - 400 K/uL Final   Comment: SPECIMEN CHECKED FOR CLOTS REPEATED TO VERIFY PLATELET COUNT CONFIRMED BY SMEAR   . Neutrophils Relative % 11/05/2015 45   Final  . Neutro Abs 11/05/2015 2.1  1.7 - 7.7 K/uL Final  . Lymphocytes Relative 11/05/2015 40   Final  . Lymphs Abs 11/05/2015 1.9  0.7 - 4.0 K/uL Final    . Monocytes Relative 11/05/2015 12   Final  . Monocytes Absolute 11/05/2015 0.6  0.1 - 1.0 K/uL Final  . Eosinophils Relative 11/05/2015 3   Final  . Eosinophils Absolute 11/05/2015 0.2  0.0 - 0.7 K/uL Final  . Basophils Relative 11/05/2015 1   Final  . Basophils Absolute 11/05/2015 0.0  0.0 - 0.1 K/uL Final  .  Sodium 11/05/2015 143  135 - 145 mmol/L Final  . Potassium 11/05/2015 5.1  3.5 - 5.1 mmol/L Final  . Chloride 11/05/2015 106  101 - 111 mmol/L Final  . CO2 11/05/2015 27  22 - 32 mmol/L Final  . Glucose, Bld 11/05/2015 109* 65 - 99 mg/dL Final  . BUN 11/05/2015 18  6 - 20 mg/dL Final  . Creatinine, Ser 11/05/2015 0.91  0.61 - 1.24 mg/dL Final  . Calcium 11/05/2015 9.5  8.9 - 10.3 mg/dL Final  . Total Protein 11/05/2015 6.7  6.5 - 8.1 g/dL Final  . Albumin 11/05/2015 3.9  3.5 - 5.0 g/dL Final  . AST 11/05/2015 34  15 - 41 U/L Final  . ALT 11/05/2015 21  17 - 63 U/L Final  . Alkaline Phosphatase 11/05/2015 71  38 - 126 U/L Final  . Total Bilirubin 11/05/2015 0.6  0.3 - 1.2 mg/dL Final  . GFR calc non Af Amer 11/05/2015 >60  >60 mL/min Final  . GFR calc Af Amer 11/05/2015 >60  >60 mL/min Final   Comment: (NOTE) The eGFR has been calculated using the CKD EPI equation. This calculation has not been validated in all clinical situations. eGFR's persistently <60 mL/min signify possible Chronic Kidney Disease.   . Anion gap 11/05/2015 10  5 - 15 Final  . Prothrombin Time 11/05/2015 16.2* 11.6 - 15.2 seconds Final  . INR 11/05/2015 1.29  0.00 - 1.49 Final  . ABO/RH(D) 11/05/2015 O POS   Final  . Antibody Screen 11/05/2015 NEG   Final  . Sample Expiration 11/05/2015 11/16/2015   Final  . Extend sample reason 11/05/2015 NO TRANSFUSIONS OR PREGNANCY IN THE PAST 3 MONTHS   Final  . Color, Urine 11/05/2015 YELLOW  YELLOW Final  . APPearance 11/05/2015 CLOUDY* CLEAR Final  . Specific Gravity, Urine 11/05/2015 1.021  1.005 - 1.030 Final  . pH 11/05/2015 5.0  5.0 - 8.0 Final  . Glucose,  UA 11/05/2015 NEGATIVE  NEGATIVE mg/dL Final  . Hgb urine dipstick 11/05/2015 NEGATIVE  NEGATIVE Final  . Bilirubin Urine 11/05/2015 NEGATIVE  NEGATIVE Final  . Ketones, ur 11/05/2015 NEGATIVE  NEGATIVE mg/dL Final  . Protein, ur 11/05/2015 NEGATIVE  NEGATIVE mg/dL Final  . Nitrite 11/05/2015 NEGATIVE  NEGATIVE Final  . Leukocytes, UA 11/05/2015 NEGATIVE  NEGATIVE Final   MICROSCOPIC NOT DONE ON URINES WITH NEGATIVE PROTEIN, BLOOD, LEUKOCYTES, NITRITE, OR GLUCOSE <1000 mg/dL.  Marland Kitchen MRSA, PCR 11/05/2015 NEGATIVE  NEGATIVE Final  . Staphylococcus aureus 11/05/2015 NEGATIVE  NEGATIVE Final   Comment:        The Xpert SA Assay (FDA approved for NASAL specimens in patients over 82 years of age), is one component of a comprehensive surveillance program.  Test performance has been validated by [facility] for patients greater than or equal to 24 year old. It is not intended to diagnose infection nor to guide or monitor treatment.   . ABO/RH(D) 11/05/2015 O POS   Final     X-Rays:Dg Chest 2 View  11/05/2015  CLINICAL DATA:  Preoperative examination prior total knee joint replacement. EXAM: CHEST  2 VIEW COMPARISON:  Chest x-ray of June 20, 2010 FINDINGS: The lungs are hyperinflated but clear. The heart is top-normal in size. The pulmonary vascularity is not engorged. The mediastinum is normal in width. There is no pleural effusion. The bony thorax is unremarkable. IMPRESSION: COPD-reactive airway disease. There is no evidence of pneumonia, CHF, nor other acute cardiopulmonary abnormality. Electronically Signed   By: Shanon Brow  [place] M.D.   On: 11/05/2015 09:37   Mr Knee Right Wo Contrast  10/20/2015  CLINICAL DATA:  Pain and swelling in the right knee since falling 8 months ago. EXAM: MRI OF THE RIGHT KNEE WITHOUT CONTRAST TECHNIQUE: Multiplanar, multisequence MR imaging of the knee was performed. No intravenous contrast was administered. COMPARISON:  None. FINDINGS: MENISCI Medial meniscus: Small  oblique tear of the posterior horn of the medial meniscus extending to the inferior articular surface. Lateral meniscus: Complex tear of the body, anterior horn and posterior horn of the lateral meniscus. LIGAMENTS Cruciates:  Intact ACL and PCL. Collaterals: Medial collateral ligament is intact. Lateral collateral ligament complex is intact. CARTILAGE Patellofemoral:  Chondromalacia of the medial patellar facet. Medial: Partial thickness cartilage loss of the medial femorotibial compartment. Lateral: Full-thickness cartilage loss of the lateral femoral condyle and lateral tibial plateau with subchondral reactive marrow changes. Joint: Large joint effusion. Edema in Hoffa's fat. No plical thickening. Multiple loose bodies in the posterior joint space with the largest measuring 2.3 cm. Popliteal Fossa:  Small Baker cyst.  Intact popliteus tendon. Extensor Mechanism:  Intact. Bones: No other marrow signal abnormality. No fracture or dislocation. Soft tissue: Mild fatty atrophy of the medial gastrocnemius muscle. IMPRESSION: 1. Small oblique tear of the posterior horn of the medial meniscus extending to the inferior articular surface. 2. Complex tear of the body, anterior horn and posterior horn of the lateral meniscus. 3. Tricompartmental cartilage abnormalities as described above. 4. Large joint effusion.  Multiple loose bodies. 5. Small Baker cyst. Electronically Signed   By: Kathreen Devoid   On: 10/20/2015 14:56    EKG: Orders placed or performed in visit on 09/09/15  . EKG 12-Lead     Hospital Course: Randall Lara is a 82 y.o. who was admitted to [facility]. They were brought to the operating room on 11/13/2015 and underwent Procedure(s): TOTAL KNEE ARTHROPLASTY.  Patient tolerated the procedure well and was later transferred to the recovery room and then to the orthopaedic floor for postoperative care.  They were given PO and IV analgesics for pain control following their surgery.  They were given  24 hours of postoperative antibiotics of  Anti-infectives    Start     Dose/Rate Route Frequency Ordered Stop   11/13/15 1400  ceFAZolin (ANCEF) IVPB 1 g/50 mL premix     1 g 100 mL/hr over 30 Minutes Intravenous Every 6 hours 11/13/15 1137 11/13/15 2010   11/13/15 0558  ceFAZolin (ANCEF) IVPB 2g/100 mL premix     2 g 200 mL/hr over 30 Minutes Intravenous On call to O.R. 11/13/15 2992 11/13/15 0830     and started on DVT prophylaxis in the form of Eliquis.   PT and OT were ordered for total joint protocol.  Discharge planning consulted to help with postop disposition and equipment needs.  Patient had a fair night on the evening of surgery.  They started to get up OOB with therapy on day one. Hemovac drain was pulled without difficulty. Dressing was changed and the incision was clean and dry. Unfortunately he had some confusion post op day two, causing him to require multiple assist. PT was continued but patient disposition changed to SNF.   Diet: Cardiac diet Activity:WBAT Follow-up:in 2 weeks Disposition - Skilled nursing facility Discharged Condition: stable   Discharge Instructions    Call MD / Call 911    Complete by:  As directed   If you experience chest pain or shortness of  breath, CALL 911 and be transported to the hospital emergency room.  If you develope a fever above 101 F, pus (white drainage) or increased drainage or redness at the wound, or calf pain, call your surgeon's office.     Constipation Prevention    Complete by:  As directed   Drink plenty of fluids.  Prune juice may be helpful.  You may use a stool softener, such as Colace (over the counter) 100 mg twice a day.  Use MiraLax (over the counter) for constipation as needed.     Diet - low sodium heart healthy    Complete by:  As directed      Discharge instructions    Complete by:  As directed   INSTRUCTIONS AFTER JOINT REPLACEMENT   Remove items at home which could result in a fall. This includes throw rugs or  furniture in walking pathways ICE to the affected joint every three hours while awake for 30 minutes at a time, for at least the first 3-5 days, and then as needed for pain and swelling.  Continue to use ice for pain and swelling. You may notice swelling that will progress down to the foot and ankle.  This is normal after surgery.  Elevate your leg when you are not up walking on it.   Continue to use the breathing machine you got in the hospital (incentive spirometer) which will help keep your temperature down.  It is common for your temperature to cycle up and down following surgery, especially at night when you are not up moving around and exerting yourself.  The breathing machine keeps your lungs expanded and your temperature down.   DIET:  As you were doing prior to hospitalization, we recommend a well-balanced diet.  DRESSING / WOUND CARE / SHOWERING  You may change your dressing every day with sterile gauze.  Please use good hand washing techniques before changing the dressing.  Do not use any lotions or creams on the incision until instructed by your surgeon.  ACTIVITY  Increase activity slowly as tolerated, but follow the weight bearing instructions below.   No driving for 6 weeks or until further direction given by your physician.  You cannot drive while taking narcotics.  No lifting or carrying greater than 10 lbs. until further directed by your surgeon. Avoid periods of inactivity such as sitting longer than an hour when not asleep. This helps prevent blood clots.  You may return to work once you are authorized by your doctor.     WEIGHT BEARING   Weight bearing as tolerated with assist device (walker, cane, etc) as directed, use it as long as suggested by your surgeon or therapist, typically at least 4-6 weeks.   EXERCISES  Results after joint replacement surgery are often greatly improved when you follow the exercise, range of motion and muscle strengthening exercises  prescribed by your doctor. Safety measures are also important to protect the joint from further injury. Any time any of these exercises cause you to have increased pain or swelling, decrease what you are doing until you are comfortable again and then slowly increase them. If you have problems or questions, call your caregiver or physical therapist for advice.   Rehabilitation is important following a joint replacement. After just a few days of immobilization, the muscles of the leg can become weakened and shrink (atrophy).  These exercises are designed to build up the tone and strength of the thigh and leg muscles and to improve motion.  Often times heat used for twenty to thirty minutes before working out will loosen up your tissues and help with improving the range of motion but do not use heat for the first two weeks following surgery (sometimes heat can increase post-operative swelling).   These exercises can be done on a training (exercise) mat, on the floor, on a table or on a bed. Use whatever works the best and is most comfortable for you.    Use music or television while you are exercising so that the exercises are a pleasant break in your day. This will make your life better with the exercises acting as a break in your routine that you can look forward to.   Perform all exercises about fifteen times, three times per day or as directed.  You should exercise both the operative leg and the other leg as well.  Exercises include:   Quad Sets - Tighten up the muscle on the front of the thigh (Quad) and hold for 5-10 seconds.   Straight Leg Raises - With your knee straight (if you were given a brace, keep it on), lift the leg to 60 degrees, hold for 3 seconds, and slowly lower the leg.  Perform this exercise against resistance later as your leg gets stronger.  Leg Slides: Lying on your back, slowly slide your foot toward your buttocks, bending your knee up off the floor (only go as far as is  comfortable). Then slowly slide your foot back down until your leg is flat on the floor again.  Angel Wings: Lying on your back spread your legs to the side as far apart as you can without causing discomfort.  Hamstring Strength:  Lying on your back, push your heel against the floor with your leg straight by tightening up the muscles of your buttocks.  Repeat, but this time bend your knee to a comfortable angle, and push your heel against the floor.  You may put a pillow under the heel to make it more comfortable if necessary.   A rehabilitation program following joint replacement surgery can speed recovery and prevent re-injury in the future due to weakened muscles. Contact your doctor or a physical therapist for more information on knee rehabilitation.    CONSTIPATION  Constipation is defined medically as fewer than three stools per week and severe constipation as less than one stool per week.  Even if you have a regular bowel pattern at home, your normal regimen is likely to be disrupted due to multiple reasons following surgery.  Combination of anesthesia, postoperative narcotics, change in appetite and fluid intake all can affect your bowels.   YOU MUST use at least one of the following options; they are listed in order of increasing strength to get the job done.  They are all available over the counter, and you may need to use some, POSSIBLY even all of these options:    Drink plenty of fluids (prune juice may be helpful) and high fiber foods Colace 100 mg by mouth twice a day  Senokot for constipation as directed and as needed Dulcolax (bisacodyl), take with full glass of water  Miralax (polyethylene glycol) once or twice a day as needed.  If you have tried all these things and are unable to have a bowel movement in the first 3-4 days after surgery call either your surgeon or your primary doctor.    If you experience loose stools or diarrhea, hold the medications until you stool forms back  up.  If your symptoms do not get better within 1 week or if they get worse, check with your doctor.  If you experience "the worst abdominal pain ever" or develop nausea or vomiting, please contact the office immediately for further recommendations for treatment.   ITCHING:  If you experience itching with your medications, try taking only a single pain pill, or even half a pain pill at a time.  You can also use Benadryl over the counter for itching or also to help with sleep.   TED HOSE STOCKINGS:  Use stockings on both legs until for at least 2 weeks or as directed by physician office. They may be removed at night for sleeping.  MEDICATIONS:  See your medication summary on the "After Visit Summary" that nursing will review with you.  You may have some home medications which will be placed on hold until you complete the course of blood thinner medication.  It is important for you to complete the blood thinner medication as prescribed.  PRECAUTIONS:  If you experience chest pain or shortness of breath - call 911 immediately for transfer to the hospital emergency department.   If you develop a fever greater that 101 F, purulent drainage from wound, increased redness or drainage from wound, foul odor from the wound/dressing, or calf pain - CONTACT YOUR SURGEON.                                                   FOLLOW-UP APPOINTMENTS:  If you do not already have a post-op appointment, please call the office for an appointment to be seen by your surgeon.  Guidelines for how soon to be seen are listed in your "After Visit Summary", but are typically between 1-4 weeks after surgery.   MAKE SURE YOU:  Understand these instructions.  Get help right away if you are not doing well or get worse.    Thank you for letting us be a part of your medical care team.  It is a privilege we respect greatly.  We hope these instructions will help you stay on track for a fast and full recovery!     Increase activity  slowly as tolerated    Complete by:  As directed             Medication List    TAKE these medications        ALPRAZolam 1 MG tablet  Commonly known as:  XANAX  TAKE 1 TABLET BY MOUTH TWICE DAILY AS NEEDED     benazepril-hydrochlorthiazide 20-12.5 MG tablet  Commonly known as:  LOTENSIN HCT  Take 0.5 tablets by mouth daily.     docusate sodium 100 MG capsule  Commonly known as:  COLACE  Take 1 capsule (100 mg total) by mouth 2 (two) times daily.     ELIQUIS 5 MG Tabs tablet  Generic drug:  apixaban  TAKE 1 TABLET BY MOUTH TWICE DAILY     escitalopram 10 MG tablet  Commonly known as:  LEXAPRO  Take 1 tablet (10 mg total) by mouth daily.     methocarbamol 500 MG tablet  Commonly known as:  ROBAXIN  Take 1 tablet (500 mg total) by mouth every 6 (six) hours as needed for muscle spasms.     nitroGLYCERIN 0.4 MG SL tablet  Commonly known as:  NITROSTAT  Place  1 tablet (0.4 mg total) under the tongue every 5 (five) minutes as needed for chest pain.     oxyCODONE-acetaminophen 5-325 MG tablet  Commonly known as:  PERCOCET/ROXICET  Take 1-2 tablets by mouth every 4 (four) hours as needed for moderate pain.     polyethylene glycol packet  Commonly known as:  MIRALAX / GLYCOLAX  Take 17 g by mouth daily as needed for mild constipation.           Follow-up Information    Follow up with GIOFFRE,RONALD A, MD. Schedule an appointment as soon as possible for a visit in 2 weeks.   Specialty:  Orthopedic Surgery   Contact information:   [address] [place] [address] [phone]       Follow up with [facility].   Why:  home health physical therapy   Contact information:   [place] [address] [phone]       Follow up with [facility].   Why:  3n1 (bedside commode)   Contact information:   [address] [place] [address] [phone]       Signed: Ardeen Jourdain,  PA-C Orthopaedic Surgery 11/15/2015, 8:31 AM

## 2015-11-15 NOTE — Progress Notes (Signed)
Physical Therapy Treatment Patient Details Name: Randall Lara MRN: [id number] DOB: June 11, 1930 Today's Date: 11/15/2015    History of Present Illness Pt s/p R TKR with hx ofA-fib    PT Comments    POD # 2 Pt present with post op confusion and gait instability with impaired safety cognition with HIGH tendency FALL RISK.   Applied KI and assisted OOB to amb a limited distance due to fatigue then assisted back to bed. LPT agrees, pt will need ST Rehab at SNF prior to returning home with spouse.   Follow Up Recommendations  SNF     Equipment Recommendations       Recommendations for Other Services       Precautions / Restrictions      Mobility  Bed Mobility Overal bed mobility: Needs Assistance Bed Mobility: Supine to Sit;Sit to Supine     Supine to sit: Min assist Sit to supine: Mod assist   General bed mobility comments: increased assist to get back into bed.    Transfers Overall transfer level: Needs assistance Equipment used: Rolling walker (2 wheeled) Transfers: Sit to/from Stand Sit to Stand: Min assist;Mod assist         General transfer comment: still requires much assist and VC's.  HIGH FALL RISK.  Near fall onto bed as pt sat before completing turn.    Ambulation/Gait Ambulation/Gait assistance: Min assist;Mod assist Ambulation Distance (Feet): 22 Feet Assistive device: Rolling walker (2 wheeled) Gait Pattern/deviations: Step-to pattern;Decreased step length - right     General Gait Details: 75% VC's on proper walker to self distance and safety with turns.  Also, VC's to decrease gait speed.  HIGH FALL RISK.     Stairs            Wheelchair Mobility    Modified Rankin (Stroke Patients Only)       Balance                                    Cognition                            Exercises      General Comments        Pertinent Vitals/Pain      Home Living                      Prior Function             PT Goals (current goals can now be found in the care plan section) Progress towards PT goals: Progressing toward goals    Frequency       PT Plan Discharge plan needs to be updated    Co-evaluation             End of Session Equipment Utilized During Treatment: Gait belt;Right knee immobilizer Activity Tolerance: Patient tolerated treatment well Patient left: in bed;with call bell/phone within reach;with family/visitor present     Time: [id number] PT Time Calculation (min) (ACUTE ONLY): 14 min  Charges:  $Gait Training: 8-22 mins                    G Codes:      Rica Koyanagi  PTA [facility]  Acute  Rehab Pager      [phone]

## 2015-11-16 DIAGNOSIS — I358 Other nonrheumatic aortic valve disorders: Secondary | ICD-10-CM | POA: Diagnosis not present

## 2015-11-16 DIAGNOSIS — K5900 Constipation, unspecified: Secondary | ICD-10-CM | POA: Diagnosis not present

## 2015-11-16 DIAGNOSIS — R079 Chest pain, unspecified: Secondary | ICD-10-CM | POA: Diagnosis not present

## 2015-11-16 DIAGNOSIS — Z96651 Presence of right artificial knee joint: Secondary | ICD-10-CM | POA: Diagnosis not present

## 2015-11-16 DIAGNOSIS — M170 Bilateral primary osteoarthritis of knee: Secondary | ICD-10-CM | POA: Diagnosis not present

## 2015-11-16 DIAGNOSIS — R2689 Other abnormalities of gait and mobility: Secondary | ICD-10-CM | POA: Diagnosis not present

## 2015-11-16 DIAGNOSIS — I4891 Unspecified atrial fibrillation: Secondary | ICD-10-CM | POA: Diagnosis not present

## 2015-11-16 DIAGNOSIS — I10 Essential (primary) hypertension: Secondary | ICD-10-CM | POA: Diagnosis not present

## 2015-11-16 DIAGNOSIS — L0390 Cellulitis, unspecified: Secondary | ICD-10-CM | POA: Diagnosis not present

## 2015-11-16 DIAGNOSIS — Z96641 Presence of right artificial hip joint: Secondary | ICD-10-CM | POA: Diagnosis not present

## 2015-11-16 DIAGNOSIS — I482 Chronic atrial fibrillation: Secondary | ICD-10-CM | POA: Diagnosis not present

## 2015-11-16 DIAGNOSIS — K219 Gastro-esophageal reflux disease without esophagitis: Secondary | ICD-10-CM | POA: Diagnosis not present

## 2015-11-16 DIAGNOSIS — Z7901 Long term (current) use of anticoagulants: Secondary | ICD-10-CM | POA: Diagnosis not present

## 2015-11-16 DIAGNOSIS — M14861 Arthropathies in other specified diseases classified elsewhere, right knee: Secondary | ICD-10-CM | POA: Diagnosis not present

## 2015-11-16 DIAGNOSIS — M6281 Muscle weakness (generalized): Secondary | ICD-10-CM | POA: Diagnosis not present

## 2015-11-16 DIAGNOSIS — Z471 Aftercare following joint replacement surgery: Secondary | ICD-10-CM | POA: Diagnosis not present

## 2015-11-16 LAB — CBC
HEMATOCRIT: 30.3 % — AB (ref 39.0–52.0)
Hemoglobin: 10.3 g/dL — ABNORMAL LOW (ref 13.0–17.0)
MCH: 32.1 pg (ref 26.0–34.0)
MCHC: 34 g/dL (ref 30.0–36.0)
MCV: 94.4 fL (ref 78.0–100.0)
PLATELETS: 115 10*3/uL — AB (ref 150–400)
RBC: 3.21 MIL/uL — AB (ref 4.22–5.81)
RDW: 13.8 % (ref 11.5–15.5)
WBC: 11.3 10*3/uL — AB (ref 4.0–10.5)

## 2015-11-16 NOTE — Progress Notes (Signed)
   Subjective: 3 Days Post-Op Procedure(s) (LRB): TOTAL KNEE ARTHROPLASTY (Right)  Pt states he his feeling much better this morning Plan for SNF placement today Denies any new symptoms or issues Patient reports pain as mild.  Objective:   VITALS:   Filed Vitals:   11/15/15 2110 11/16/15 0410  BP: 102/61 125/61  Pulse: 80 68  Temp: 98.3 F (36.8 C) 98.6 F (37 C)  Resp: 20 20    Right knee incision healing well nv intact distally No rashes or edema  LABS  Recent Labs  11/14/15 0405 11/15/15 0344 11/16/15 0407  HGB 12.1* 10.2* 10.3*  HCT 36.8* 30.1* 30.3*  WBC 12.0* 10.9* 11.3*  PLT 121* 93* 115*     Recent Labs  11/14/15 0405 11/15/15 0344  NA 140 136  K 4.2 3.8  BUN 17 15  CREATININE 1.15 0.82  GLUCOSE 143* 137*     Assessment/Plan: 3 Days Post-Op Procedure(s) (LRB): TOTAL KNEE ARTHROPLASTY (Right) D/c to SNF today F/u in 2 weeks Weight bearing as tolerated    Merla Riches, MPAS, PA-C  11/16/2015, 7:24 AM

## 2015-11-16 NOTE — Discharge Summary (Signed)
Physician Discharge Summary   Patient ID: Randall Lara MRN: [id number] DOB/AGE: 80-Dec-1931 80 y.o.  Admit date: 11/13/2015 Discharge date: 11/16/2015  Admission Diagnoses:  Active Problems:   History of total knee arthroplasty   Discharge Diagnoses:  Same   Surgeries: Procedure(s): TOTAL KNEE ARTHROPLASTY on 11/13/2015   Consultants: PT/OT  Discharged Condition: Stable  Hospital Course: Randall Lara is an 82 y.o. male who was admitted 11/13/2015 with a chief complaint of No chief complaint on file. , and found to have a diagnosis of <principal problem not specified>.  They were brought to the operating room on 11/13/2015 and underwent the above named procedures.    The patient had an uncomplicated hospital course and was stable for discharge.  Recent vital signs:  Filed Vitals:   11/15/15 2110 11/16/15 0410  BP: 102/61 125/61  Pulse: 80 68  Temp: 98.3 F (36.8 C) 98.6 F (37 C)  Resp: 20 20    Recent laboratory studies:  Results for orders placed or performed during the hospital encounter of 11/13/15  CBC  Result Value Ref Range   WBC 4.6 4.0 - 10.5 K/uL   RBC 4.00 (L) 4.22 - 5.81 MIL/uL   Hemoglobin 12.9 (L) 13.0 - 17.0 g/dL   HCT 38.5 (L) 39.0 - 52.0 %   MCV 96.3 78.0 - 100.0 fL   MCH 32.3 26.0 - 34.0 pg   MCHC 33.5 30.0 - 36.0 g/dL   RDW 14.1 11.5 - 15.5 %   Platelets 105 (L) 150 - 400 K/uL  Protime-INR  Result Value Ref Range   Prothrombin Time 14.4 11.6 - 15.2 seconds   INR 1.15 0.00 - 1.49  CBC  Result Value Ref Range   WBC 12.0 (H) 4.0 - 10.5 K/uL   RBC 3.81 (L) 4.22 - 5.81 MIL/uL   Hemoglobin 12.1 (L) 13.0 - 17.0 g/dL   HCT 36.8 (L) 39.0 - 52.0 %   MCV 96.6 78.0 - 100.0 fL   MCH 31.8 26.0 - 34.0 pg   MCHC 32.9 30.0 - 36.0 g/dL   RDW 14.4 11.5 - 15.5 %   Platelets 121 (L) 150 - 400 K/uL  Basic metabolic panel  Result Value Ref Range   Sodium 140 135 - 145 mmol/L   Potassium 4.2 3.5 - 5.1 mmol/L   Chloride 102 101 - 111 mmol/L   CO2 27 22 - 32  mmol/L   Glucose, Bld 143 (H) 65 - 99 mg/dL   BUN 17 6 - 20 mg/dL   Creatinine, Ser 1.15 0.61 - 1.24 mg/dL   Calcium 9.2 8.9 - 10.3 mg/dL   GFR calc non Af Amer 57 (L) >60 mL/min   GFR calc Af Amer >60 >60 mL/min   Anion gap 11 5 - 15  CBC  Result Value Ref Range   WBC 10.9 (H) 4.0 - 10.5 K/uL   RBC 3.15 (L) 4.22 - 5.81 MIL/uL   Hemoglobin 10.2 (L) 13.0 - 17.0 g/dL   HCT 30.1 (L) 39.0 - 52.0 %   MCV 95.6 78.0 - 100.0 fL   MCH 32.4 26.0 - 34.0 pg   MCHC 33.9 30.0 - 36.0 g/dL   RDW 14.3 11.5 - 15.5 %   Platelets 93 (L) 150 - 400 K/uL  Basic metabolic panel  Result Value Ref Range   Sodium 136 135 - 145 mmol/L   Potassium 3.8 3.5 - 5.1 mmol/L   Chloride 99 (L) 101 - 111 mmol/L   CO2 28 22 -  32 mmol/L   Glucose, Bld 137 (H) 65 - 99 mg/dL   BUN 15 6 - 20 mg/dL   Creatinine, Ser 0.82 0.61 - 1.24 mg/dL   Calcium 8.9 8.9 - 10.3 mg/dL   GFR calc non Af Amer >60 >60 mL/min   GFR calc Af Amer >60 >60 mL/min   Anion gap 9 5 - 15  CBC  Result Value Ref Range   WBC 11.3 (H) 4.0 - 10.5 K/uL   RBC 3.21 (L) 4.22 - 5.81 MIL/uL   Hemoglobin 10.3 (L) 13.0 - 17.0 g/dL   HCT 30.3 (L) 39.0 - 52.0 %   MCV 94.4 78.0 - 100.0 fL   MCH 32.1 26.0 - 34.0 pg   MCHC 34.0 30.0 - 36.0 g/dL   RDW 13.8 11.5 - 15.5 %   Platelets 115 (L) 150 - 400 K/uL    Discharge Medications:     Medication List    TAKE these medications        ALPRAZolam 1 MG tablet  Commonly known as:  XANAX  TAKE 1 TABLET BY MOUTH TWICE DAILY AS NEEDED     benazepril-hydrochlorthiazide 20-12.5 MG tablet  Commonly known as:  LOTENSIN HCT  Take 0.5 tablets by mouth daily.     docusate sodium 100 MG capsule  Commonly known as:  COLACE  Take 1 capsule (100 mg total) by mouth 2 (two) times daily.     ELIQUIS 5 MG Tabs tablet  Generic drug:  apixaban  TAKE 1 TABLET BY MOUTH TWICE DAILY     escitalopram 10 MG tablet  Commonly known as:  LEXAPRO  Take 1 tablet (10 mg total) by mouth daily.     methocarbamol 500 MG  tablet  Commonly known as:  ROBAXIN  Take 1 tablet (500 mg total) by mouth every 6 (six) hours as needed for muscle spasms.     nitroGLYCERIN 0.4 MG SL tablet  Commonly known as:  NITROSTAT  Place 1 tablet (0.4 mg total) under the tongue every 5 (five) minutes as needed for chest pain.     oxyCODONE-acetaminophen 5-325 MG tablet  Commonly known as:  PERCOCET/ROXICET  Take 1-2 tablets by mouth every 4 (four) hours as needed for moderate pain.     polyethylene glycol packet  Commonly known as:  MIRALAX / GLYCOLAX  Take 17 g by mouth daily as needed for mild constipation.        Diagnostic Studies: Dg Chest 2 View  11/05/2015  CLINICAL DATA:  Preoperative examination prior total knee joint replacement. EXAM: CHEST  2 VIEW COMPARISON:  Chest x-ray of June 20, 2010 FINDINGS: The lungs are hyperinflated but clear. The heart is top-normal in size. The pulmonary vascularity is not engorged. The mediastinum is normal in width. There is no pleural effusion. The bony thorax is unremarkable. IMPRESSION: COPD-reactive airway disease. There is no evidence of pneumonia, CHF, nor other acute cardiopulmonary abnormality. Electronically Signed   By: David  Martinique M.D.   On: 11/05/2015 09:37   Mr Knee Right Wo Contrast  10/20/2015  CLINICAL DATA:  Pain and swelling in the right knee since falling 8 months ago. EXAM: MRI OF THE RIGHT KNEE WITHOUT CONTRAST TECHNIQUE: Multiplanar, multisequence MR imaging of the knee was performed. No intravenous contrast was administered. COMPARISON:  None. FINDINGS: MENISCI Medial meniscus: Small oblique tear of the posterior horn of the medial meniscus extending to the inferior articular surface. Lateral meniscus: Complex tear of the body, anterior horn and posterior horn of  the lateral meniscus. LIGAMENTS Cruciates:  Intact ACL and PCL. Collaterals: Medial collateral ligament is intact. Lateral collateral ligament complex is intact. CARTILAGE Patellofemoral:  Chondromalacia  of the medial patellar facet. Medial: Partial thickness cartilage loss of the medial femorotibial compartment. Lateral: Full-thickness cartilage loss of the lateral femoral condyle and lateral tibial plateau with subchondral reactive marrow changes. Joint: Large joint effusion. Edema in Hoffa's fat. No plical thickening. Multiple loose bodies in the posterior joint space with the largest measuring 2.3 cm. Popliteal Fossa:  Small Baker cyst.  Intact popliteus tendon. Extensor Mechanism:  Intact. Bones: No other marrow signal abnormality. No fracture or dislocation. Soft tissue: Mild fatty atrophy of the medial gastrocnemius muscle. IMPRESSION: 1. Small oblique tear of the posterior horn of the medial meniscus extending to the inferior articular surface. 2. Complex tear of the body, anterior horn and posterior horn of the lateral meniscus. 3. Tricompartmental cartilage abnormalities as described above. 4. Large joint effusion.  Multiple loose bodies. 5. Small Baker cyst. Electronically Signed   By: Kathreen Devoid   On: 10/20/2015 14:56    Disposition:       Discharge Instructions    Call MD / Call 911    Complete by:  As directed   If you experience chest pain or shortness of breath, CALL 911 and be transported to the hospital emergency room.  If you develope a fever above 101 F, pus (white drainage) or increased drainage or redness at the wound, or calf pain, call your surgeon's office.     Constipation Prevention    Complete by:  As directed   Drink plenty of fluids.  Prune juice may be helpful.  You may use a stool softener, such as Colace (over the counter) 100 mg twice a day.  Use MiraLax (over the counter) for constipation as needed.     Diet - low sodium heart healthy    Complete by:  As directed      Discharge instructions    Complete by:  As directed   INSTRUCTIONS AFTER JOINT REPLACEMENT   Remove items at home which could result in a fall. This includes throw rugs or furniture in walking  pathways ICE to the affected joint every three hours while awake for 30 minutes at a time, for at least the first 3-5 days, and then as needed for pain and swelling.  Continue to use ice for pain and swelling. You may notice swelling that will progress down to the foot and ankle.  This is normal after surgery.  Elevate your leg when you are not up walking on it.   Continue to use the breathing machine you got in the hospital (incentive spirometer) which will help keep your temperature down.  It is common for your temperature to cycle up and down following surgery, especially at night when you are not up moving around and exerting yourself.  The breathing machine keeps your lungs expanded and your temperature down.   DIET:  As you were doing prior to hospitalization, we recommend a well-balanced diet.  DRESSING / WOUND CARE / SHOWERING  You may change your dressing every day with sterile gauze.  Please use good hand washing techniques before changing the dressing.  Do not use any lotions or creams on the incision until instructed by your surgeon.  ACTIVITY  Increase activity slowly as tolerated, but follow the weight bearing instructions below.   No driving for 6 weeks or until further direction given by your physician.  You cannot drive while taking narcotics.  No lifting or carrying greater than 10 lbs. until further directed by your surgeon. Avoid periods of inactivity such as sitting longer than an hour when not asleep. This helps prevent blood clots.  You may return to work once you are authorized by your doctor.     WEIGHT BEARING   Weight bearing as tolerated with assist device (walker, cane, etc) as directed, use it as long as suggested by your surgeon or therapist, typically at least 4-6 weeks.   EXERCISES  Results after joint replacement surgery are often greatly improved when you follow the exercise, range of motion and muscle strengthening exercises prescribed by your doctor.  Safety measures are also important to protect the joint from further injury. Any time any of these exercises cause you to have increased pain or swelling, decrease what you are doing until you are comfortable again and then slowly increase them. If you have problems or questions, call your caregiver or physical therapist for advice.   Rehabilitation is important following a joint replacement. After just a few days of immobilization, the muscles of the leg can become weakened and shrink (atrophy).  These exercises are designed to build up the tone and strength of the thigh and leg muscles and to improve motion. Often times heat used for twenty to thirty minutes before working out will loosen up your tissues and help with improving the range of motion but do not use heat for the first two weeks following surgery (sometimes heat can increase post-operative swelling).   These exercises can be done on a training (exercise) mat, on the floor, on a table or on a bed. Use whatever works the best and is most comfortable for you.    Use music or television while you are exercising so that the exercises are a pleasant break in your day. This will make your life better with the exercises acting as a break in your routine that you can look forward to.   Perform all exercises about fifteen times, three times per day or as directed.  You should exercise both the operative leg and the other leg as well.  Exercises include:   Quad Sets - Tighten up the muscle on the front of the thigh (Quad) and hold for 5-10 seconds.   Straight Leg Raises - With your knee straight (if you were given a brace, keep it on), lift the leg to 60 degrees, hold for 3 seconds, and slowly lower the leg.  Perform this exercise against resistance later as your leg gets stronger.  Leg Slides: Lying on your back, slowly slide your foot toward your buttocks, bending your knee up off the floor (only go as far as is comfortable). Then slowly slide your  foot back down until your leg is flat on the floor again.  Angel Wings: Lying on your back spread your legs to the side as far apart as you can without causing discomfort.  Hamstring Strength:  Lying on your back, push your heel against the floor with your leg straight by tightening up the muscles of your buttocks.  Repeat, but this time bend your knee to a comfortable angle, and push your heel against the floor.  You may put a pillow under the heel to make it more comfortable if necessary.   A rehabilitation program following joint replacement surgery can speed recovery and prevent re-injury in the future due to weakened muscles. Contact your doctor or a physical therapist for  more information on knee rehabilitation.    CONSTIPATION  Constipation is defined medically as fewer than three stools per week and severe constipation as less than one stool per week.  Even if you have a regular bowel pattern at home, your normal regimen is likely to be disrupted due to multiple reasons following surgery.  Combination of anesthesia, postoperative narcotics, change in appetite and fluid intake all can affect your bowels.   YOU MUST use at least one of the following options; they are listed in order of increasing strength to get the job done.  They are all available over the counter, and you may need to use some, POSSIBLY even all of these options:    Drink plenty of fluids (prune juice may be helpful) and high fiber foods Colace 100 mg by mouth twice a day  Senokot for constipation as directed and as needed Dulcolax (bisacodyl), take with full glass of water  Miralax (polyethylene glycol) once or twice a day as needed.  If you have tried all these things and are unable to have a bowel movement in the first 3-4 days after surgery call either your surgeon or your primary doctor.    If you experience loose stools or diarrhea, hold the medications until you stool forms back up.  If your symptoms do not get  better within 1 week or if they get worse, check with your doctor.  If you experience "the worst abdominal pain ever" or develop nausea or vomiting, please contact the office immediately for further recommendations for treatment.   ITCHING:  If you experience itching with your medications, try taking only a single pain pill, or even half a pain pill at a time.  You can also use Benadryl over the counter for itching or also to help with sleep.   TED HOSE STOCKINGS:  Use stockings on both legs until for at least 2 weeks or as directed by physician office. They may be removed at night for sleeping.  MEDICATIONS:  See your medication summary on the "After Visit Summary" that nursing will review with you.  You may have some home medications which will be placed on hold until you complete the course of blood thinner medication.  It is important for you to complete the blood thinner medication as prescribed.  PRECAUTIONS:  If you experience chest pain or shortness of breath - call 911 immediately for transfer to the hospital emergency department.   If you develop a fever greater that 101 F, purulent drainage from wound, increased redness or drainage from wound, foul odor from the wound/dressing, or calf pain - CONTACT YOUR SURGEON.                                                   FOLLOW-UP APPOINTMENTS:  If you do not already have a post-op appointment, please call the office for an appointment to be seen by your surgeon.  Guidelines for how soon to be seen are listed in your "After Visit Summary", but are typically between 1-4 weeks after surgery.   MAKE SURE YOU:  Understand these instructions.  Get help right away if you are not doing well or get worse.    Thank you for letting us be a part of your medical care team.  It is a privilege we respect greatly.  We hope these  instructions will help you stay on track for a fast and full recovery!     Increase activity slowly as tolerated    Complete by:   As directed            Follow-up Information    Follow up with GIOFFRE,RONALD A, MD. Schedule an appointment as soon as possible for a visit in 2 weeks.   Specialty:  Orthopedic Surgery   Contact information:   [address] [place] [address] [phone]       Follow up with [facility].   Why:  home health physical therapy   Contact information:   [address] [place] [address] [phone]       Follow up with [facility].   Why:  3n1 (bedside commode)   Contact information:   [address] [place] [address] [phone]       Follow up with [facility] .   Specialty:  [place] information:   [address] [place] [phone]       Signed: Ventura Bruns 11/16/2015, 7:25 AM

## 2015-11-16 NOTE — Clinical Social Work Placement (Addendum)
   CLINICAL SOCIAL WORK PLACEMENT  NOTE  Date:  11/16/2015  Patient Details  Name: Randall Lara MRN: [id number] Date of Birth: [birth year]  Clinical Social Work is seeking post-discharge placement for this patient at the [place] level of care (*CSW will initial, date and re-position this form in  chart as items are completed):  Yes   Patient/family provided with [place] Work Department's list of facilities offering this level of care within the geographic area requested by the patient (or if unable, by the patient's family).  Yes   Patient/family informed of their freedom to choose among providers that offer the needed level of care, that participate in Medicare, Medicaid or managed care program needed by the patient, have an available bed and are willing to accept the patient.  Yes   Patient/family informed of [facility]'s ownership interest in [facility] and [facility], as well as of the fact that they are under no obligation to receive care at these facilities.  PASRR submitted to EDS on 11/15/15     PASRR number received on 11/15/15     Existing PASRR number confirmed on       FL2 transmitted to all facilities in geographic area requested by pt/family on 11/15/15     FL2 transmitted to all facilities within larger geographic area on       Patient informed that his/her managed care company has contracts with or will negotiate with certain facilities, including the following:        Yes   Patient/family informed of bed offers received.  Patient chooses bed at [facility]     Physician recommends and patient chooses bed at      Patient to be transferred to [facility] on 11/16/15.  Patient to be transferred to facility by Patient will be transported by the patient's son.     Patient family notified on 11/16/15 of transfer.  Name of family member notified:  Rudene (wife) at bedside     PHYSICIAN Please  prepare priority discharge summary, including medications, Please prepare prescriptions, Please sign FL2     Additional Comment:  Per MD patient ready for DC to [place]. RN, patient, patient's family, and facility notified of DC. RN given number for report. DC packet on chart. Family will transport. CSW signing off.   _______________________________________________ Rigoberto Noel, LCSW 11/16/2015, 11:12 AM

## 2015-11-16 NOTE — Progress Notes (Signed)
Physical Therapy Treatment Patient Details Name: Randall Lara MRN: [id number] DOB: [birth year] Today's Date: 11/16/2015    History of Present Illness Pt s/p R TKR with hx ofA-fib    PT Comments    Pt proressing well, requiring less assist overall today; plans to D/C to SNF today  Follow Up Recommendations  SNF     Equipment Recommendations  None recommended by PT    Recommendations for Other Services OT consult     Precautions / Restrictions Precautions Precautions: Knee;Fall Precaution Comments: Instructed on KI use for amb pt and spouse Required Braces or Orthoses: Knee Immobilizer - Right Knee Immobilizer - Right: Discontinue once straight leg raise with < 10 degree lag Restrictions Weight Bearing Restrictions: No Other Position/Activity Restrictions: WBAT    Mobility  Bed Mobility               General bed mobility comments: NT --pt in chair  Transfers Overall transfer level: Needs assistance Equipment used: Rolling walker (2 wheeled) Transfers: Sit to/from Stand Sit to Stand: Min guard         General transfer comment: verbal cues for hand placement  Ambulation/Gait Ambulation/Gait assistance: Min assist Ambulation Distance (Feet): 60 Feet Assistive device: Rolling walker (2 wheeled) Gait Pattern/deviations: Step-to pattern;Trunk flexed;Drifts right/left     General Gait Details: multi-modal cues for RW safety, technique, sequencing   Stairs            Wheelchair Mobility    Modified Rankin (Stroke Patients Only)       Balance                                    Cognition Arousal/Alertness: Awake/alert Behavior During Therapy: Impulsive Overall Cognitive Status: Impaired/Different from baseline Area of Impairment: Safety/judgement;Following commands;Problem solving     Memory: Decreased short-term memory;Decreased recall of precautions Following Commands: Follows one step commands with increased  time Safety/Judgement: Decreased awareness of safety;Decreased awareness of deficits   Problem Solving: Requires verbal cues;Requires tactile cues;Difficulty sequencing      Exercises Total Joint Exercises Ankle Circles/Pumps: AROM;Both;15 reps;Supine Quad Sets: AROM;Strengthening;Both;10 reps Heel Slides: AAROM;Right;10 reps    General Comments        Pertinent Vitals/Pain Pain Assessment: Faces Faces Pain Scale: Hurts a little bit Pain Location: R knee Pain Descriptors / Indicators: Sore Pain Intervention(s): Limited activity within patient's tolerance;Monitored during session;Premedicated before session;Ice applied    Home Living                      Prior Function            PT Goals (current goals can now be found in the care plan section) Acute Rehab PT Goals Patient Stated Goal: Get back to gardening PT Goal Formulation: With patient Time For Goal Achievement: 11/18/15 Potential to Achieve Goals: Good Progress towards PT goals: Progressing toward goals    Frequency  7X/week    PT Plan Discharge plan needs to be updated    Co-evaluation             End of Session Equipment Utilized During Treatment: Gait belt;Right knee immobilizer Activity Tolerance: Patient tolerated treatment well Patient left: in chair;with call bell/phone within reach;with chair alarm set     Time: [id number] PT Time Calculation (min) (ACUTE ONLY): 16 min  Charges:  $Gait Training: 8-22 mins  G CodesKenyon Ana 11/16/2015, 11:17 AM

## 2015-11-20 ENCOUNTER — Ambulatory Visit: Payer: Medicare Other | Admitting: Family Medicine

## 2015-11-26 DIAGNOSIS — Z96641 Presence of right artificial hip joint: Secondary | ICD-10-CM | POA: Diagnosis not present

## 2015-11-26 DIAGNOSIS — Z471 Aftercare following joint replacement surgery: Secondary | ICD-10-CM | POA: Diagnosis not present

## 2015-11-29 ENCOUNTER — Ambulatory Visit (INDEPENDENT_AMBULATORY_CARE_PROVIDER_SITE_OTHER): Payer: Medicare Other | Admitting: Family Medicine

## 2015-11-29 ENCOUNTER — Encounter: Payer: Self-pay | Admitting: Family Medicine

## 2015-11-29 VITALS — BP 110/70 | HR 93 | Temp 97.8°F | Ht 72.0 in | Wt 207.0 lb

## 2015-11-29 DIAGNOSIS — M25561 Pain in right knee: Secondary | ICD-10-CM

## 2015-11-29 NOTE — Progress Notes (Signed)
Pre visit review using our clinic review tool, if applicable. No additional management support is needed unless otherwise documented below in the visit note. 

## 2015-11-29 NOTE — Patient Instructions (Signed)
Follow up promptly for any fever, chills, or increased redness or swelling Suspect your current swelling and mild warmth is post-operative inflammation which is to be expected and will remove with time.

## 2015-11-29 NOTE — Progress Notes (Signed)
   Subjective:    Patient ID: Randall Lara, male    DOB: 22-May-1930, 82 y.o.   MRN: [id number]  HPI  Patient came in today with right knee pain. He had right total knee replacement 16 days ago and just earlier this week came home after rehabilitation stay. There apparently was some concern about postoperative cellulitis. He is finishing up Cipro 750 mg twice daily tomorrow. He saw his orthopedist (Dr Rushie Nyhan) this past Tuesday and they did not have concerns about any further antibiotics.  He has not had any fevers or chills. He is ambulating very well with walker. He start outpatient physical therapy this coming Monday. He has some itching of the knee but pain is slowly improving. They inquire about possible "muscle relaxer ". He has a long history of anxiety and is maintained on low-dose alprazolam chronically  Past Medical History  Diagnosis Date  . Atrial fibrillation ([place])   . Joint pain   . Obesity   . Hypertension   . Anxiety   . Heart palpitations   . Dysrhythmia     atrial fib  . Depression   . GERD (gastroesophageal reflux disease)     no meds needed- diet controlled   Past Surgical History  Procedure Laterality Date  . Esophagogastroduodenoscopy  03/29/2002    [id number] esophagus, stomach, and duodenum through the second  portion.  Marland Kitchen Hernia repair    . Total knee arthroplasty Right 11/13/2015    Procedure: TOTAL KNEE ARTHROPLASTY;  Surgeon: Latanya Maudlin, MD;  Location: [facility];  Service: Orthopedics;  Laterality: Right;    reports that he quit smoking about 40 years ago. His smoking use included Cigarettes. He started smoking about 60 years ago. He has a 20 pack-year smoking history. He has never used smokeless tobacco. He reports that he does not drink alcohol or use illicit drugs. family history includes Heart attack in his brother; Hypertension in his mother and sister; Stroke in his father, mother, and sister. There is no history of Colon cancer. Allergies    Allergen Reactions  . Ferrous Sulfate     REACTION: swelling, hives. Doesn't recall.  . Sulfa Antibiotics Hives and Swelling     Review of Systems  Constitutional: Negative for fever and chills.  Respiratory: Negative for cough and shortness of breath.   Cardiovascular: Negative for chest pain.       Objective:   Physical Exam  Constitutional: He appears well-developed and well-nourished.  Cardiovascular: Normal rate and regular rhythm.   Pulmonary/Chest: Effort normal and breath sounds normal. No respiratory distress. He has no wheezes. He has no rales.  Musculoskeletal:  Right knee incision looks good. No drainage. Steri-Strips in place. He has as expected some edema in minimal warmth of the knee with very minimal erythema.          Assessment & Plan:  Right knee pain. Post surgical inflammatory changes as expected. He does not have evidence for active infection this time. He is encouraged to follow-up promptly with orthopedist if he develops any fever, chills, or increasing redness or warmth. He is progressing very well this far and ambulating extremely well with walker here in the office today. We've advised to try to avoid muscle relaxers especially at his age and also with chronic alprazolam use. We talked about the risk of combining benzodiazepines and sedating medications and they understand  [name] MD [facility] at [facility]

## 2015-12-02 ENCOUNTER — Ambulatory Visit: Payer: Medicare Other | Admitting: Physical Therapy

## 2015-12-03 ENCOUNTER — Ambulatory Visit: Payer: Medicare Other | Attending: Orthopedic Surgery | Admitting: Physical Therapy

## 2015-12-03 ENCOUNTER — Encounter: Payer: Self-pay | Admitting: Physical Therapy

## 2015-12-03 DIAGNOSIS — R600 Localized edema: Secondary | ICD-10-CM | POA: Insufficient documentation

## 2015-12-03 DIAGNOSIS — M25661 Stiffness of right knee, not elsewhere classified: Secondary | ICD-10-CM | POA: Diagnosis not present

## 2015-12-03 DIAGNOSIS — M25561 Pain in right knee: Secondary | ICD-10-CM | POA: Diagnosis not present

## 2015-12-03 NOTE — Therapy (Signed)
[facility] [place], [place], [address] Phone: [phone]   Fax:  [phone]  Physical Therapy Evaluation  Patient Details  Name: Randall Lara MRN: [id number] Date of Birth: January 24, 1930 Referring Provider: Latanya Maudlin, MD  Encounter Date: 12/03/2015      PT End of Session - 12/03/15 0820    Visit Number 1   Number of Visits 12   Date for PT Re-Evaluation 01/14/16   PT Start Time 0820   PT Stop Time 0903   PT Time Calculation (min) 43 min   Activity Tolerance Patient tolerated treatment well   Behavior During Therapy [facility] for tasks assessed/performed      Past Medical History  Diagnosis Date  . Atrial fibrillation ([place])   . Joint pain   . Obesity   . Hypertension   . Anxiety   . Heart palpitations   . Dysrhythmia     atrial fib  . Depression   . GERD (gastroesophageal reflux disease)     no meds needed- diet controlled    Past Surgical History  Procedure Laterality Date  . Esophagogastroduodenoscopy  03/29/2002    [id number] esophagus, stomach, and duodenum through the second  portion.  Marland Kitchen Hernia repair    . Total knee arthroplasty Right 11/13/2015    Procedure: TOTAL KNEE ARTHROPLASTY;  Surgeon: Latanya Maudlin, MD;  Location: [facility];  Service: Orthopedics;  Laterality: Right;    There were no vitals filed for this visit.       Subjective Assessment - 12/03/15 0814    Subjective Patient had R TKA 11/13/15. Patient reports that he has had swelling in his R knee for many years. Surgery aggravated his sciatic nerve.   Pertinent History HTN, Afib   Patient Stated Goals Decrease pain increase movement   Currently in Pain? Yes   Pain Score 6    Pain Location Knee   Pain Orientation Right   Pain Descriptors / Indicators Aching   Pain Type Surgical pain   Pain Radiating Towards R ITB   Pain Onset 1 to 4 weeks ago   Aggravating Factors  sleeping   Pain Relieving Factors meds            [facility] PT  Assessment - 12/03/15 0001    Assessment   Medical Diagnosis Aftercare R TKR   Referring Provider Latanya Maudlin, MD   Onset Date/Surgical Date 11/13/15   Next MD Visit 12/17/15   Prior Therapy nursing home x 10 days until 11/27/15   Balance Screen   Has the patient fallen in the past 6 months No   Has the patient had a decrease in activity level because of a fear of falling?  No   Is the patient reluctant to leave their home because of a fear of falling?  No   Home Ecologist residence   Home Access Stairs to enter   Entrance Stairs-Number of Steps 1   Prior Function   Level of Independence Independent with basic ADLs   Vocation Retired   Observation/Other Assessments   Focus on Therapeutic Outcomes (FOTO)  70% limited   ROM / Strength   AROM / PROM / Strength AROM;PROM;Strength   AROM   AROM Assessment Site Knee   Right/Left Knee Right   Right Knee Extension -10   Right Knee Flexion 80   PROM   PROM Assessment Site Knee   Right/Left Knee Right   Right Knee Extension -8  Right Knee Flexion 92   Strength   Overall Strength Comments R hip 4+/5, R knee flex 4+/5, ext 5/5;   Palpation   Patella mobility WNL   Palpation comment tender along R ITB   Ambulation/Gait   Ambulation/Gait Yes   Ambulation Distance (Feet) 60 Feet   Assistive device None   Gait Pattern Step-through pattern;Decreased stance time - right;Decreased step length - right   Ambulation Surface Level                   [facility] Adult PT Treatment/Exercise - 12/03/15 0001    Exercises   Exercises Knee/Hip   Knee/Hip Exercises: Aerobic   Nustep L3 x 12 min (started at seat 11 moved to 10)                PT Education - 12/03/15 1252    Education provided Yes   Education Details HEP   Person(s) Educated Patient;Spouse   Methods Explanation;Demonstration;Handout   Comprehension Verbalized understanding;Returned demonstration             PT Long Term  Goals - 12/03/15 1257    PT LONG TERM GOAL #1   Title I with HEP   Time 6   Period Weeks   Status New   PT LONG TERM GOAL #2   Title improved R knee ROM -2 to 120 degrees to normalize gait.   Time 6   Period Weeks   Status New   PT LONG TERM GOAL #3   Title Patient able to perform ADLs with 2/10 or less.   Time 6   Status New   PT LONG TERM GOAL #4   Title Patient to demo 5/5 R knee strength.   Time 6   Period Weeks   Status New               Plan - 12/03/15 1252    Clinical Impression Statement Patient presents 3 weeks s/p R TKA. He had PT in a nursing home until 7 days ago. He amb without AD however he does use RW intermittently if he feels unsafe. He has some decreased scar mobility and decreased ROM and pain affecting ADLS. He has increased edema in R knee.   Rehab Potential Excellent   PT Frequency 3x / week   PT Duration 6 weeks   PT Treatment/Interventions ADLs/Self Care Home Management;Electrical Stimulation;Cryotherapy;Therapeutic exercise;Neuromuscular re-education;Patient/family education;Manual techniques;Vasopneumatic Device;Passive range of motion;Scar mobilization   PT Next Visit Plan TKA protocol, modalities for pain; scar massage, Manual to R ITB, estim for quad strength if indicated.   PT Home Exercise Plan flex/ext stretches; QS, heel slides, LAQ, seated marching   Consulted and Agree with Plan of Care Patient      Patient will benefit from skilled therapeutic intervention in order to improve the following deficits and impairments:  Abnormal gait, Decreased range of motion, Pain, Decreased scar mobility, Increased edema, Decreased strength  Visit Diagnosis: Pain in right knee - Plan: PT plan of care cert/re-cert  Stiffness of right knee, not elsewhere classified - Plan: PT plan of care cert/re-cert  Localized edema - Plan: PT plan of care cert/re-cert      G-Codes - [id number]    Functional Assessment Tool Used FOTO 70% limited   Functional  Limitation Mobility: Walking and moving around   Mobility: Walking and Moving Around Current Status [id number]) At least 60 percent but less than 80 percent impaired, limited or restricted   Mobility: Walking and Moving  Around Goal Status [phone]) At least 40 percent but less than 60 percent impaired, limited or restricted       Problem List Patient Active Problem List   Diagnosis Date Noted  . History of total knee arthroplasty 11/13/2015  . Aortic valve disease 08/23/2014  . Osteoarthritis of both knees 08/23/2014  . History of atrial fibrillation 05/15/2014  . Prediabetes 11/14/2013  . Chest pain 06/16/2013  . Obesity (BMI 30-39.9) 05/15/2013  . Globus sensation 12/01/2012  . GERD (gastroesophageal reflux disease) 11/10/2012  . Joint pain   . Obesity   . Heart palpitations   . HYPERGLYCEMIA 05/08/2010  . Anxiety state 02/11/2009  . Essential hypertension 02/11/2009   Madelyn Flavors PT  12/03/2015, 2:00 PM  [facility] [address] [place], [place], [address] Phone: [phone]   Fax:  [phone]  Name: AMONI TUREAUD MRN: [id number] Date of Birth: Dec 21, 1929

## 2015-12-03 NOTE — Patient Instructions (Signed)
  Chair Knee Flexion    Keeping feet on floor, slide foot of operated leg back, bending knee. Hold _30___ seconds. Repeat __5__ times. Do 4____ sessions a day.  http://gt2.exer.us/304   Copyright  VHI. All rights reserved.  Knee Extension Mobilization: Towel Prop    With rolled towel under right ankle, place ____ pound weight across knee. Hold _1-5___ minutes. Repeat _4___ times per day.  http://orth.exer.us/720   Copyright  VHI. All rights reserved.     Ankle Pump  Bend ankles up and down, alternating feet. Repeat _20___ times. Do __3__ sessions per day.  Quad Set  With other leg bent, foot flat, Place a rolled towel under the right knee and slowly tighten muscles on thigh pushing knee into towel. Hold 5 seconds. Repeat __20__ times. Do __3__ sessions per day.   Heel Slide  Bend left knee and pull heel toward buttocks. Use sheet, strap, or rope to actively assist knee further into flexion. Hold stretch 10 secs.  Repeat _10___ times. Do _3___ sessions per day.  KNEE: Extension, Long Arc Quads - Sitting   Raise leg until knee is straight. 10___ reps per set, 2___ sets per session, 3 times per day.   FLEXION: Sitting (Active)  Sit, both feet flat. Lift right knee toward ceiling.  Complete __2_ sets of 10___ repetitions. Perform _3__ sessions per day.  Copyright  VHI. All rights reserved.   Madelyn Flavors, PT 12/03/2015 8:57 AM [facility] [address] [place], [place], [address] Phone: [phone]   Fax:  [phone]

## 2015-12-05 ENCOUNTER — Encounter: Payer: Self-pay | Admitting: Physical Therapy

## 2015-12-05 ENCOUNTER — Ambulatory Visit: Payer: Medicare Other | Admitting: Physical Therapy

## 2015-12-05 DIAGNOSIS — M25561 Pain in right knee: Secondary | ICD-10-CM | POA: Diagnosis not present

## 2015-12-05 DIAGNOSIS — M25661 Stiffness of right knee, not elsewhere classified: Secondary | ICD-10-CM

## 2015-12-05 DIAGNOSIS — R600 Localized edema: Secondary | ICD-10-CM

## 2015-12-05 NOTE — Therapy (Signed)
[facility] [place], [place], [address] Phone: [phone]   Fax:  [phone]  Physical Therapy Treatment  Patient Details  Name: Randall Lara MRN: [id number] Date of Birth: Mar 06, 1930 Referring Provider: Latanya Maudlin, MD  Encounter Date: 12/05/2015      PT End of Session - 12/05/15 0936    Visit Number 2   Number of Visits 12   Date for PT Re-Evaluation 01/14/16   PT Start Time 0948   PT Stop Time 1041   PT Time Calculation (min) 53 min   Activity Tolerance Patient tolerated treatment well   Behavior During Therapy [facility] for tasks assessed/performed      Past Medical History  Diagnosis Date  . Atrial fibrillation ([place])   . Joint pain   . Obesity   . Hypertension   . Anxiety   . Heart palpitations   . Dysrhythmia     atrial fib  . Depression   . GERD (gastroesophageal reflux disease)     no meds needed- diet controlled    Past Surgical History  Procedure Laterality Date  . Esophagogastroduodenoscopy  03/29/2002    [id number] esophagus, stomach, and duodenum through the second  portion.  Marland Kitchen Hernia repair    . Total knee arthroplasty Right 11/13/2015    Procedure: TOTAL KNEE ARTHROPLASTY;  Surgeon: Latanya Maudlin, MD;  Location: [facility];  Service: Orthopedics;  Laterality: Right;    There were no vitals filed for this visit.      Subjective Assessment - 12/05/15 0936    Subjective Reports that he feels better and asked regarding completion of HEP at home and how many times a day he should do the HEP.   Patient is accompained by: Family member  Wife   Pertinent History HTN, Afib   Patient Stated Goals Decrease pain increase movement   Currently in Pain? Yes   Pain Score 3    Pain Location Knee   Pain Orientation Right   Pain Descriptors / Indicators Discomfort   Pain Type Surgical pain   Pain Onset 1 to 4 weeks ago            [facility] PT Assessment - 12/05/15 0001    Assessment   Medical  Diagnosis Aftercare R TKR   Onset Date/Surgical Date 11/13/15   Next MD Visit 12/17/15   Prior Therapy nursing home x 10 days until 11/27/15                     [facility] Adult PT Treatment/Exercise - 12/05/15 0001    Knee/Hip Exercises: Aerobic   Nustep L4, seat 12-8 x14 min   Knee/Hip Exercises: Standing   Forward Lunges Right;2 sets;10 reps;3 seconds  6" step   Rocker Board 3 minutes   Knee/Hip Exercises: Seated   Long Arc Quad Strengthening;Right;2 sets;10 reps;Weights   Long Arc Quad Weight 3 lbs.   Modalities   Modalities Science writer Action IFC   Electrical Stimulation Parameters 1-10 Hz x15 min   Electrical Stimulation Goals Pain;Edema   Vasopneumatic   Number Minutes Vasopneumatic  15 minutes   Vasopnuematic Location  Knee   Vasopneumatic Pressure Medium   Vasopneumatic Temperature  34   Manual Therapy   Manual Therapy Passive ROM;Soft tissue mobilization   Soft tissue mobilization R patellar mobilizations in L/R, sup/inf directions to promote proper mobility   Passive ROM PROM of  R knee into flex/ext with gentle holds at end range                     PT Long Term Goals - 12/03/15 1257    PT LONG TERM GOAL #1   Title I with HEP   Time 6   Period Weeks   Status New   PT LONG TERM GOAL #2   Title improved R knee ROM -2 to 120 degrees to normalize gait.   Time 6   Period Weeks   Status New   PT LONG TERM GOAL #3   Title Patient able to perform ADLs with 2/10 or less.   Time 6   Status New   PT LONG TERM GOAL #4   Title Patient to demo 5/5 R knee strength.   Time 6   Period Weeks   Status New               Plan - 12/05/15 1034    Clinical Impression Statement Patient tolerated today's treatment well with no reports of increased pain or discomfort with any of the exercises. Patient was very eager to increase R knee  flexion on NuStep today. Patient required moderate mutlmodal cueing for proper exercise technique and will required more cueing for proper lunge techinque. Decreased R Quad activation noted as patient and wife requested more cueing for quad set technique for HEP. Good R patellar mobility noted with patellar mobilizations today in both directions assessed. Firm end feels noted with PROM of R Knee into both flexion and extension and pain noted mostly at very end range of flexion per patient report. Normal modalites response noted following removal of the modalities. Patient ambulated into clinic without an AD today. Both patient and wife reported compliance with HEP and massage to R ITB. Patient and wife was encouraged to complete exercises 2-3x per day especially over holiday weekend.   Rehab Potential Excellent   PT Frequency 3x / week   PT Duration 6 weeks   PT Treatment/Interventions ADLs/Self Care Home Management;Electrical Stimulation;Cryotherapy;Therapeutic exercise;Neuromuscular re-education;Patient/family education;Manual techniques;Vasopneumatic Device;Passive range of motion;Scar mobilization   PT Next Visit Plan TKA protocol, modalities for pain; scar massage, Manual to R ITB, estim for quad strength if indicated.   PT Home Exercise Plan flex/ext stretches; QS, heel slides, LAQ, seated marching   Consulted and Agree with Plan of Care Patient;Family member/caregiver   Family Member Consulted Wife      Patient will benefit from skilled therapeutic intervention in order to improve the following deficits and impairments:  Abnormal gait, Decreased range of motion, Pain, Decreased scar mobility, Increased edema, Decreased strength  Visit Diagnosis: Pain in right knee  Stiffness of right knee, not elsewhere classified  Localized edema     Problem List Patient Active Problem List   Diagnosis Date Noted  . History of total knee arthroplasty 11/13/2015  . Aortic valve disease 08/23/2014   . Osteoarthritis of both knees 08/23/2014  . History of atrial fibrillation 05/15/2014  . Prediabetes 11/14/2013  . Chest pain 06/16/2013  . Obesity (BMI 30-39.9) 05/15/2013  . Globus sensation 12/01/2012  . GERD (gastroesophageal reflux disease) 11/10/2012  . Joint pain   . Obesity   . Heart palpitations   . HYPERGLYCEMIA 05/08/2010  . Anxiety state 02/11/2009  . Essential hypertension 02/11/2009    Wynelle Fanny, PTA 12/05/2015, 10:44 AM  [facility] [address] [place], [place], [address] Phone: [phone]   Fax:  [phone]  Name: Randall Lara MRN: [id number] Date of Birth: 03-30-1930

## 2015-12-10 ENCOUNTER — Ambulatory Visit: Payer: Medicare Other | Admitting: Physical Therapy

## 2015-12-10 DIAGNOSIS — R600 Localized edema: Secondary | ICD-10-CM | POA: Diagnosis not present

## 2015-12-10 DIAGNOSIS — M25661 Stiffness of right knee, not elsewhere classified: Secondary | ICD-10-CM

## 2015-12-10 DIAGNOSIS — M25561 Pain in right knee: Secondary | ICD-10-CM

## 2015-12-10 NOTE — Therapy (Signed)
[facility] [place], [place], [address] Phone: [phone]   Fax:  [phone]  Physical Therapy Treatment  Patient Details  Name: Randall Lara MRN: [id number] Date of Birth: 1930-07-08 Referring Provider: Latanya Maudlin, MD  Encounter Date: 12/10/2015      PT End of Session - 12/10/15 0945    Visit Number 3   Number of Visits 12   Date for PT Re-Evaluation 01/14/16   PT Start Time 0946   PT Stop Time 1050   PT Time Calculation (min) 64 min   Activity Tolerance Patient tolerated treatment well   Behavior During Therapy [facility] for tasks assessed/performed      Past Medical History  Diagnosis Date  . Atrial fibrillation ([place])   . Joint pain   . Obesity   . Hypertension   . Anxiety   . Heart palpitations   . Dysrhythmia     atrial fib  . Depression   . GERD (gastroesophageal reflux disease)     no meds needed- diet controlled    Past Surgical History  Procedure Laterality Date  . Esophagogastroduodenoscopy  03/29/2002    [id number] esophagus, stomach, and duodenum through the second  portion.  Marland Kitchen Hernia repair    . Total knee arthroplasty Right 11/13/2015    Procedure: TOTAL KNEE ARTHROPLASTY;  Surgeon: Latanya Maudlin, MD;  Location: [facility];  Service: Orthopedics;  Laterality: Right;    There were no vitals filed for this visit.      Subjective Assessment - 12/10/15 0947    Subjective Patient presents with no new complaints, just stiffness. He states he is sleeping better and that his swelling has decreased.   Patient is accompained by: Family member   Pertinent History HTN, Afib   Patient Stated Goals Decrease pain increase movement   Currently in Pain? Yes   Pain Score 5    Pain Location Knee   Pain Orientation Right   Pain Descriptors / Indicators Discomfort   Pain Type Surgical pain   Pain Onset 1 to 4 weeks ago   Pain Frequency Constant   Pain Relieving Factors meds                          [facility] Adult PT Treatment/Exercise - 12/10/15 0001    Knee/Hip Exercises: Aerobic   Nustep L4, seat 8 to 7 x 12 min   Knee/Hip Exercises: Standing   Heel Raises 20 reps   Terminal Knee Extension Limitations 20 with red band   Lateral Step Up 10 reps;Hand Hold: 2;Step Height: 8"   Forward Step Up Right;2 sets;10 reps;Hand Hold: 2;Step Height: 6";Step Height: 8"   Knee/Hip Exercises: Seated   Long Arc Quad Strengthening;Right;2 sets;10 reps;Weights   Long Arc Quad Weight 3 lbs.   Modalities   Modalities Associate Professor IFC   Electrical Stimulation Parameters 1-10HZ  to tolerance x 15 min   Electrical Stimulation Goals Edema;Pain   Vasopneumatic   Number Minutes Vasopneumatic  15 minutes   Vasopnuematic Location  Knee   Vasopneumatic Pressure Medium   Vasopneumatic Temperature  34   Manual Therapy   Manual Therapy Joint mobilization;Passive ROM;Myofascial release   Joint Mobilization R knee and patellar mobs   Myofascial Release to R ITB in SDLY   Passive ROM PROM to R knee into flex/ext with long holds into extension  PT Education - 12/10/15 1037    Education provided Yes   Education Details change QS to [employer]) Educated Patient;Spouse   Methods Explanation;Demonstration;Handout   Comprehension Verbalized understanding;Returned demonstration             PT Long Term Goals - 12/03/15 1257    PT LONG TERM GOAL #1   Title I with HEP   Time 6   Period Weeks   Status New   PT LONG TERM GOAL #2   Title improved R knee ROM -2 to 120 degrees to normalize gait.   Time 6   Period Weeks   Status New   PT LONG TERM GOAL #3   Title Patient able to perform ADLs with 2/10 or less.   Time 6   Status New   PT LONG TERM GOAL #4   Title Patient to demo 5/5 R knee strength.   Time 6   Period  Weeks   Status New               Plan - 12/10/15 1041    Clinical Impression Statement Patient tolerated treatment well today. He requires VCs for correct form with step ups, heel raises and TKE. He returns to MD in one week and needs MD note next visit.   PT Next Visit Plan MD note for appt 12/17/15. TKA protocol, modalities for pain; scar massage, Manual to R ITB, estim for quad strength if indicated.   Consulted and Agree with Plan of Care Patient      Patient will benefit from skilled therapeutic intervention in order to improve the following deficits and impairments:  Abnormal gait, Decreased range of motion, Pain, Decreased scar mobility, Increased edema, Decreased strength  Visit Diagnosis: Pain in right knee  Stiffness of right knee, not elsewhere classified  Localized edema     Problem List Patient Active Problem List   Diagnosis Date Noted  . History of total knee arthroplasty 11/13/2015  . Aortic valve disease 08/23/2014  . Osteoarthritis of both knees 08/23/2014  . History of atrial fibrillation 05/15/2014  . Prediabetes 11/14/2013  . Chest pain 06/16/2013  . Obesity (BMI 30-39.9) 05/15/2013  . Globus sensation 12/01/2012  . GERD (gastroesophageal reflux disease) 11/10/2012  . Joint pain   . Obesity   . Heart palpitations   . HYPERGLYCEMIA 05/08/2010  . Anxiety state 02/11/2009  . Essential hypertension 02/11/2009   Madelyn Flavors PT  12/10/2015, 10:47 AM  [facility] [address] [place], [place], [address] Phone: [phone]   Fax:  [phone]  Name: Randall Lara MRN: [id number] Date of Birth: 1930/04/11

## 2015-12-12 ENCOUNTER — Encounter: Payer: Medicare Other | Admitting: Physical Therapy

## 2015-12-16 ENCOUNTER — Ambulatory Visit: Payer: Medicare Other | Attending: Orthopedic Surgery | Admitting: Physical Therapy

## 2015-12-16 ENCOUNTER — Encounter: Payer: Self-pay | Admitting: Physical Therapy

## 2015-12-16 DIAGNOSIS — R600 Localized edema: Secondary | ICD-10-CM | POA: Insufficient documentation

## 2015-12-16 DIAGNOSIS — M25561 Pain in right knee: Secondary | ICD-10-CM | POA: Diagnosis not present

## 2015-12-16 DIAGNOSIS — M25661 Stiffness of right knee, not elsewhere classified: Secondary | ICD-10-CM | POA: Diagnosis not present

## 2015-12-16 NOTE — Therapy (Signed)
[facility] [place], [place], [address] Phone: [phone]   Fax:  [phone]  Physical Therapy Treatment  Patient Details  Name: Randall Lara MRN: [id number] Date of Birth: [birth year] Referring Provider: Latanya Maudlin, MD  Encounter Date: 12/16/2015      PT End of Session - 12/16/15 1014    Visit Number 4   Number of Visits 12   Date for PT Re-Evaluation 01/14/16   PT Start Time 0945   PT Stop Time [id number]   PT Time Calculation (min) 59 min   Activity Tolerance Patient tolerated treatment well   Behavior During Therapy [facility] for tasks assessed/performed      Past Medical History  Diagnosis Date  . Atrial fibrillation ([place])   . Joint pain   . Obesity   . Hypertension   . Anxiety   . Heart palpitations   . Dysrhythmia     atrial fib  . Depression   . GERD (gastroesophageal reflux disease)     no meds needed- diet controlled    Past Surgical History  Procedure Laterality Date  . Esophagogastroduodenoscopy  03/29/2002    [id number] esophagus, stomach, and duodenum through the second  portion.  Marland Kitchen Hernia repair    . Total knee arthroplasty Right 11/13/2015    Procedure: TOTAL KNEE ARTHROPLASTY;  Surgeon: Latanya Maudlin, MD;  Location: [facility];  Service: Orthopedics;  Laterality: Right;    There were no vitals filed for this visit.      Subjective Assessment - 12/16/15 1007    Subjective Patient has ongoing stiffness   Patient is accompained by: Family member   Pertinent History HTN, Afib   Patient Stated Goals Decrease pain increase movement   Currently in Pain? Yes   Pain Score 4    Pain Location Knee   Pain Orientation Right   Pain Descriptors / Indicators Tightness;Sore   Pain Type Surgical pain   Pain Onset More than a month ago   Pain Frequency Constant   Aggravating Factors  ROM   Pain Relieving Factors rest and meds            [facility] PT Assessment - 12/16/15 0001    AROM   AROM Assessment  Site Knee   Right/Left Knee Right   Right Knee Extension -11   Right Knee Flexion 94   PROM   PROM Assessment Site Knee   Right/Left Knee Right   Right Knee Extension -8   Right Knee Flexion 108                     [facility] Adult PT Treatment/Exercise - 12/16/15 0001    Knee/Hip Exercises: Aerobic   Nustep L4, seat 8 to 7 x 15 min   Knee/Hip Exercises: Standing   Forward Step Up Right;2 sets;10 reps;Hand Hold: 2;Step Height: 6"   Rocker Board 2 minutes   Acupuncturist Location R knee   Electrical Stimulation Action IFC   Electrical Stimulation Parameters 1-10hz    Electrical Stimulation Goals Edema;Pain   Vasopneumatic   Number Minutes Vasopneumatic  15 minutes   Vasopnuematic Location  Knee   Vasopneumatic Pressure Medium   Manual Therapy   Manual Therapy Passive ROM   Passive ROM PROM to R knee into flex/ext with long holds into extension                     PT Long Term Goals - 12/16/15 1015  PT LONG TERM GOAL #1   Title I with HEP   Time 6   Period Weeks   Status On-going   PT LONG TERM GOAL #2   Title improved R knee ROM -2 to 120 degrees to normalize gait.   Time 6   Period Weeks   Status On-going   PT LONG TERM GOAL #3   Title Patient able to perform ADLs with 2/10 or less.   Time 6   Period Weeks   Status On-going   PT LONG TERM GOAL #4   Title Patient to demo 5/5 R knee strength.   Time 6   Period Weeks   Status On-going               Plan - 12/16/15 1027    Clinical Impression Statement Patient progressing with all activities and tolerated treatment very well. Patient reported doing exercises and stretches daily. Patient has improved with ROM in right knee and has little pain ovrall. Goals ongoing at this time due to full ROM, strength and  pain deficits.   Rehab Potential Excellent   PT Frequency 3x / week   PT Duration 6 weeks   PT Treatment/Interventions ADLs/Self Care Home  Management;Electrical Stimulation;Cryotherapy;Therapeutic exercise;Neuromuscular re-education;Patient/family education;Manual techniques;Vasopneumatic Device;Passive range of motion;Scar mobilization   PT Next Visit Plan cont with TKA protocol, modalities for pain; scar massage, Manual to R ITB, estim for quad strength if indicated. (MD. Gladstone Lighter 12/17/15)   Consulted and Agree with Plan of Care Patient;Family member/caregiver      Patient will benefit from skilled therapeutic intervention in order to improve the following deficits and impairments:  Abnormal gait, Decreased range of motion, Pain, Decreased scar mobility, Increased edema, Decreased strength  Visit Diagnosis: Pain in right knee  Stiffness of right knee, not elsewhere classified  Localized edema     Problem List Patient Active Problem List   Diagnosis Date Noted  . History of total knee arthroplasty 11/13/2015  . Aortic valve disease 08/23/2014  . Osteoarthritis of both knees 08/23/2014  . History of atrial fibrillation 05/15/2014  . Prediabetes 11/14/2013  . Chest pain 06/16/2013  . Obesity (BMI 30-39.9) 05/15/2013  . Globus sensation 12/01/2012  . GERD (gastroesophageal reflux disease) 11/10/2012  . Joint pain   . Obesity   . Heart palpitations   . HYPERGLYCEMIA 05/08/2010  . Anxiety state 02/11/2009  . Essential hypertension 02/11/2009    APPLEGATE, [place], PTA 12/16/2015, 1:09 PM  Ladean Raya, PTA 12/16/2015 1:09 PM Mali Applegate MPT [facility] [place], [place], [address] Phone: [phone]   Fax:  [phone]  Name: Randall Lara MRN: [id number] Date of Birth: December 30, 1929

## 2015-12-18 ENCOUNTER — Encounter: Payer: Self-pay | Admitting: Physical Therapy

## 2015-12-18 ENCOUNTER — Ambulatory Visit: Payer: Medicare Other | Admitting: Physical Therapy

## 2015-12-18 DIAGNOSIS — M25561 Pain in right knee: Secondary | ICD-10-CM

## 2015-12-18 DIAGNOSIS — M25661 Stiffness of right knee, not elsewhere classified: Secondary | ICD-10-CM

## 2015-12-18 DIAGNOSIS — R600 Localized edema: Secondary | ICD-10-CM | POA: Diagnosis not present

## 2015-12-18 NOTE — Therapy (Signed)
[facility] [place], [place], [address] Phone: [phone]   Fax:  [phone]  Physical Therapy Treatment  Patient Details  Name: Randall Lara MRN: [id number] Date of Birth: [birth year] Referring Provider: Latanya Maudlin, MD  Encounter Date: 12/18/2015      PT End of Session - 12/18/15 0955    Visit Number 5   Number of Visits 12   Date for PT Re-Evaluation 01/14/16   PT Start Time 0946   PT Stop Time 1045   PT Time Calculation (min) 59 min   Activity Tolerance Patient tolerated treatment well   Behavior During Therapy [facility] for tasks assessed/performed      Past Medical History  Diagnosis Date  . Atrial fibrillation ([place])   . Joint pain   . Obesity   . Hypertension   . Anxiety   . Heart palpitations   . Dysrhythmia     atrial fib  . Depression   . GERD (gastroesophageal reflux disease)     no meds needed- diet controlled    Past Surgical History  Procedure Laterality Date  . Esophagogastroduodenoscopy  03/29/2002    [id number] esophagus, stomach, and duodenum through the second  portion.  Marland Kitchen Hernia repair    . Total knee arthroplasty Right 11/13/2015    Procedure: TOTAL KNEE ARTHROPLASTY;  Surgeon: Latanya Maudlin, MD;  Location: [facility];  Service: Orthopedics;  Laterality: Right;    There were no vitals filed for this visit.      Subjective Assessment - 12/18/15 0952    Subjective Patient went to MD and is to continue 3x a week for 2 more weeks and progress toi HEP per MD   Patient is accompained by: Family member   Pertinent History HTN, Afib   Patient Stated Goals Decrease pain increase movement   Currently in Pain? Yes   Pain Score 4    Pain Location Knee   Pain Orientation Right   Pain Descriptors / Indicators Sore;Tightness   Pain Type Surgical pain   Pain Onset More than a month ago   Pain Frequency Constant   Aggravating Factors  ROM   Pain Relieving Factors rest and meds             [facility] PT Assessment - 12/18/15 0001    AROM   AROM Assessment Site Knee   Right/Left Knee Right   Right Knee Extension -10   Right Knee Flexion 106   PROM   PROM Assessment Site Knee   Right/Left Knee Right   Right Knee Extension -6   Right Knee Flexion 113                     [facility] Adult PT Treatment/Exercise - 12/18/15 0001    Knee/Hip Exercises: Aerobic   Nustep L4, seat 8 to 7 x 15 min   Electrical Stimulation   Electrical Stimulation Location R knee   Electrical Stimulation Action IFC   Electrical Stimulation Parameters 1-10hz    Electrical Stimulation Goals Edema;Pain   Vasopneumatic   Number Minutes Vasopneumatic  15 minutes   Vasopnuematic Location  Knee   Vasopneumatic Pressure Medium   Manual Therapy   Manual Therapy Passive ROM   Passive ROM PROM to R knee into flex/ext with long holds into extension                     PT Long Term Goals - 12/16/15 1015    PT LONG TERM  GOAL #1   Title I with HEP   Time 6   Period Weeks   Status On-going   PT LONG TERM GOAL #2   Title improved R knee ROM -2 to 120 degrees to normalize gait.   Time 6   Period Weeks   Status On-going   PT LONG TERM GOAL #3   Title Patient able to perform ADLs with 2/10 or less.   Time 6   Period Weeks   Status On-going   PT LONG TERM GOAL #4   Title Patient to demo 5/5 R knee strength.   Time 6   Period Weeks   Status On-going               Plan - 12/18/15 1027    Clinical Impression Statement Patient progressing very well today with improved ROM in right knee for both flexion and ext. Patient and wife were educated on continued stretching and strengthening at home. Focued on manual stretching today to improve ROM and functional independence. Patient goals ongoing due to full ROM, strength and pain deficits   Rehab Potential Excellent   PT Frequency 3x / week   PT Duration 6 weeks   PT Treatment/Interventions ADLs/Self Care Home  Management;Electrical Stimulation;Cryotherapy;Therapeutic exercise;Neuromuscular re-education;Patient/family education;Manual techniques;Vasopneumatic Device;Passive range of motion;Scar mobilization   PT Next Visit Plan cont with TKA protocol, modalities for pain; scar massage, Manual to R ITB, estim for quad strength if indicated/ DC after next week per Gio[place]re   Consulted and Agree with Plan of Care Patient      Patient will benefit from skilled therapeutic intervention in order to improve the following deficits and impairments:  Abnormal gait, Decreased range of motion, Pain, Decreased scar mobility, Increased edema, Decreased strength  Visit Diagnosis: Pain in right knee  Stiffness of right knee, not elsewhere classified  Localized edema     Problem List Patient Active Problem List   Diagnosis Date Noted  . History of total knee arthroplasty 11/13/2015  . Aortic valve disease 08/23/2014  . Osteoarthritis of both knees 08/23/2014  . History of atrial fibrillation 05/15/2014  . Prediabetes 11/14/2013  . Chest pain 06/16/2013  . Obesity (BMI 30-39.9) 05/15/2013  . Globus sensation 12/01/2012  . GERD (gastroesophageal reflux disease) 11/10/2012  . Joint pain   . Obesity   . Heart palpitations   . HYPERGLYCEMIA 05/08/2010  . Anxiety state 02/11/2009  . Essential hypertension 02/11/2009    Phillips Climes, PTA  12/18/2015, 10:45 AM  [facility] [place], [place], [address] Phone: [phone]   Fax:  [phone]  Name: Randall Lara MRN: [id number] Date of Birth: 07/18/1929

## 2015-12-20 ENCOUNTER — Ambulatory Visit: Payer: Medicare Other | Admitting: Physical Therapy

## 2015-12-20 ENCOUNTER — Encounter: Payer: Self-pay | Admitting: Physical Therapy

## 2015-12-20 DIAGNOSIS — M25561 Pain in right knee: Secondary | ICD-10-CM | POA: Diagnosis not present

## 2015-12-20 DIAGNOSIS — M25661 Stiffness of right knee, not elsewhere classified: Secondary | ICD-10-CM

## 2015-12-20 DIAGNOSIS — R600 Localized edema: Secondary | ICD-10-CM

## 2015-12-20 NOTE — Therapy (Signed)
[facility] [place], [place], [address] Phone: [phone]   Fax:  [phone]  Physical Therapy Treatment  Patient Details  Name: Randall Lara MRN: [id number] Date of Birth: 05-Jul-1930 Referring Provider: Latanya Maudlin, MD  Encounter Date: 12/20/2015      PT End of Session - 12/20/15 0938    Visit Number 6   Number of Visits 12   Date for PT Re-Evaluation 01/14/16   PT Start Time 0945   PT Stop Time 1035   PT Time Calculation (min) 50 min   Activity Tolerance Patient tolerated treatment well   Behavior During Therapy [facility] for tasks assessed/performed      Past Medical History  Diagnosis Date  . Atrial fibrillation ([place])   . Joint pain   . Obesity   . Hypertension   . Anxiety   . Heart palpitations   . Dysrhythmia     atrial fib  . Depression   . GERD (gastroesophageal reflux disease)     no meds needed- diet controlled    Past Surgical History  Procedure Laterality Date  . Esophagogastroduodenoscopy  03/29/2002    [id number] esophagus, stomach, and duodenum through the second  portion.  Marland Kitchen Hernia repair    . Total knee arthroplasty Right 11/13/2015    Procedure: TOTAL KNEE ARTHROPLASTY;  Surgeon: Latanya Maudlin, MD;  Location: [facility];  Service: Orthopedics;  Laterality: Right;    There were no vitals filed for this visit.      Subjective Assessment - 12/20/15 0937    Subjective Reports that knee is pretty good and that his pain is all the time. States that he was on his feet a lot more yesterday. Patient and wife both inquire regarding HEP and activity.   Patient is accompained by: Family member   Pertinent History HTN, Afib   Patient Stated Goals Decrease pain increase movement   Currently in Pain? Yes   Pain Score 2    Pain Location Knee   Pain Orientation Right   Pain Descriptors / Indicators Sore   Pain Type Surgical pain   Pain Onset More than a month ago            [facility] PT Assessment  - 12/20/15 0001    Assessment   Medical Diagnosis Aftercare R TKR   Onset Date/Surgical Date 11/13/15   Prior Therapy nursing home x 10 days until 11/27/15   ROM / Strength   AROM / PROM / Strength AROM   AROM   Overall AROM  Deficits   AROM Assessment Site Knee   Right/Left Knee Right   Right Knee Extension -8   Right Knee Flexion 110                     [facility] Adult PT Treatment/Exercise - 12/20/15 0001    Knee/Hip Exercises: Aerobic   Nustep L6, seat 13 x16 min   Knee/Hip Exercises: Standing   Forward Lunges Right;2 sets;10 reps;3 seconds   Forward Step Up Right;3 sets;10 reps;Hand Hold: 2;Step Height: 6"   Rocker Board 3 minutes   Knee/Hip Exercises: Seated   Long Arc Quad Strengthening;Right;2 sets;10 reps;Weights   Long Arc Quad Weight 4 lbs.   Modalities   Modalities Science writer Action IFC   Electrical Stimulation Parameters 1-10 hz x15 min   Electrical Stimulation Goals Edema;Pain   Vasopneumatic   Number  Minutes Vasopneumatic  15 minutes   Vasopnuematic Location  Knee   Vasopneumatic Pressure Medium   Vasopneumatic Temperature  48                     PT Long Term Goals - 12/20/15 [id number]    PT LONG TERM GOAL #1   Title I with HEP   Time 6   Period Weeks   Status Achieved   PT LONG TERM GOAL #2   Title improved R knee ROM -2 to 120 degrees to normalize gait.   Time 6   Period Weeks   Status On-going  AROM R knee 8-110 deg 12/20/2015   PT LONG TERM GOAL #3   Title Patient able to perform ADLs with 2/10 or less.   Time 6   Period Weeks   Status On-going   PT LONG TERM GOAL #4   Title Patient to demo 5/5 R knee strength.   Time 6   Period Weeks   Status On-going               Plan - 12/20/15 1021    Clinical Impression Statement Patient continues to progress with exercises and with ROM today with wife  present. Patient and wife were both inquired about HEP and activity to which patient and wife were both encouraged to continue HEP as directed and as much as they can and to be as active as he can. Patient and wife were educated that for some time following surgery he may experience soreness and swelling with activity but to keep acitivtty level up to keep knee mobile. Patient completed all exercises fairly well as he required continued mutlimodal cueing for exercise technique and corrections. AROM R Knee measured as 8-110 deg today in supine today. Normal modalities response noted following removal of the modalities. Patient's wife educated that it would be okay to apply vitamin e lotion to both sides of knee to relieve itching that she says patient reports at home. Patient's wife states that she wouldn't apply directly over scar as she is skeptical. Patient denied pain and reported dizziness upon sitting from modalities.   Rehab Potential Excellent   PT Frequency 3x / week   PT Duration 6 weeks   PT Treatment/Interventions ADLs/Self Care Home Management;Electrical Stimulation;Cryotherapy;Therapeutic exercise;Neuromuscular re-education;Patient/family education;Manual techniques;Vasopneumatic Device;Passive range of motion;Scar mobilization   PT Next Visit Plan cont with TKA protocol, modalities for pain; scar massage, Manual to R ITB, estim for quad strength if indicated/ DC after next week per [facility]   PT Home Exercise Plan flex/ext stretches; QS, heel slides, LAQ, seated marching      Patient will benefit from skilled therapeutic intervention in order to improve the following deficits and impairments:  Abnormal gait, Decreased range of motion, Pain, Decreased scar mobility, Increased edema, Decreased strength  Visit Diagnosis: Pain in right knee  Stiffness of right knee, not elsewhere classified  Localized edema     Problem List Patient Active Problem List   Diagnosis Date Noted  .  History of total knee arthroplasty 11/13/2015  . Aortic valve disease 08/23/2014  . Osteoarthritis of both knees 08/23/2014  . History of atrial fibrillation 05/15/2014  . Prediabetes 11/14/2013  . Chest pain 06/16/2013  . Obesity (BMI 30-39.9) 05/15/2013  . Globus sensation 12/01/2012  . GERD (gastroesophageal reflux disease) 11/10/2012  . Joint pain   . Obesity   . Heart palpitations   . HYPERGLYCEMIA 05/08/2010  . Anxiety state 02/11/2009  . Essential  hypertension 02/11/2009    Wynelle Fanny, PTA 12/20/2015, 10:39 AM  [facility] [address] [place], [place], [address] Phone: [phone]   Fax:  [phone]  Name: PRANIT SOKOL MRN: [id number] Date of Birth: 10-Sep-1929

## 2015-12-23 ENCOUNTER — Ambulatory Visit: Payer: Medicare Other | Admitting: Physical Therapy

## 2015-12-23 ENCOUNTER — Encounter: Payer: Self-pay | Admitting: Physical Therapy

## 2015-12-23 DIAGNOSIS — M25561 Pain in right knee: Secondary | ICD-10-CM | POA: Diagnosis not present

## 2015-12-23 DIAGNOSIS — M25661 Stiffness of right knee, not elsewhere classified: Secondary | ICD-10-CM | POA: Diagnosis not present

## 2015-12-23 DIAGNOSIS — R600 Localized edema: Secondary | ICD-10-CM

## 2015-12-23 NOTE — Therapy (Signed)
[facility] [place], [place], [address] Phone: [phone]   Fax:  [phone]  Physical Therapy Treatment  Patient Details  Name: Randall Lara MRN: [id number] Date of Birth: 1930/05/25 Referring Provider: Latanya Maudlin, MD  Encounter Date: 12/23/2015      PT End of Session - 12/23/15 1032    Visit Number 7   Number of Visits 12   PT Start Time 0944   PT Stop Time 1044   PT Time Calculation (min) 60 min   Activity Tolerance Patient tolerated treatment well   Behavior During Therapy [facility] for tasks assessed/performed      Past Medical History  Diagnosis Date  . Atrial fibrillation ([place])   . Joint pain   . Obesity   . Hypertension   . Anxiety   . Heart palpitations   . Dysrhythmia     atrial fib  . Depression   . GERD (gastroesophageal reflux disease)     no meds needed- diet controlled    Past Surgical History  Procedure Laterality Date  . Esophagogastroduodenoscopy  03/29/2002    [id number] esophagus, stomach, and duodenum through the second  portion.  Marland Kitchen Hernia repair    . Total knee arthroplasty Right 11/13/2015    Procedure: TOTAL KNEE ARTHROPLASTY;  Surgeon: Latanya Maudlin, MD;  Location: [facility];  Service: Orthopedics;  Laterality: Right;    There were no vitals filed for this visit.      Subjective Assessment - 12/23/15 0953    Subjective Patient has reported feeling better overall   Patient is accompained by: Family member   Pertinent History HTN, Afib   Patient Stated Goals Decrease pain increase movement   Currently in Pain? Yes   Pain Score 2    Pain Location Knee   Pain Orientation Right   Pain Descriptors / Indicators Sore   Pain Type Surgical pain   Pain Onset More than a month ago   Pain Frequency Constant   Aggravating Factors  ROM   Pain Relieving Factors rest            [facility] PT Assessment - 12/23/15 0001    AROM   Overall AROM  Deficits   AROM Assessment Site Knee    Right/Left Knee Right   Right Knee Extension -10   Right Knee Flexion 110   PROM   PROM Assessment Site Knee   Right/Left Knee Right   Right Knee Extension -5   Right Knee Flexion 115                     [facility] Adult PT Treatment/Exercise - 12/23/15 0001    Knee/Hip Exercises: Stretches   Knee: Self-Stretch to increase Flexion Right;3 reps;30 seconds   Knee/Hip Exercises: Aerobic   Nustep L6, seat 10 x15 min   Knee/Hip Exercises: Standing   Forward Step Up Right;3 sets;10 reps;Hand Hold: 2;Step Height: 6"   Rocker Board 3 minutes   Acupuncturist Location R knee   Electrical Stimulation Action IFC   Electrical Stimulation Parameters 1-10hz    Electrical Stimulation Goals Edema;Pain   Vasopneumatic   Number Minutes Vasopneumatic  15 minutes   Vasopnuematic Location  Knee   Vasopneumatic Pressure Medium   Manual Therapy   Manual Therapy Passive ROM   Passive ROM PROM to R knee into flex/ext with long holds into extension  PT Long Term Goals - 12/20/15 [id number]    PT LONG TERM GOAL #1   Title I with HEP   Time 6   Period Weeks   Status Achieved   PT LONG TERM GOAL #2   Title improved R knee ROM -2 to 120 degrees to normalize gait.   Time 6   Period Weeks   Status On-going  AROM R knee 8-110 deg 12/20/2015   PT LONG TERM GOAL #3   Title Patient able to perform ADLs with 2/10 or less.   Time 6   Period Weeks   Status On-going   PT LONG TERM GOAL #4   Title Patient to demo 5/5 R knee strength.   Time 6   Period Weeks   Status On-going               Plan - 12/23/15 1034    Clinical Impression Statement Patient progressing overall and feels like therapy is helping. Patient is improving with ROM in right knee today. Patient and wife feel like he would bebifit from continued therapy and would like to continue another week or two as needed. MPT agrees to continue with POC and progress per  goals. Patient goals ongoing due to ROM, pain and strength deficits.   Rehab Potential Excellent   PT Frequency 3x / week   PT Duration 6 weeks   PT Treatment/Interventions ADLs/Self Care Home Management;Electrical Stimulation;Cryotherapy;Therapeutic exercise;Neuromuscular re-education;Patient/family education;Manual techniques;Vasopneumatic Device;Passive range of motion;Scar mobilization   PT Next Visit Plan cont with TKA protocol, modalities for pain; scar massage, Manual to R ITB, estim for quad strength    Consulted and Agree with Plan of Care Patient;Family member/caregiver      Patient will benefit from skilled therapeutic intervention in order to improve the following deficits and impairments:  Abnormal gait, Decreased range of motion, Pain, Decreased scar mobility, Increased edema, Decreased strength  Visit Diagnosis: Pain in right knee  Stiffness of right knee, not elsewhere classified  Localized edema     Problem List Patient Active Problem List   Diagnosis Date Noted  . History of total knee arthroplasty 11/13/2015  . Aortic valve disease 08/23/2014  . Osteoarthritis of both knees 08/23/2014  . History of atrial fibrillation 05/15/2014  . Prediabetes 11/14/2013  . Chest pain 06/16/2013  . Obesity (BMI 30-39.9) 05/15/2013  . Globus sensation 12/01/2012  . GERD (gastroesophageal reflux disease) 11/10/2012  . Joint pain   . Obesity   . Heart palpitations   . HYPERGLYCEMIA 05/08/2010  . Anxiety state 02/11/2009  . Essential hypertension 02/11/2009    Randall Lara, PTA 12/23/2015, 10:45 AM  [facility] [place], [place], [address] Phone: [phone]   Fax:  [phone]  Name: Randall Lara MRN: [id number] Date of Birth: 25-Jun-1930

## 2015-12-24 ENCOUNTER — Ambulatory Visit (INDEPENDENT_AMBULATORY_CARE_PROVIDER_SITE_OTHER): Payer: Medicare Other | Admitting: Family Medicine

## 2015-12-24 ENCOUNTER — Encounter: Payer: Self-pay | Admitting: Family Medicine

## 2015-12-24 VITALS — BP 110/64 | HR 82 | Temp 98.0°F | Ht 72.0 in | Wt 203.0 lb

## 2015-12-24 DIAGNOSIS — I10 Essential (primary) hypertension: Secondary | ICD-10-CM

## 2015-12-24 DIAGNOSIS — F411 Generalized anxiety disorder: Secondary | ICD-10-CM

## 2015-12-24 DIAGNOSIS — Z8679 Personal history of other diseases of the circulatory system: Secondary | ICD-10-CM

## 2015-12-24 MED ORDER — ALPRAZOLAM 1 MG PO TABS: 1.0000 mg | ORAL_TABLET | Freq: Three times a day (TID) | ORAL | Status: DC | PRN

## 2015-12-24 NOTE — Progress Notes (Signed)
Subjective:    Patient ID: Randall Lara, male    DOB: [birth year], 82 y.o.   MRN: [id number]  HPI Patient is here for routine six-month follow-up.  Chronic anxiety. Patient states that he has taken Xanax for approximately 40 years. We had a long discussion recently regarding our concern and trying to taper him back if possible because of his age and association with potential increased risk for falls. He is been very distraught over the fact that we're trying to reduce his dose from 1 mg 3 times a day to twice a day. We had added low-dose Lexapro 10 mg daily in hopes that we could taper him off the Xanax. He states he has been extremely anxious since reducing this dosage. We have had extremely long discussion of the past regarding adverse risk with benzodiazepines especially with his edema thing age. He is adamant that he would like to go back to 1 mg 3 times a day and he states he had done well in that dosage for many years. He does not use alcohol. He is currently not using any pain medication. He did have recent right total knee replacement is recovering well from that  Hypertension treated with benazepril HCTZ. Blood pressure stable. No headaches. No dizziness. No chest pains.  History of atrial fibrillation. He is on chronic anticoagulation with Eliquis. He states he is compliant taking that.  Past Medical History  Diagnosis Date  . Atrial fibrillation ([place])   . Joint pain   . Obesity   . Hypertension   . Anxiety   . Heart palpitations   . Dysrhythmia     atrial fib  . Depression   . GERD (gastroesophageal reflux disease)     no meds needed- diet controlled   Past Surgical History  Procedure Laterality Date  . Esophagogastroduodenoscopy  03/29/2002    [id number] esophagus, stomach, and duodenum through the second  portion.  Marland Kitchen Hernia repair    . Total knee arthroplasty Right 11/13/2015    Procedure: TOTAL KNEE ARTHROPLASTY;  Surgeon: Latanya Maudlin, MD;  Location: [facility];   Service: Orthopedics;  Laterality: Right;    reports that he quit smoking about 40 years ago. His smoking use included Cigarettes. He started smoking about 60 years ago. He has a 20 pack-year smoking history. He has never used smokeless tobacco. He reports that he does not drink alcohol or use illicit drugs. family history includes Heart attack in his brother; Hypertension in his mother and sister; Stroke in his father, mother, and sister. There is no history of Colon cancer. Allergies  Allergen Reactions  . Ferrous Sulfate     REACTION: swelling, hives. Doesn't recall.  . Sulfa Antibiotics Hives and Swelling      Review of Systems  Constitutional: Negative for fatigue and unexpected weight change.  Eyes: Negative for visual disturbance.  Respiratory: Negative for cough, chest tightness and shortness of breath.   Cardiovascular: Negative for chest pain, palpitations and leg swelling.  Gastrointestinal: Negative for abdominal pain.  Genitourinary: Negative for dysuria.  Neurological: Negative for dizziness, syncope, weakness, light-headedness and headaches.  Psychiatric/Behavioral: Positive for sleep disturbance. Negative for suicidal ideas and confusion. The patient is nervous/anxious.        Objective:   Physical Exam  Constitutional: He is oriented to person, place, and time. He appears well-developed and well-nourished. No distress.  Neck: Neck supple. No thyromegaly present.  Cardiovascular: Normal rate.   Pulmonary/Chest: Effort normal and breath sounds normal. No respiratory  distress. He has no wheezes. He has no rales.  Musculoskeletal:  He has some expected mild edema of the right knee and mild warmth following recent total right knee replacement about 6 weeks ago. Nontender  Neurological: He is alert and oriented to person, place, and time.  Psychiatric:  Alert and cooperative but somewhat anxious appearing which is near his baseline          Assessment & Plan:  #1  chronic anxiety. He has not responded much to low-dose Lexapro. We're trying to taper him back to lower doses of alprazolam but he has not done well with this. We had a long discussion with him and his wife about potential adverse issues with benzodiazepine use especially with his advancing age. He is adamant that he would like to go back to 1 mg 3 times a day. New prescription is written. We have again reviewed potential adverse side effects.  #2 hypertension stable and at goal. Continue benazepril HCTZ  #3 chronic atrial fibrillation. Continue chronic anticoagulation. He has follow-up with cardiology in August and states he has had lipids checked per his cardiologist in the past. If they do not address this in August will get follow-up in 6 months  [name] MD [facility]

## 2015-12-24 NOTE — Progress Notes (Signed)
Pre visit review using our clinic review tool, if applicable. No additional management support is needed unless otherwise documented below in the visit note. 

## 2015-12-25 ENCOUNTER — Ambulatory Visit: Payer: Medicare Other | Admitting: Physical Therapy

## 2015-12-25 ENCOUNTER — Encounter: Payer: Self-pay | Admitting: Physical Therapy

## 2015-12-25 DIAGNOSIS — M25561 Pain in right knee: Secondary | ICD-10-CM

## 2015-12-25 DIAGNOSIS — M25661 Stiffness of right knee, not elsewhere classified: Secondary | ICD-10-CM | POA: Diagnosis not present

## 2015-12-25 DIAGNOSIS — R600 Localized edema: Secondary | ICD-10-CM | POA: Diagnosis not present

## 2015-12-25 NOTE — Therapy (Addendum)
[facility] [place], [place], [address] Phone: [phone]   Fax:  [phone]  Physical Therapy Treatment  Patient Details  Name: Randall Lara MRN: [id number] Date of Birth: 01-18-1930 Referring Provider: Latanya Maudlin, MD  Encounter Date: 12/25/2015      PT End of Session - 12/25/15 0920    Visit Number 8   Number of Visits 12   Date for PT Re-Evaluation 01/14/16   PT Start Time 0946   PT Stop Time 1030   PT Time Calculation (min) 44 min   Activity Tolerance Patient tolerated treatment well   Behavior During Therapy [facility] for tasks assessed/performed      Past Medical History  Diagnosis Date  . Atrial fibrillation ([place])   . Joint pain   . Obesity   . Hypertension   . Anxiety   . Heart palpitations   . Dysrhythmia     atrial fib  . Depression   . GERD (gastroesophageal reflux disease)     no meds needed- diet controlled    Past Surgical History  Procedure Laterality Date  . Esophagogastroduodenoscopy  03/29/2002    [id number] esophagus, stomach, and duodenum through the second  portion.  Marland Kitchen Hernia repair    . Total knee arthroplasty Right 11/13/2015    Procedure: TOTAL KNEE ARTHROPLASTY;  Surgeon: Latanya Maudlin, MD;  Location: [facility];  Service: Orthopedics;  Laterality: Right;    There were no vitals filed for this visit.      Subjective Assessment - 12/25/15 0920    Subjective Reports he is in good shape today and reports that his wife didn't come with him today.   Pertinent History HTN, Afib   Patient Stated Goals Decrease pain increase movement   Currently in Pain? No/denies            [facility] PT Assessment - 12/25/15 0001    Assessment   Medical Diagnosis Aftercare R TKR   Onset Date/Surgical Date 11/13/15   Prior Therapy nursing home x 10 days until 11/27/15                     [facility] Adult PT Treatment/Exercise - 12/25/15 0001    Knee/Hip Exercises: Aerobic   Nustep L7,  seat 10 x15 min   Knee/Hip Exercises: Standing   Forward Lunges Right;2 sets;10 reps;3 seconds  8" step   Forward Step Up Right;2 sets;5 reps;Hand Hold: 2;Step Height: 6"   Rocker Board 3 minutes   Knee/Hip Exercises: Seated   Long Arc Quad Strengthening;Right;2 sets;10 reps;Weights   Long Arc Quad Weight 4 lbs.   Modalities   Modalities Associate Professor IFC   Electrical Stimulation Parameters 1-10 hz x10 min   Electrical Stimulation Goals Edema;Pain   Vasopneumatic   Number Minutes Vasopneumatic  10 minutes   Vasopnuematic Location  Knee   Vasopneumatic Pressure Medium   Vasopneumatic Temperature  34   Manual Therapy   Manual Therapy Passive ROM;Soft tissue mobilization   Soft tissue mobilization R patellar mobilizations in L/R, sup/inf directions to promote proper mobility   Passive ROM PROM to R knee into ext with long holds into extension                     PT Long Term Goals - 12/25/15 1025    PT LONG TERM GOAL #1   Title I with  HEP   Time 6   Period Weeks   Status Achieved   PT LONG TERM GOAL #2   Title improved R knee ROM -2 to 120 degrees to normalize gait.   Time 6   Period Weeks   Status On-going  AROM R knee 8-110 deg 12/20/2015   PT LONG TERM GOAL #3   Title Patient able to perform ADLs with 2/10 or less.   Time 6   Period Weeks   Status Achieved   PT LONG TERM GOAL #4   Title Patient to demo 5/5 R knee strength.   Time 6   Period Weeks   Status On-going               Plan - 12/25/15 1021    Clinical Impression Statement Patient tolerated today's treatment well overall with greatest complaint of discomfort being during PROM of R knee into extension in supine. Patient able to complete all exercises with moderate mutlimodal cueing for proper exercise technique. Patient educated that he still needs to complete his HEP  at home as he tries to avoid completing HEP and has been educated on this issue in prior treatments. AROM of R knee extension was measured as 3 deg from neutral position today. Good R patella mobility noted today  and only very minimal edema noted around R knee today. Patient able to achieve LT goal regardng pain with ADLs.Patient noted that today was able to feel stimulation along lateral R knee that he had been able to feel before. Normal modalities response noted following removal of the modalities. Patient reported a burning sensation in lateral R knee and modalities were disconfinued and no redness was noted.   Rehab Potential Excellent   PT Frequency 3x / week   PT Duration 6 weeks   PT Treatment/Interventions ADLs/Self Care Home Management;Electrical Stimulation;Cryotherapy;Therapeutic exercise;Neuromuscular re-education;Patient/family education;Manual techniques;Vasopneumatic Device;Passive range of motion;Scar mobilization   PT Next Visit Plan Continue per TKR protocol with emphasis on ROM and strengthening per MPT POC.   PT Home Exercise Plan flex/ext stretches; QS, heel slides, LAQ, seated marching   Consulted and Agree with Plan of Care Patient      Patient will benefit from skilled therapeutic intervention in order to improve the following deficits and impairments:  Abnormal gait, Decreased range of motion, Pain, Decreased scar mobility, Increased edema, Decreased strength  Visit Diagnosis: Pain in right knee  Stiffness of right knee, not elsewhere classified  Localized edema     Problem List Patient Active Problem List   Diagnosis Date Noted  . History of total knee arthroplasty 11/13/2015  . Aortic valve disease 08/23/2014  . Osteoarthritis of both knees 08/23/2014  . History of atrial fibrillation 05/15/2014  . Prediabetes 11/14/2013  . Chest pain 06/16/2013  . Obesity (BMI 30-39.9) 05/15/2013  . Globus sensation 12/01/2012  . GERD (gastroesophageal reflux disease)  11/10/2012  . Joint pain   . Obesity   . Heart palpitations   . HYPERGLYCEMIA 05/08/2010  . Anxiety state 02/11/2009  . Essential hypertension 02/11/2009    Wynelle Fanny, PTA 12/25/2015, 10:42 AM  [facility] [address] [place], [place], [address] Phone: [phone]   Fax:  [phone]  Name: Randall Lara MRN: [id number] Date of Birth: 21-Apr-1930

## 2015-12-27 ENCOUNTER — Ambulatory Visit: Payer: Medicare Other | Admitting: *Deleted

## 2015-12-27 DIAGNOSIS — M25661 Stiffness of right knee, not elsewhere classified: Secondary | ICD-10-CM | POA: Diagnosis not present

## 2015-12-27 DIAGNOSIS — R600 Localized edema: Secondary | ICD-10-CM | POA: Diagnosis not present

## 2015-12-27 DIAGNOSIS — M25561 Pain in right knee: Secondary | ICD-10-CM

## 2015-12-27 NOTE — Therapy (Signed)
[facility] [place], [place], [address] Phone: [phone]   Fax:  [phone]  Physical Therapy Treatment  Patient Details  Name: Randall Lara MRN: [id number] Date of Birth: [birth year] Referring Provider: Latanya Maudlin, MD  Encounter Date: 12/27/2015      PT End of Session - 12/27/15 1005    Visit Number 9   Number of Visits 12   Date for PT Re-Evaluation 01/14/16   PT Start Time 0945   PT Stop Time 1048   PT Time Calculation (min) 63 min                                                                                                      Gcode next visit Past Medical History  Diagnosis Date  . Atrial fibrillation ([place])   . Joint pain   . Obesity   . Hypertension   . Anxiety   . Heart palpitations   . Dysrhythmia     atrial fib  . Depression   . GERD (gastroesophageal reflux disease)     no meds needed- diet controlled    Past Surgical History  Procedure Laterality Date  . Esophagogastroduodenoscopy  03/29/2002    [id number] esophagus, stomach, and duodenum through the second  portion.  Marland Kitchen Hernia repair    . Total knee arthroplasty Right 11/13/2015    Procedure: TOTAL KNEE ARTHROPLASTY;  Surgeon: Latanya Maudlin, MD;  Location: [facility];  Service: Orthopedics;  Laterality: Right;    There were no vitals filed for this visit.      Subjective Assessment - 12/27/15 1004    Subjective Reports he is in good shape today   Patient is accompained by: Family member   Pertinent History HTN, Afib   Patient Stated Goals Decrease pain increase movement   Currently in Pain? No/denies            [facility] PT Assessment - 12/27/15 0001    PROM   PROM Assessment Site Knee   Right/Left Knee Right   Right Knee Extension -5   Right Knee Flexion 115                     [facility] Adult PT Treatment/Exercise - 12/27/15 0001    Knee/Hip Exercises: Aerobic   Nustep L7, seat12,11,  10 x18 min for ROM progression    Knee/Hip Exercises: Standing   Forward Step Up Right;5 reps;Hand Hold: 2;Step Height: 6";3 sets;20 reps   Rocker Board 5 minutes  DF/PF calf stretching   Modalities   Modalities Electrical Stimulation;Vasopneumatic   Electrical Stimulation   Electrical Stimulation Location R knee IFC x 15 mins 1-10 hz   Electrical Stimulation Goals Edema;Pain   Vasopneumatic   Number Minutes Vasopneumatic  15 minutes   Vasopnuematic Location  Knee   Vasopneumatic Pressure Medium   Vasopneumatic Temperature  34   Manual Therapy   Manual Therapy Passive ROM;Soft tissue mobilization   Soft tissue mobilization STW to posterior aspect RT knee   Passive ROM PROM to R knee into ext with long holds  into extension                     PT Long Term Goals - 12/25/15 1025    PT LONG TERM GOAL #1   Title I with HEP   Time 6   Period Weeks   Status Achieved   PT LONG TERM GOAL #2   Title improved R knee ROM -2 to 120 degrees to normalize gait.   Time 6   Period Weeks   Status On-going  AROM R knee 8-110 deg 12/20/2015   PT LONG TERM GOAL #3   Title Patient able to perform ADLs with 2/10 or less.   Time 6   Period Weeks   Status Achieved   PT LONG TERM GOAL #4   Title Patient to demo 5/5 R knee strength.   Time 6   Period Weeks   Status On-going               Plan - 12/27/15 1051    Clinical Impression Statement Pt did fairly well with Rx today and was able to perform all exs and act.'s for RT LE with minimal discomfort. His main complaint was during PROM stretching for extension. His PROM today was about the same 5-115 degrees. He feels that RT LE continues to progreess and stairs are getting easier. tolerated modalities well today and had a normal response . unable to meet ROM goals today due to deficits   Rehab Potential Excellent   PT Frequency 3x / week   PT Duration 6 weeks   PT Treatment/Interventions ADLs/Self Care Home Management;Electrical  Stimulation;Cryotherapy;Therapeutic exercise;Neuromuscular re-education;Patient/family education;Manual techniques;Vasopneumatic Device;Passive range of motion;Scar mobilization   PT Next Visit Plan Continue per TKR protocol with emphasis on ROM and strengthening per MPT POC.   PT Home Exercise Plan flex/ext stretches; QS, heel slides, LAQ, seated marching   Consulted and Agree with Plan of Care Patient      Patient will benefit from skilled therapeutic intervention in order to improve the following deficits and impairments:  Abnormal gait, Decreased range of motion, Pain, Decreased scar mobility, Increased edema, Decreased strength  Visit Diagnosis: Pain in right knee  Stiffness of right knee, not elsewhere classified     Problem List Patient Active Problem List   Diagnosis Date Noted  . History of total knee arthroplasty 11/13/2015  . Aortic valve disease 08/23/2014  . Osteoarthritis of both knees 08/23/2014  . History of atrial fibrillation 05/15/2014  . Prediabetes 11/14/2013  . Chest pain 06/16/2013  . Obesity (BMI 30-39.9) 05/15/2013  . Globus sensation 12/01/2012  . GERD (gastroesophageal reflux disease) 11/10/2012  . Joint pain   . Obesity   . Heart palpitations   . HYPERGLYCEMIA 05/08/2010  . Anxiety state 02/11/2009  . Essential hypertension 02/11/2009    Jodel Mayhall,CHRIS, PTA 12/27/2015, 11:03 AM  [facility] [address] [place], [place], [address] Phone: [phone]   Fax:  [phone]  Name: BRICEN CLARKSON MRN: [id number] Date of Birth: 1929-11-03

## 2015-12-30 ENCOUNTER — Encounter: Payer: Self-pay | Admitting: Physical Therapy

## 2015-12-30 ENCOUNTER — Ambulatory Visit: Payer: Medicare Other | Admitting: Physical Therapy

## 2015-12-30 DIAGNOSIS — M25661 Stiffness of right knee, not elsewhere classified: Secondary | ICD-10-CM | POA: Diagnosis not present

## 2015-12-30 DIAGNOSIS — M25561 Pain in right knee: Secondary | ICD-10-CM | POA: Diagnosis not present

## 2015-12-30 DIAGNOSIS — R600 Localized edema: Secondary | ICD-10-CM

## 2015-12-30 NOTE — Therapy (Signed)
[facility] [place], [place], [address] Phone: [phone]   Fax:  [phone]  Physical Therapy Treatment  Patient Details  Name: Randall Lara MRN: [id number] Date of Birth: Mar 21, 1930 Referring Provider: Latanya Maudlin, MD  Encounter Date: 12/30/2015      PT End of Session - 12/30/15 0944    Visit Number 10   Number of Visits 12   Date for PT Re-Evaluation 01/14/16   PT Start Time 0948   PT Stop Time 1033   PT Time Calculation (min) 45 min   Activity Tolerance Patient tolerated treatment well   Behavior During Therapy [facility] for tasks assessed/performed      Past Medical History  Diagnosis Date  . Atrial fibrillation ([place])   . Joint pain   . Obesity   . Hypertension   . Anxiety   . Heart palpitations   . Dysrhythmia     atrial fib  . Depression   . GERD (gastroesophageal reflux disease)     no meds needed- diet controlled    Past Surgical History  Procedure Laterality Date  . Esophagogastroduodenoscopy  03/29/2002    [id number] esophagus, stomach, and duodenum through the second  portion.  Marland Kitchen Hernia repair    . Total knee arthroplasty Right 11/13/2015    Procedure: TOTAL KNEE ARTHROPLASTY;  Surgeon: Latanya Maudlin, MD;  Location: [facility];  Service: Orthopedics;  Laterality: Right;    There were no vitals filed for this visit.      Subjective Assessment - 12/30/15 0944    Subjective Reports that R knee is "getting better" although he reports that he may have left weights on extension stretch too much yesterday. Reports that he doesn't trust R knee yet and reports he had trouble with R knee for years prior to the surgery. Reports that his greatest difficulty is getting up from the toilet and has to have something to hold onto.   Patient is accompained by: Family member  Wife   Pertinent History HTN, Afib   Patient Stated Goals Decrease pain increase movement   Currently in Pain? Other (Comment)  Reports  soreness today but "not severe" with no pain rating given            [facility] PT Assessment - 12/30/15 0001    Assessment   Medical Diagnosis Aftercare R TKR   Onset Date/Surgical Date 11/13/15   Next MD Visit 02/03/2016   Prior Therapy nursing home x 10 days until 11/27/15                     [facility] Adult PT Treatment/Exercise - 12/30/15 0001    Knee/Hip Exercises: Aerobic   Nustep L7, seat 11 x15 min   Knee/Hip Exercises: Standing   Forward Step Up Right;3 sets;10 reps;Hand Hold: 2;Step Height: 6"   Functional Squat 1 set;10 reps   Rocker Board 3 minutes   Knee/Hip Exercises: Seated   Long Arc Quad Strengthening;Right;3 sets;10 reps;Weights   Long Arc Quad Weight 4 lbs.   Modalities   Modalities Science writer Action IFC   Electrical Stimulation Parameters 1-10 hz x15 min   Electrical Stimulation Goals Pain   Vasopneumatic   Number Minutes Vasopneumatic  15 minutes   Vasopnuematic Location  Knee   Vasopneumatic Pressure Medium   Vasopneumatic Temperature  59  PT Long Term Goals - 12/25/15 1025    PT LONG TERM GOAL #1   Title I with HEP   Time 6   Period Weeks   Status Achieved   PT LONG TERM GOAL #2   Title improved R knee ROM -2 to 120 degrees to normalize gait.   Time 6   Period Weeks   Status On-going  AROM R knee 8-110 deg 12/20/2015   PT LONG TERM GOAL #3   Title Patient able to perform ADLs with 2/10 or less.   Time 6   Period Weeks   Status Achieved   PT LONG TERM GOAL #4   Title Patient to demo 5/5 R knee strength.   Time 6   Period Weeks   Status On-going               Plan - 12/30/15 1020    Clinical Impression Statement Patient tolerated today's treatment fairly well as he required increased multimodal cueing due to hard of hearing. Patient denied increased discomfort or pain with any  of today's exercises. Patient able to complete squats in standing today with hand hold onto parallel bars. Patient continues to report fatigue by the end of therapeutic exercise session. Normal modalities response noted following removal of the modalities. Goals remain on-going at this time secondary to deficits with R knee strength and ROM. Patient is overall fearful with actvity as he does not want to fall or to harm R knee.   Rehab Potential Excellent   PT Frequency 3x / week   PT Duration 6 weeks   PT Treatment/Interventions ADLs/Self Care Home Management;Electrical Stimulation;Cryotherapy;Therapeutic exercise;Neuromuscular re-education;Patient/family education;Manual techniques;Vasopneumatic Device;Passive range of motion;Scar mobilization   PT Next Visit Plan Continue per TKR protocol with emphasis on ROM and strengthening per MPT POC.   PT Home Exercise Plan flex/ext stretches; QS, heel slides, LAQ, seated marching   Consulted and Agree with Plan of Care Patient   Family Member Consulted Wife      Patient will benefit from skilled therapeutic intervention in order to improve the following deficits and impairments:  Abnormal gait, Decreased range of motion, Pain, Decreased scar mobility, Increased edema, Decreased strength  Visit Diagnosis: Pain in right knee  Stiffness of right knee, not elsewhere classified  Localized edema     Problem List Patient Active Problem List   Diagnosis Date Noted  . History of total knee arthroplasty 11/13/2015  . Aortic valve disease 08/23/2014  . Osteoarthritis of both knees 08/23/2014  . History of atrial fibrillation 05/15/2014  . Prediabetes 11/14/2013  . Chest pain 06/16/2013  . Obesity (BMI 30-39.9) 05/15/2013  . Globus sensation 12/01/2012  . GERD (gastroesophageal reflux disease) 11/10/2012  . Joint pain   . Obesity   . Heart palpitations   . HYPERGLYCEMIA 05/08/2010  . Anxiety state 02/11/2009  . Essential hypertension 02/11/2009     Wynelle Fanny, PTA 12/30/2015, 11:14 AM  [facility] [address] [place], [place], [address] Phone: [phone]   Fax:  [phone]  Name: Randall Lara MRN: [id number] Date of Birth: [birth year]

## 2016-01-01 ENCOUNTER — Encounter: Payer: Self-pay | Admitting: Physical Therapy

## 2016-01-01 ENCOUNTER — Ambulatory Visit: Payer: Medicare Other | Admitting: Physical Therapy

## 2016-01-01 DIAGNOSIS — R600 Localized edema: Secondary | ICD-10-CM

## 2016-01-01 DIAGNOSIS — M25661 Stiffness of right knee, not elsewhere classified: Secondary | ICD-10-CM

## 2016-01-01 DIAGNOSIS — M25561 Pain in right knee: Secondary | ICD-10-CM | POA: Diagnosis not present

## 2016-01-01 NOTE — Therapy (Signed)
[facility] [place], [place], [address] Phone: [phone]   Fax:  [phone]  Physical Therapy Treatment  Patient Details  Name: Randall Lara MRN: [id number] Date of Birth: [birth year] Referring Provider: Latanya Maudlin, MD  Encounter Date: 01/01/2016      PT End of Session - 01/01/16 0936    Visit Number 11   Number of Visits 12   Date for PT Re-Evaluation 01/14/16   PT Start Time 0946   PT Stop Time 1036   PT Time Calculation (min) 50 min   Activity Tolerance Patient tolerated treatment well   Behavior During Therapy [facility] for tasks assessed/performed      Past Medical History  Diagnosis Date  . Atrial fibrillation ([place])   . Joint pain   . Obesity   . Hypertension   . Anxiety   . Heart palpitations   . Dysrhythmia     atrial fib  . Depression   . GERD (gastroesophageal reflux disease)     no meds needed- diet controlled    Past Surgical History  Procedure Laterality Date  . Esophagogastroduodenoscopy  03/29/2002    [id number] esophagus, stomach, and duodenum through the second  portion.  Marland Kitchen Hernia repair    . Total knee arthroplasty Right 11/13/2015    Procedure: TOTAL KNEE ARTHROPLASTY;  Surgeon: Latanya Maudlin, MD;  Location: [facility];  Service: Orthopedics;  Laterality: Right;    There were no vitals filed for this visit.      Subjective Assessment - 01/01/16 0936    Subjective Reports that he would like to achieve ROM goal today and has been working on exercises yesterday.   Patient is accompained by: Family member  Wife   Pertinent History HTN, Afib   Patient Stated Goals Decrease pain increase movement   Currently in Pain? Other (Comment)  Reports "a little" pain "all the time."            [facility] PT Assessment - 01/01/16 0001    Assessment   Medical Diagnosis Aftercare R TKR   Onset Date/Surgical Date 11/13/15   Next MD Visit 02/03/2016   Prior Therapy nursing home x 10 days until  11/27/15   ROM / Strength   AROM / PROM / Strength AROM   AROM   Overall AROM  Within functional limits for tasks performed   AROM Assessment Site Knee   Right/Left Knee Right   Right Knee Extension 0   Right Knee Flexion 120                     [facility] Adult PT Treatment/Exercise - 01/01/16 0001    Knee/Hip Exercises: Aerobic   Nustep L7, seat 9 x15 min   Knee/Hip Exercises: Standing   Lateral Step Up Right;3 sets;10 reps;Hand Hold: 2;Step Height: 6"   Forward Step Up Right;4 sets;10 reps;Hand Hold: 2;Step Height: 6"   Functional Squat 1 set;10 reps   Knee/Hip Exercises: Seated   Long Arc Quad Strengthening;Right;3 sets;10 reps;Weights   Long Arc Quad Weight 4 lbs.   Modalities   Modalities Science writer Action IFC   Electrical Stimulation Parameters 1-10 hz x15 min   Electrical Stimulation Goals Pain   Vasopneumatic   Number Minutes Vasopneumatic  15 minutes   Vasopnuematic Location  Knee   Vasopneumatic Pressure Medium   Vasopneumatic Temperature  56  PT Long Term Goals - 01/01/16 1025    PT LONG TERM GOAL #1   Title I with HEP   Time 6   Period Weeks   Status Achieved   PT LONG TERM GOAL #2   Title improved R knee ROM -2 to 120 degrees to normalize gait.   Time 6   Period Weeks   Status Achieved  AROM R knee 0-120 deg 01/01/2016   PT LONG TERM GOAL #3   Title Patient able to perform ADLs with 2/10 or less.   Time 6   Period Weeks   Status Achieved   PT LONG TERM GOAL #4   Title Patient to demo 5/5 R knee strength.   Time 6   Period Weeks   Status On-going               Plan - 01/01/16 1025    Clinical Impression Statement Patient tolerated today's treatment well with no reports of increased pain with exercises today. Patient continues to require moderate to maximal multimodal cueing and VCs  reinforced by patient's wife due to hard of hearing. Patient noted that lateral step ups were not as easy as forward step ups to which he was educated that that may stem from muscle weakness in abductors. AROM R knee measured as 0-120 deg today in supine. Normal modalities response noted following removal of the modalities.   Rehab Potential Excellent   PT Frequency 3x / week   PT Duration 6 weeks   PT Treatment/Interventions ADLs/Self Care Home Management;Electrical Stimulation;Cryotherapy;Therapeutic exercise;Neuromuscular re-education;Patient/family education;Manual techniques;Vasopneumatic Device;Passive range of motion;Scar mobilization   PT Next Visit Plan Continue with TKR protocol with D/C summary required next treatment for D/C.   PT Home Exercise Plan flex/ext stretches; QS, heel slides, LAQ, seated marching   Consulted and Agree with Plan of Care Patient   Family Member Consulted Wife      Patient will benefit from skilled therapeutic intervention in order to improve the following deficits and impairments:  Abnormal gait, Decreased range of motion, Pain, Decreased scar mobility, Increased edema, Decreased strength  Visit Diagnosis: Pain in right knee  Stiffness of right knee, not elsewhere classified  Localized edema     Problem List Patient Active Problem List   Diagnosis Date Noted  . History of total knee arthroplasty 11/13/2015  . Aortic valve disease 08/23/2014  . Osteoarthritis of both knees 08/23/2014  . History of atrial fibrillation 05/15/2014  . Prediabetes 11/14/2013  . Chest pain 06/16/2013  . Obesity (BMI 30-39.9) 05/15/2013  . Globus sensation 12/01/2012  . GERD (gastroesophageal reflux disease) 11/10/2012  . Joint pain   . Obesity   . Heart palpitations   . HYPERGLYCEMIA 05/08/2010  . Anxiety state 02/11/2009  . Essential hypertension 02/11/2009    Randall Lara, PTA 01/01/2016, 10:45 AM  [facility] [address] [place], [place], [address] Phone: [phone]   Fax:  [phone]  Name: Randall Lara MRN: [id number] Date of Birth: 09/22/1929

## 2016-01-03 ENCOUNTER — Ambulatory Visit: Payer: Medicare Other | Admitting: Physical Therapy

## 2016-01-03 DIAGNOSIS — M25561 Pain in right knee: Secondary | ICD-10-CM

## 2016-01-03 DIAGNOSIS — M25661 Stiffness of right knee, not elsewhere classified: Secondary | ICD-10-CM

## 2016-01-03 DIAGNOSIS — R600 Localized edema: Secondary | ICD-10-CM | POA: Diagnosis not present

## 2016-01-03 NOTE — Therapy (Signed)
[facility] [place], [place], [address] Phone: [phone]   Fax:  [phone]  Physical Therapy Treatment  Patient Details  Name: Randall Lara MRN: [id number] Date of Birth: 08-Jan-1930 Referring Provider: Latanya Maudlin, MD  Encounter Date: 01/03/2016      PT End of Session - 01/03/16 0949    Visit Number 12   Number of Visits 12   PT Start Time 0940   PT Stop Time 1034   PT Time Calculation (min) 54 min   Activity Tolerance Patient tolerated treatment well   Behavior During Therapy [facility] for tasks assessed/performed      Past Medical History  Diagnosis Date  . Atrial fibrillation ([place])   . Joint pain   . Obesity   . Hypertension   . Anxiety   . Heart palpitations   . Dysrhythmia     atrial fib  . Depression   . GERD (gastroesophageal reflux disease)     no meds needed- diet controlled    Past Surgical History  Procedure Laterality Date  . Esophagogastroduodenoscopy  03/29/2002    [id number] esophagus, stomach, and duodenum through the second  portion.  Marland Kitchen Hernia repair    . Total knee arthroplasty Right 11/13/2015    Procedure: TOTAL KNEE ARTHROPLASTY;  Surgeon: Latanya Maudlin, MD;  Location: [facility];  Service: Orthopedics;  Laterality: Right;    There were no vitals filed for this visit.      Subjective Assessment - 01/03/16 1518    Subjective It's my last day.   Patient Stated Goals Decrease pain increase movement   Pain Score 2    Pain Location Knee   Pain Orientation Right   Pain Descriptors / Indicators Sore   Pain Type Surgical pain   Pain Onset More than a month ago   Pain Frequency Constant                         [facility] Adult PT Treatment/Exercise - 01/03/16 0001    Knee/Hip Exercises: Aerobic   Nustep Level 7 x 16 minutes.   Modalities   Modalities Retail buyer Location RT knee.   Electrical Stimulation  Action IFC   Electrical Stimulation Goals Pain   Vasopneumatic   Number Minutes Vasopneumatic  15 minutes   Vasopnuematic Location  --  Right knee.   Vasopneumatic Pressure Medium   Manual Therapy   Passive ROM PROM to patient's right knee x 10 minutes.                     PT Long Term Goals - 01/01/16 1025    PT LONG TERM GOAL #1   Title I with HEP   Time 6   Period Weeks   Status Achieved   PT LONG TERM GOAL #2   Title improved R knee ROM -2 to 120 degrees to normalize gait.   Time 6   Period Weeks   Status Achieved  AROM R knee 0-120 deg 01/01/2016   PT LONG TERM GOAL #3   Title Patient able to perform ADLs with 2/10 or less.   Time 6   Period Weeks   Status Achieved   PT LONG TERM GOAL #4   Title Patient to demo 5/5 R knee strength.   Time 6   Period Weeks   Status On-going  Patient will benefit from skilled therapeutic intervention in order to improve the following deficits and impairments:     Visit Diagnosis: Pain in right knee  Stiffness of right knee, not elsewhere classified  Localized edema       G-Codes - 01/12/16 1521    Functional Assessment Tool Used Clinical judgement.   Functional Limitation Mobility: Walking and moving around   Mobility: Walking and Moving Around Current Status [phone]) At least 20 percent but less than 40 percent impaired, limited or restricted   Mobility: Walking and Moving Around Goal Status [phone]) At least 40 percent but less than 60 percent impaired, limited or restricted   Mobility: Walking and Moving Around Discharge Status [phone]) At least 20 percent but less than 40 percent impaired, limited or restricted      Problem List Patient Active Problem List   Diagnosis Date Noted  . History of total knee arthroplasty 11/13/2015  . Aortic valve disease 08/23/2014  . Osteoarthritis of both knees 08/23/2014  . History of atrial fibrillation 05/15/2014  . Prediabetes 11/14/2013  . Chest  pain 06/16/2013  . Obesity (BMI 30-39.9) 05/15/2013  . Globus sensation 12/01/2012  . GERD (gastroesophageal reflux disease) 11/10/2012  . Joint pain   . Obesity   . Heart palpitations   . HYPERGLYCEMIA 05/08/2010  . Anxiety state 02/11/2009  . Essential hypertension 02/11/2009   PHYSICAL THERAPY DISCHARGE SUMMARY  Visits from Start of Care: 12  Current functional level related to goals / functional outcomes: Please see above.   Remaining deficits: Right knee strength goal unmet.   Education / Equipment: HEP.  Plan: Patient agrees to discharge.  Patient goals were partially met. Patient is being discharged due to being pleased with the current functional level.  ?????       APP[place], [place] MPT 12-Jan-2016, 3:24 PM  [facility] [address] [place], [place], [address] Phone: [phone]   Fax:  [phone]  Name: KAESEN RODRIGUEZ MRN: [id number] Date of Birth: 1930-05-23

## 2016-01-30 ENCOUNTER — Other Ambulatory Visit: Payer: Self-pay | Admitting: *Deleted

## 2016-01-30 MED ORDER — APIXABAN 5 MG PO TABS
5.0000 mg | ORAL_TABLET | Freq: Two times a day (BID) | ORAL | Status: DC
Start: 2016-01-30 — End: 2016-03-24

## 2016-02-03 DIAGNOSIS — Z471 Aftercare following joint replacement surgery: Secondary | ICD-10-CM | POA: Diagnosis not present

## 2016-02-03 DIAGNOSIS — Z96651 Presence of right artificial knee joint: Secondary | ICD-10-CM | POA: Diagnosis not present

## 2016-02-17 DIAGNOSIS — Z961 Presence of intraocular lens: Secondary | ICD-10-CM | POA: Diagnosis not present

## 2016-02-17 DIAGNOSIS — H25811 Combined forms of age-related cataract, right eye: Secondary | ICD-10-CM | POA: Diagnosis not present

## 2016-02-20 DIAGNOSIS — R319 Hematuria, unspecified: Secondary | ICD-10-CM | POA: Diagnosis not present

## 2016-02-20 DIAGNOSIS — N390 Urinary tract infection, site not specified: Secondary | ICD-10-CM | POA: Diagnosis not present

## 2016-02-20 DIAGNOSIS — R300 Dysuria: Secondary | ICD-10-CM | POA: Diagnosis not present

## 2016-02-24 ENCOUNTER — Telehealth: Payer: Self-pay | Admitting: Family Medicine

## 2016-02-24 NOTE — Telephone Encounter (Signed)
° °  Pt went to Urgent care and has a UTI

## 2016-02-27 NOTE — Patient Instructions (Signed)
Your procedure is scheduled on:  03/05/2016  Report to [facility] at  41  AM.  Call this number if you have problems the morning of surgery: [phone]   Do not eat food or drink liquids :After Midnight.      Take these medicines the morning of surgery with A SIP OF WATER: xanax, benazepril, lexapro.   Do not wear jewelry, make-up or nail polish.  Do not wear lotions, powders, or perfumes. You may wear deodorant.  Do not shave 48 hours prior to surgery.  Do not bring valuables to the hospital.  Contacts, dentures or bridgework may not be worn into surgery.  Leave suitcase in the car. After surgery it may be brought to your room.  For patients admitted to the hospital, checkout time is 11:00 AM the day of discharge.   Patients discharged the day of surgery will not be allowed to drive home.  :     Please read over the following fact sheets that you were given: Coughing and Deep Breathing, Surgical Site Infection Prevention, Anesthesia Post-op Instructions and Care and Recovery After Surgery    Cataract A cataract is a clouding of the lens of the eye. When a lens becomes cloudy, vision is reduced based on the degree and nature of the clouding. Many cataracts reduce vision to some degree. Some cataracts make people more near-sighted as they develop. Other cataracts increase glare. Cataracts that are ignored and become worse can sometimes look white. The white color can be seen through the pupil. CAUSES   Aging. However, cataracts may occur at any age, even in newborns.   Certain drugs.   Trauma to the eye.   Certain diseases such as diabetes.   Specific eye diseases such as chronic inflammation inside the eye or a sudden attack of a rare form of glaucoma.   Inherited or acquired medical problems.  SYMPTOMS   Gradual, progressive drop in vision in the affected eye.   Severe, rapid visual loss. This most often happens when trauma is the cause.  DIAGNOSIS  To detect a cataract,  an eye doctor examines the lens. Cataracts are best diagnosed with an exam of the eyes with the pupils enlarged (dilated) by drops.  TREATMENT  For an early cataract, vision may improve by using different eyeglasses or stronger lighting. If that does not help your vision, surgery is the only effective treatment. A cataract needs to be surgically removed when vision loss interferes with your everyday activities, such as driving, reading, or watching TV. A cataract may also have to be removed if it prevents examination or treatment of another eye problem. Surgery removes the cloudy lens and usually replaces it with a substitute lens (intraocular lens, IOL).  At a time when both you and your doctor agree, the cataract will be surgically removed. If you have cataracts in both eyes, only one is usually removed at a time. This allows the operated eye to heal and be out of danger from any possible problems after surgery (such as infection or poor wound healing). In rare cases, a cataract may be doing damage to your eye. In these cases, your caregiver may advise surgical removal right away. The vast majority of people who have cataract surgery have better vision afterward. HOME CARE INSTRUCTIONS  If you are not planning surgery, you may be asked to do the following:  Use different eyeglasses.   Use stronger or brighter lighting.   Ask your eye doctor about reducing your  medicine dose or changing medicines if it is thought that a medicine caused your cataract. Changing medicines does not make the cataract go away on its own.   Become familiar with your surroundings. Poor vision can lead to injury. Avoid bumping into things on the affected side. You are at a higher risk for tripping or falling.   Exercise extreme care when driving or operating machinery.   Wear sunglasses if you are sensitive to bright light or experiencing problems with glare.  SEEK IMMEDIATE MEDICAL CARE IF:   You have a worsening or  sudden vision loss.   You notice redness, swelling, or increasing pain in the eye.   You have a fever.  Document Released: 06/29/2005 Document Revised: 06/18/2011 Document Reviewed: 02/20/2011 [facility] Patient Information 2012 [place].PATIENT INSTRUCTIONS POST-ANESTHESIA  IMMEDIATELY FOLLOWING SURGERY:  Do not drive or operate machinery for the first twenty four hours after surgery.  Do not make any important decisions for twenty four hours after surgery or while taking narcotic pain medications or sedatives.  If you develop intractable nausea and vomiting or a severe headache please notify your doctor immediately.  FOLLOW-UP:  Please make an appointment with your surgeon as instructed. You do not need to follow up with anesthesia unless specifically instructed to do so.  WOUND CARE INSTRUCTIONS (if applicable):  Keep a dry clean dressing on the anesthesia/puncture wound site if there is drainage.  Once the wound has quit draining you may leave it open to air.  Generally you should leave the bandage intact for twenty four hours unless there is drainage.  If the epidural site drains for more than 36-48 hours please call the anesthesia department.  QUESTIONS?:  Please feel free to call your physician or the hospital operator if you have any questions, and they will be happy to assist you.

## 2016-02-28 ENCOUNTER — Encounter (HOSPITAL_COMMUNITY)
Admission: RE | Admit: 2016-02-28 | Discharge: 2016-02-28 | Disposition: A | Discharge status: Home / Self Care | Payer: Medicare Other | Source: Ambulatory Visit | Attending: Ophthalmology | Admitting: Ophthalmology

## 2016-02-28 ENCOUNTER — Other Ambulatory Visit: Payer: Self-pay

## 2016-02-28 ENCOUNTER — Encounter (HOSPITAL_COMMUNITY): Payer: Self-pay

## 2016-02-28 DIAGNOSIS — Z01810 Encounter for preprocedural cardiovascular examination: Secondary | ICD-10-CM | POA: Insufficient documentation

## 2016-02-28 DIAGNOSIS — Z01812 Encounter for preprocedural laboratory examination: Secondary | ICD-10-CM | POA: Diagnosis not present

## 2016-02-28 HISTORY — DX: Unspecified osteoarthritis, unspecified site: M19.90

## 2016-02-28 LAB — CBC WITH DIFFERENTIAL/PLATELET
Basophils Absolute: 0 K/uL (ref 0.0–0.1)
Basophils Relative: 1 %
Eosinophils Absolute: 0.2 K/uL (ref 0.0–0.7)
Eosinophils Relative: 4 %
HCT: 41.4 % (ref 39.0–52.0)
Hemoglobin: 13.6 g/dL (ref 13.0–17.0)
Lymphocytes Relative: 36 %
Lymphs Abs: 1.5 K/uL (ref 0.7–4.0)
MCH: 30.8 pg (ref 26.0–34.0)
MCHC: 32.9 g/dL (ref 30.0–36.0)
MCV: 93.9 fL (ref 78.0–100.0)
Monocytes Absolute: 0.5 K/uL (ref 0.1–1.0)
Monocytes Relative: 13 %
Neutro Abs: 2 K/uL (ref 1.7–7.7)
Neutrophils Relative %: 46 %
Platelets: 117 K/uL — ABNORMAL LOW (ref 150–400)
RBC: 4.41 MIL/uL (ref 4.22–5.81)
RDW: 13.8 % (ref 11.5–15.5)
WBC: 4.2 K/uL (ref 4.0–10.5)

## 2016-02-28 LAB — BASIC METABOLIC PANEL WITH GFR
Anion gap: 7 (ref 5–15)
BUN: 15 mg/dL (ref 6–20)
CO2: 28 mmol/L (ref 22–32)
Calcium: 9 mg/dL (ref 8.9–10.3)
Chloride: 104 mmol/L (ref 101–111)
Creatinine, Ser: 0.84 mg/dL (ref 0.61–1.24)
GFR calc Af Amer: >60 mL/min (ref >60)
GFR calc non Af Amer: >60 mL/min (ref >60)
Glucose, Bld: 92 mg/dL (ref 65–99)
Potassium: 3.9 mmol/L (ref 3.5–5.1)
Sodium: 139 mmol/L (ref 135–145)

## 2016-02-28 NOTE — Pre-Procedure Instructions (Signed)
EKG shown to Dr Patsey Berthold. No orders given. Will proceed with surgery.

## 2016-02-28 NOTE — Pre-Procedure Instructions (Signed)
Patient given inforamation to sign up for my chart at home.

## 2016-03-02 ENCOUNTER — Encounter: Payer: Self-pay | Admitting: Family Medicine

## 2016-03-02 ENCOUNTER — Ambulatory Visit (INDEPENDENT_AMBULATORY_CARE_PROVIDER_SITE_OTHER): Payer: Medicare Other | Admitting: Family Medicine

## 2016-03-02 VITALS — BP 100/70 | HR 62 | Temp 97.7°F | Ht 72.0 in | Wt 214.0 lb

## 2016-03-02 DIAGNOSIS — R300 Dysuria: Secondary | ICD-10-CM | POA: Diagnosis not present

## 2016-03-02 LAB — POCT URINALYSIS DIPSTICK
Bilirubin, UA: NEGATIVE
Blood, UA: NEGATIVE
GLUCOSE UA: NEGATIVE
Ketones, UA: NEGATIVE
Leukocytes, UA: NEGATIVE
Nitrite, UA: NEGATIVE
PROTEIN UA: NEGATIVE
UROBILINOGEN UA: 0.2
pH, UA: 6

## 2016-03-02 NOTE — Progress Notes (Signed)
Subjective:     Patient ID: Randall Lara, male   DOB: 1930/02/09, 82 y.o.   MRN: [id number]  HPI Patient seen for follow-up regarding recent possible UTI We have no records, but about 2 weeks ago he went to urgent care in [facility]. He had urine frequency and burning with urination. He was placed on Levaquin 500 milligrams once daily for 7 days. Symptoms resolved after couple days. Denies any urine frequency or burning at this time. No fevers or chills.  Past Medical History:  Diagnosis Date  . Anxiety   . Arthritis   . Atrial fibrillation ([place])   . Depression   . Dysrhythmia    atrial fib  . GERD (gastroesophageal reflux disease)    no meds needed- diet controlled  . Heart palpitations   . Hypertension   . Joint pain   . Obesity    Past Surgical History:  Procedure Laterality Date  . CATARACT EXTRACTION Left   . ESOPHAGOGASTRODUODENOSCOPY  03/29/2002   [id number] esophagus, stomach, and duodenum through the second  portion.  . hernia repair Bilateral    Inguinal  . TOTAL KNEE ARTHROPLASTY Right 11/13/2015   Procedure: TOTAL KNEE ARTHROPLASTY;  Surgeon: Latanya Maudlin, MD;  Location: [facility];  Service: Orthopedics;  Laterality: Right;    reports that he quit smoking about 40 years ago. His smoking use included Cigarettes. He started smoking about 60 years ago. He has a 20.00 pack-year smoking history. He has never used smokeless tobacco. He reports that he drinks alcohol. He reports that he does not use drugs. family history includes Heart attack in his brother; Hypertension in his mother and sister; Stroke in his father, mother, and sister. Allergies  Allergen Reactions  . Ferrous Sulfate     REACTION: swelling, hives. Doesn't recall.  . Sulfa Antibiotics Hives and Swelling     Review of Systems  Constitutional: Negative for chills and fever.  Genitourinary: Negative for dysuria and frequency.       Objective:   Physical Exam  Constitutional: He appears  well-developed and well-nourished.  Cardiovascular: Normal rate and regular rhythm.   Pulmonary/Chest: Effort normal and breath sounds normal. No respiratory distress. He has no wheezes. He has no rales.       Assessment:     Recent UTI. Symptomatically resolved following recent antibiotics as above    Plan:     -Repeat urinalysis with dipstick today -If normal, observe for any recurrent symptoms  Randall Post MD [facility] at [facility]

## 2016-03-02 NOTE — Patient Instructions (Signed)
Stay well-hydrated Follow-up for any recurrent symptoms such as urine frequency with burning or any unexplained fevers or chills

## 2016-03-04 ENCOUNTER — Encounter (HOSPITAL_COMMUNITY): Payer: Self-pay | Admitting: Anesthesiology

## 2016-03-04 NOTE — Anesthesia Preprocedure Evaluation (Addendum)
Anesthesia Evaluation  Patient identified by MRN, date of birth, ID band Patient awake    Reviewed: Allergy & Precautions, NPO status , Patient's Chart, lab work & pertinent test results, reviewed documented beta blocker date and time   Airway Mallampati: II  TM Distance: >3 FB Neck ROM: Full    Dental no notable dental hx. (+) Teeth Intact   Pulmonary former smoker,    Pulmonary exam normal breath sounds clear to auscultation       Cardiovascular hypertension, Pt. on medications + dysrhythmias Atrial Fibrillation  Rhythm:Irregular Rate:Bradycardia     Neuro/Psych PSYCHIATRIC DISORDERS Anxiety Depression    GI/Hepatic GERD  Medicated and Controlled,  Endo/Other    Renal/GU   negative genitourinary   Musculoskeletal  (+) Arthritis ,   Abdominal   Peds  Hematology On Eliquis for A.Fib Thrombocytopenia   Anesthesia Other Findings   Reproductive/Obstetrics                           EKG: atrial fibrillation, rate 47, Q waves in V1-V2, RAD. Echo-2011- - Left ventricle: The cavity size was normal. Wall thickness was  increased in a pattern of mild LVH. Systolic function was  vigorous. The estimated ejection fraction was in the range of 65%  to 70%. Wall motion was normal; there were no regional wall motion  abnormalities. - Aortic valve: Mild regurgitation. - Left atrium: The atrium was moderately dilated Lab Results  Component Value Date   WBC 4.2 02/28/2016   HGB 13.6 02/28/2016   HCT 41.4 02/28/2016   MCV 93.9 02/28/2016   PLT 117 (L) 02/28/2016     Chemistry      Component Value Date/Time   NA 139 02/28/2016 0810   K 3.9 02/28/2016 0810   CL 104 02/28/2016 0810   CO2 28 02/28/2016 0810   BUN 15 02/28/2016 0810   CREATININE 0.84 02/28/2016 0810   CREATININE 0.98 09/09/2015 1115      Component Value Date/Time   CALCIUM 9.0 02/28/2016 0810   ALKPHOS 71 11/05/2015 0905    AST 34 11/05/2015 0905   ALT 21 11/05/2015 0905   BILITOT 0.6 11/05/2015 0905      Anesthesia Physical Anesthesia Plan  ASA: III  Anesthesia Plan: MAC   Post-op Pain Management:    Induction:   Airway Management Planned: Natural Airway and Nasal Cannula  Additional Equipment:   Intra-op Plan:   Post-operative Plan:   Informed Consent: I have reviewed the patients History and Physical, chart, labs and discussed the procedure including the risks, benefits and alternatives for the proposed anesthesia with the patient or authorized representative who has indicated his/her understanding and acceptance.     Plan Discussed with: Anesthesiologist, CRNA and Surgeon  Anesthesia Plan Comments:         Anesthesia Quick Evaluation

## 2016-03-05 ENCOUNTER — Encounter (HOSPITAL_COMMUNITY): Payer: Self-pay | Admitting: *Deleted

## 2016-03-05 ENCOUNTER — Ambulatory Visit (HOSPITAL_COMMUNITY): Payer: Medicare Other | Admitting: Anesthesiology

## 2016-03-05 ENCOUNTER — Encounter (HOSPITAL_COMMUNITY)
Admission: RE | Disposition: A | Discharge status: Home / Self Care | Payer: Self-pay | Source: Ambulatory Visit | Attending: Ophthalmology

## 2016-03-05 ENCOUNTER — Ambulatory Visit (HOSPITAL_COMMUNITY)
Admission: RE | Admit: 2016-03-05 | Discharge: 2016-03-05 | Disposition: A | Discharge status: Home / Self Care | Payer: Medicare Other | Source: Ambulatory Visit | Attending: Ophthalmology | Admitting: Ophthalmology

## 2016-03-05 DIAGNOSIS — I4891 Unspecified atrial fibrillation: Secondary | ICD-10-CM | POA: Diagnosis not present

## 2016-03-05 DIAGNOSIS — Z87891 Personal history of nicotine dependence: Secondary | ICD-10-CM | POA: Insufficient documentation

## 2016-03-05 DIAGNOSIS — F329 Major depressive disorder, single episode, unspecified: Secondary | ICD-10-CM | POA: Insufficient documentation

## 2016-03-05 DIAGNOSIS — Z79899 Other long term (current) drug therapy: Secondary | ICD-10-CM | POA: Diagnosis not present

## 2016-03-05 DIAGNOSIS — K219 Gastro-esophageal reflux disease without esophagitis: Secondary | ICD-10-CM | POA: Insufficient documentation

## 2016-03-05 DIAGNOSIS — H25811 Combined forms of age-related cataract, right eye: Secondary | ICD-10-CM | POA: Diagnosis not present

## 2016-03-05 DIAGNOSIS — F419 Anxiety disorder, unspecified: Secondary | ICD-10-CM | POA: Insufficient documentation

## 2016-03-05 DIAGNOSIS — I10 Essential (primary) hypertension: Secondary | ICD-10-CM | POA: Insufficient documentation

## 2016-03-05 DIAGNOSIS — H2511 Age-related nuclear cataract, right eye: Secondary | ICD-10-CM | POA: Diagnosis not present

## 2016-03-05 DIAGNOSIS — M1990 Unspecified osteoarthritis, unspecified site: Secondary | ICD-10-CM | POA: Insufficient documentation

## 2016-03-05 HISTORY — PX: CATARACT EXTRACTION W/PHACO: SHX586

## 2016-03-05 SURGERY — PHACOEMULSIFICATION, CATARACT, WITH IOL INSERTION
Anesthesia: Monitor Anesthesia Care | Site: Eye | Laterality: Right

## 2016-03-05 MED ORDER — TETRACAINE HCL 0.5 % OP SOLN
1.0000 [drp] | OPHTHALMIC | Status: AC
  Administered 2016-03-05 (×3): 1 [drp] via OPHTHALMIC

## 2016-03-05 MED ORDER — CYCLOPENTOLATE-PHENYLEPHRINE 0.2-1 % OP SOLN
1.0000 [drp] | OPHTHALMIC | Status: AC
  Administered 2016-03-05 (×3): 1 [drp] via OPHTHALMIC

## 2016-03-05 MED ORDER — LACTATED RINGERS IV SOLN
INTRAVENOUS | Status: DC
  Administered 2016-03-05: 1000 mL via INTRAVENOUS

## 2016-03-05 MED ORDER — LIDOCAINE HCL 3.5 % OP GEL
1.0000 "application " | Freq: Once | OPHTHALMIC | Status: AC
  Administered 2016-03-05: 1 via OPHTHALMIC

## 2016-03-05 MED ORDER — BSS IO SOLN
INTRAOCULAR | Status: DC | PRN
  Administered 2016-03-05: 15 mL via INTRAOCULAR

## 2016-03-05 MED ORDER — EPINEPHRINE HCL 1 MG/ML IJ SOLN
INTRAMUSCULAR | Status: AC
  Filled 2016-03-05: qty 1

## 2016-03-05 MED ORDER — LIDOCAINE 3.5 % OP GEL OPTIME - NO CHARGE
OPHTHALMIC | Status: DC | PRN
  Administered 2016-03-05: 1 [drp] via OPHTHALMIC

## 2016-03-05 MED ORDER — MIDAZOLAM HCL 2 MG/2ML IJ SOLN
1.0000 mg | INTRAMUSCULAR | Status: DC | PRN
  Administered 2016-03-05: 1 mg via INTRAVENOUS
  Filled 2016-03-05: qty 2

## 2016-03-05 MED ORDER — BSS IO SOLN
INTRAOCULAR | Status: DC | PRN
  Administered 2016-03-05 (×2): 500 mL

## 2016-03-05 MED ORDER — POVIDONE-IODINE 5 % OP SOLN
OPHTHALMIC | Status: DC | PRN
  Administered 2016-03-05: 1 via OPHTHALMIC

## 2016-03-05 MED ORDER — LIDOCAINE HCL (PF) 1 % IJ SOLN
INTRAMUSCULAR | Status: DC | PRN
  Administered 2016-03-05: .5 mL

## 2016-03-05 MED ORDER — SODIUM CHLORIDE 0.9 % IJ SOLN
INTRAMUSCULAR | Status: AC
  Filled 2016-03-05: qty 10

## 2016-03-05 MED ORDER — PROVISC 10 MG/ML IO SOLN
INTRAOCULAR | Status: DC | PRN
  Administered 2016-03-05: 0.85 mL via INTRAOCULAR

## 2016-03-05 MED ORDER — FENTANYL CITRATE (PF) 100 MCG/2ML IJ SOLN
25.0000 ug | INTRAMUSCULAR | Status: AC
  Administered 2016-03-05: 25 ug via INTRAVENOUS
  Filled 2016-03-05: qty 2

## 2016-03-05 MED ORDER — GLYCOPYRROLATE 0.2 MG/ML IJ SOLN
INTRAMUSCULAR | Status: DC | PRN
  Administered 2016-03-05: 0.2 mg via INTRAVENOUS

## 2016-03-05 MED ORDER — PHENYLEPHRINE HCL 2.5 % OP SOLN
1.0000 [drp] | OPHTHALMIC | Status: AC
  Administered 2016-03-05 (×3): 1 [drp] via OPHTHALMIC

## 2016-03-05 MED ORDER — NEOMYCIN-POLYMYXIN-DEXAMETH 3.5-10000-0.1 OP SUSP
OPHTHALMIC | Status: DC | PRN
  Administered 2016-03-05: 2 [drp] via OPHTHALMIC

## 2016-03-05 SURGICAL SUPPLY — 23 items
CAPSULAR TENSION RING-AMO (OPHTHALMIC RELATED) IMPLANT
CLOTH BEACON ORANGE TIMEOUT ST (SAFETY) ×2 IMPLANT
EYE SHIELD UNIVERSAL CLEAR (GAUZE/BANDAGES/DRESSINGS) ×2 IMPLANT
GLOVE BIOGEL PI IND STRL 7.0 (GLOVE) IMPLANT
GLOVE BIOGEL PI IND STRL 7.5 (GLOVE) IMPLANT
GLOVE BIOGEL PI INDICATOR 7.0 (GLOVE) ×2
GLOVE BIOGEL PI INDICATOR 7.5 (GLOVE)
GLOVE EXAM NITRILE LRG STRL (GLOVE) IMPLANT
GLOVE EXAM NITRILE MD LF STRL (GLOVE) ×2 IMPLANT
KIT VITRECTOMY (OPHTHALMIC RELATED) IMPLANT
PAD ARMBOARD 7.5X6 YLW CONV (MISCELLANEOUS) ×2 IMPLANT
PROC W NO LENS (INTRAOCULAR LENS)
PROC W SPEC LENS (INTRAOCULAR LENS)
PROCESS W NO LENS (INTRAOCULAR LENS) IMPLANT
PROCESS W SPEC LENS (INTRAOCULAR LENS) IMPLANT
RETRACTOR IRIS SIGHTPATH (OPHTHALMIC RELATED) IMPLANT
RING MALYGIN (MISCELLANEOUS) IMPLANT
SIGHTPATH CAT PROC W REG LENS (Ophthalmic Related) ×3 IMPLANT
SYRINGE LUER LOK 1CC (MISCELLANEOUS) ×2 IMPLANT
TAPE SURG TRANSPORE 1 IN (GAUZE/BANDAGES/DRESSINGS) IMPLANT
TAPE SURGICAL TRANSPORE 1 IN (GAUZE/BANDAGES/DRESSINGS) ×2
VISCOELASTIC ADDITIONAL (OPHTHALMIC RELATED) IMPLANT
WATER STERILE IRR 250ML POUR (IV SOLUTION) ×2 IMPLANT

## 2016-03-05 NOTE — Op Note (Signed)
Date of Admission: 03/05/2016  Date of Surgery: 03/05/2016   Pre-Op Dx: Cataract Right Eye  Post-Op Dx: Senile Combined Cataract Right  Eye,  Dx Code [id number]  Surgeon: Tonny Branch, M.D.  Assistants: None  Anesthesia: Topical with MAC  Indications: Painless, progressive loss of vision with compromise of daily activities.  Surgery: Cataract Extraction with Intraocular lens Implant Right Eye  Discription: The patient had dilating drops and viscous lidocaine placed into the Right eye in the pre-op holding area. After transfer to the operating room, a time out was performed. The patient was then prepped and draped. Beginning with a 83 degree blade a paracentesis port was made at the surgeon's 2 o'clock position. The anterior chamber was then filled with 1% non-preserved lidocaine. This was followed by filling the anterior chamber with Provisc.  A 2.7mm keratome blade was used to make a clear corneal incision at the temporal limbus.  A bent cystatome needle was used to create a continuous tear capsulotomy. Hydrodissection was performed with balanced salt solution on a Fine canula. The lens nucleus was then removed using the phacoemulsification handpiece. Residual cortex was removed with the I&A handpiece. The anterior chamber and capsular bag were refilled with Provisc. A posterior chamber intraocular lens was placed into the capsular bag with it's injector. The implant was positioned with the Kuglan hook. The Provisc was then removed from the anterior chamber and capsular bag with the I&A handpiece. Stromal hydration of the main incision and paracentesis port was performed with BSS on a Fine canula. The wounds were tested for leak which was negative. The patient tolerated the procedure well. There were no operative complications. The patient was then transferred to the recovery room in stable condition.  Complications: None  Specimen: None  EBL: None  Prosthetic device: Hoya iSert 250, power 18.5  D, SN [id number].

## 2016-03-05 NOTE — Anesthesia Procedure Notes (Signed)
Procedure Name: MAC Date/Time: 03/05/2016 10:53 AM Performed by: Vista Deck Pre-anesthesia Checklist: Patient identified, Emergency Drugs available, Suction available, Timeout performed and Patient being monitored Patient Re-evaluated:Patient Re-evaluated prior to inductionOxygen Delivery Method: Nasal Cannula

## 2016-03-05 NOTE — Addendum Note (Signed)
Addendum  created 03/05/16 1158 by Josephine Igo, MD   Sign clinical note

## 2016-03-05 NOTE — H&P (Signed)
I have reviewed the H&P, the patient was re-examined, and I have identified no interval changes in medical condition and plan of care since the history and physical of record  

## 2016-03-05 NOTE — Discharge Instructions (Signed)

## 2016-03-05 NOTE — Anesthesia Postprocedure Evaluation (Signed)
Anesthesia Post Note  Patient: Randall Lara  Procedure(s) Performed: Procedure(s) (LRB): CATARACT EXTRACTION PHACO AND INTRAOCULAR LENS PLACEMENT ; CDE:  11.47 (Right)  Anesthesia Post Evaluation  Last Vitals:  Vitals:   03/05/16 1045 03/05/16 1050  BP: (!) 148/70   Resp: 13 (!) 42  Temp:      Last Pain: There were no vitals filed for this visit.               Marcanthony Sleight     Anesthesia Post-op Note  Patient: Randall Lara  Procedure(s) Performed: Procedure(s) (LRB): CATARACT EXTRACTION PHACO AND INTRAOCULAR LENS PLACEMENT ; CDE:  11.47 (Right)  Patient Location:  Short Stay  Anesthesia Type: MAC  Level of Consciousness: awake  Airway and Oxygen Therapy: Patient Spontanous Breathing  Post-op Pain: none  Post-op Assessment: Post-op Vital signs reviewed, Patient's Cardiovascular Status Stable, Respiratory Function Stable, Patent Airway, No signs of Nausea or vomiting and Pain level controlled  Post-op Vital Signs: Reviewed and stable  Complications: No apparent anesthesia complications

## 2016-03-05 NOTE — Transfer of Care (Signed)
Immediate Anesthesia Transfer of Care Note  Patient: Randall Lara  Procedure(s) Performed: Procedure(s): CATARACT EXTRACTION PHACO AND INTRAOCULAR LENS PLACEMENT ; CDE:  11.47 (Right)  Immediate Anesthesia Transfer of Care Note  Patient: Randall Lara  Procedure(s) Performed: Procedure(s) (LRB): CATARACT EXTRACTION PHACO AND INTRAOCULAR LENS PLACEMENT ; CDE:  11.47 (Right)  Patient Location: Shortstay  Anesthesia Type: MAC  Level of Consciousness: awake  Airway & Oxygen Therapy: Patient Spontanous Breathing   Post-op Assessment: Report given to PACU RN, Post -op Vital signs reviewed and stable and Patient moving all extremities  Post vital signs: Reviewed and stable  Complications: No apparent anesthesia complications

## 2016-03-05 NOTE — Anesthesia Postprocedure Evaluation (Signed)
Anesthesia Post Note  Patient: Randall Lara  Procedure(s) Performed: Procedure(s) (LRB): CATARACT EXTRACTION PHACO AND INTRAOCULAR LENS PLACEMENT ; CDE:  11.47 (Right)  Patient location during evaluation: PACU Anesthesia Type: MAC Level of consciousness: awake and alert Pain management: pain level controlled Vital Signs Assessment: post-procedure vital signs reviewed and stable Respiratory status: spontaneous breathing, nonlabored ventilation and respiratory function stable Cardiovascular status: stable and blood pressure returned to baseline Postop Assessment: no signs of nausea or vomiting Anesthetic complications: no    Last Vitals:  Vitals:   03/05/16 1050 03/05/16 1120  BP:  137/81  Pulse:  (!) 53  Resp: (!) 42   Temp:  36.7 C    Last Pain:  Vitals:   03/05/16 1120  TempSrc: Oral                 Waco Foerster A.

## 2016-03-09 ENCOUNTER — Ambulatory Visit: Payer: Medicare Other | Admitting: Cardiology

## 2016-03-10 ENCOUNTER — Encounter (HOSPITAL_COMMUNITY): Payer: Self-pay | Admitting: Ophthalmology

## 2016-03-24 ENCOUNTER — Other Ambulatory Visit: Payer: Self-pay | Admitting: Cardiology

## 2016-04-13 DIAGNOSIS — Z471 Aftercare following joint replacement surgery: Secondary | ICD-10-CM | POA: Diagnosis not present

## 2016-04-13 DIAGNOSIS — Z96651 Presence of right artificial knee joint: Secondary | ICD-10-CM | POA: Diagnosis not present

## 2016-05-25 ENCOUNTER — Other Ambulatory Visit: Payer: Self-pay | Admitting: Family Medicine

## 2016-06-24 ENCOUNTER — Encounter: Payer: Self-pay | Admitting: Family Medicine

## 2016-06-24 ENCOUNTER — Ambulatory Visit (INDEPENDENT_AMBULATORY_CARE_PROVIDER_SITE_OTHER): Payer: Medicare Other | Admitting: Family Medicine

## 2016-06-24 VITALS — BP 110/68 | HR 61 | Temp 97.8°F | Ht 72.0 in | Wt 210.5 lb

## 2016-06-24 DIAGNOSIS — B356 Tinea cruris: Secondary | ICD-10-CM | POA: Diagnosis not present

## 2016-06-24 DIAGNOSIS — L989 Disorder of the skin and subcutaneous tissue, unspecified: Secondary | ICD-10-CM | POA: Diagnosis not present

## 2016-06-24 DIAGNOSIS — F411 Generalized anxiety disorder: Secondary | ICD-10-CM

## 2016-06-24 DIAGNOSIS — R001 Bradycardia, unspecified: Secondary | ICD-10-CM

## 2016-06-24 DIAGNOSIS — Z8679 Personal history of other diseases of the circulatory system: Secondary | ICD-10-CM

## 2016-06-24 DIAGNOSIS — I10 Essential (primary) hypertension: Secondary | ICD-10-CM

## 2016-06-24 MED ORDER — NYSTATIN-TRIAMCINOLONE 100000-0.1 UNIT/GM-% EX OINT: 1.0000 "application " | TOPICAL_OINTMENT | Freq: Two times a day (BID) | CUTANEOUS | 3 refills | Status: DC

## 2016-06-24 MED ORDER — ALPRAZOLAM 1 MG PO TABS: 1.0000 mg | ORAL_TABLET | Freq: Three times a day (TID) | ORAL | 5 refills | Status: DC | PRN

## 2016-06-24 MED ORDER — BENAZEPRIL-HYDROCHLOROTHIAZIDE 20-12.5 MG PO TABS: 1.0000 | ORAL_TABLET | Freq: Every day | ORAL | 3 refills | Status: DC

## 2016-06-24 NOTE — Progress Notes (Signed)
Subjective:     Patient ID: Randall Lara, male   DOB: [birth year], 82 y.o.   MRN: [id number]  HPI Patient here for multiple items as follows  History of hypertension. Has previously taken Benazepril HCTZ 20/12.5 one half tablet daily. He had several recent blood pressures as high as [id number] systolic and he himself titrated this up to 1 full tablet. Blood pressure improved since then. He has some chronic bradycardia and is followed by cardiology. History of atrial fibrillation on Eliquis. No recent bleeding complications. No chest pains.  No recent dizziness or syncope  Hyperkeratotic skin lesion left base of neck. Slowly growing in size. Frequently irritated because of clothing. No bleeding.  Recurrent pruritic skin rash groin region. Previously has responded to nystatin triamcinolone and requesting refill.  He has chronic anxiety and for several years has been on Xanax. He also also taking Lexapro. We tried titrating SSRIs and getting off Xanax but he did not tolerate that well at all.  Past Medical History:  Diagnosis Date  . Anxiety   . Arthritis   . Atrial fibrillation ([place])   . Depression   . Dysrhythmia    atrial fib  . GERD (gastroesophageal reflux disease)    no meds needed- diet controlled  . Heart palpitations   . Hypertension   . Joint pain   . Obesity    Past Surgical History:  Procedure Laterality Date  . CATARACT EXTRACTION Left   . CATARACT EXTRACTION W/PHACO Right 03/05/2016   Procedure: CATARACT EXTRACTION PHACO AND INTRAOCULAR LENS PLACEMENT ; CDE:  11.47;  Surgeon: Tonny Branch, MD;  Location: [facility];  Service: Ophthalmology;  Laterality: Right;  . ESOPHAGOGASTRODUODENOSCOPY  03/29/2002   [id number] esophagus, stomach, and duodenum through the second  portion.  . hernia repair Bilateral    Inguinal  . TOTAL KNEE ARTHROPLASTY Right 11/13/2015   Procedure: TOTAL KNEE ARTHROPLASTY;  Surgeon: Latanya Maudlin, MD;  Location: [facility];  Service: Orthopedics;  Laterality: Right;     reports that he quit smoking about 40 years ago. His smoking use included Cigarettes. He started smoking about 60 years ago. He has a 20.00 pack-year smoking history. He has never used smokeless tobacco. He reports that he drinks alcohol. He reports that he does not use drugs. family history includes Heart attack in his brother; Hypertension in his mother and sister; Stroke in his father, mother, and sister. Allergies  Allergen Reactions  . Ferrous Sulfate     REACTION: swelling, hives. Doesn't recall.  . Sulfa Antibiotics Hives and Swelling     Review of Systems  Constitutional: Negative for fatigue.  Eyes: Negative for visual disturbance.  Respiratory: Negative for cough, chest tightness and shortness of breath.   Cardiovascular: Negative for chest pain, palpitations and leg swelling.  Skin: Positive for rash.  Neurological: Negative for dizziness, syncope, weakness, light-headedness and headaches.       Objective:   Physical Exam  Constitutional: He is oriented to person, place, and time. He appears well-developed and well-nourished.  HENT:  Right Ear: External ear normal.  Left Ear: External ear normal.  Mouth/Throat: Oropharynx is clear and moist.  Eyes: Pupils are equal, round, and reactive to light.  Neck: Neck supple. No thyromegaly present.  Cardiovascular: Regular rhythm.   Patient has decreased heart rate by my palpation of 48 regular  Pulmonary/Chest: Effort normal and breath sounds normal. No respiratory distress. He has no wheezes. He has no rales.  Musculoskeletal: He exhibits no edema.  Neurological: He  is alert and oriented to person, place, and time.       Assessment:     #1 hypertension. Recent elevation which is improved with increasing his Lotensin HCTZ to 1 full tablet daily  #2 history of chronic bradycardia. He's not had any syncope or dizziness recently  #3 history of atrial fibrillation on chronic anticoagulation  #4 hyperkeratotic skin  lesion left base of neck. Rule out squamous cell carcinoma  #5 recurrent tinea cruris    Plan:     -Scheduled have skin lesion excised. He does not need to stop Eliquis in advance. -Refill nystatin/ triamcinolone for as needed use -Refill Lotensin HCTZ for 1 full tablet daily -Refill alprazolam for 6 months -Continue close follow-up with cardiology  [name] MD [facility] at [facility]

## 2016-06-24 NOTE — Progress Notes (Signed)
Pre visit review using our clinic review tool, if applicable. No additional management support is needed unless otherwise documented below in the visit note. 

## 2016-07-28 ENCOUNTER — Encounter: Payer: Self-pay | Admitting: Family Medicine

## 2016-07-28 ENCOUNTER — Other Ambulatory Visit: Payer: Self-pay

## 2016-07-28 ENCOUNTER — Ambulatory Visit (INDEPENDENT_AMBULATORY_CARE_PROVIDER_SITE_OTHER): Payer: Medicare Other | Admitting: Family Medicine

## 2016-07-28 VITALS — BP 120/60 | HR 80 | Temp 98.1°F | Ht 72.0 in | Wt 213.4 lb

## 2016-07-28 DIAGNOSIS — R319 Hematuria, unspecified: Secondary | ICD-10-CM

## 2016-07-28 DIAGNOSIS — C4442 Squamous cell carcinoma of skin of scalp and neck: Secondary | ICD-10-CM | POA: Diagnosis not present

## 2016-07-28 DIAGNOSIS — L989 Disorder of the skin and subcutaneous tissue, unspecified: Secondary | ICD-10-CM | POA: Diagnosis not present

## 2016-07-28 LAB — POCT URINALYSIS DIPSTICK
Glucose, UA: NEGATIVE
Nitrite, UA: NEGATIVE
PH UA: 5.5
Spec Grav, UA: 1.02

## 2016-07-28 MED ORDER — CEPHALEXIN 500 MG PO CAPS: 500.0000 mg | ORAL_CAPSULE | Freq: Three times a day (TID) | ORAL | 0 refills | Status: DC

## 2016-07-28 NOTE — Progress Notes (Signed)
Pre visit review using our clinic review tool, if applicable. No additional management support is needed unless otherwise documented below in the visit note. 

## 2016-07-28 NOTE — Patient Instructions (Signed)
Keep wound dry for the first 24 hours then clean daily with soap and water for one week. Apply topical antibiotic daily for 3-4 days. Keep covered with clean dressing for 4-5 days. Follow up promptly for any signs of infection such as redness, warmth, pain, or drainage.  

## 2016-07-28 NOTE — Progress Notes (Signed)
Subjective:     Patient ID: Randall Lara, male   DOB: 12-Mar-1930, 83 y.o.   MRN: [id number]  HPI Patient here for shave excision of left neck lesion-noted recently on exam. However, on the way here today he states he stopped and went to the bathroom and had gross hematuria. He has had couple day history of some urine frequency but only mild burning with urination this morning. He reportedly had UTI earlier this year. He states these symptoms are similar though more severe. He denies any fevers or chills. No flank pain. No history of kidney stones. He does take blood thinner-Eliquis-for history of atrial fibrillation denies any other bleeding complications.   Skin lesion not recently growing in size. Frequently irritated by clothing. No prior history of skin cancer  Past Medical History:  Diagnosis Date  . Anxiety   . Arthritis   . Atrial fibrillation ([place])   . Depression   . Dysrhythmia    atrial fib  . GERD (gastroesophageal reflux disease)    no meds needed- diet controlled  . Heart palpitations   . Hypertension   . Joint pain   . Obesity    Past Surgical History:  Procedure Laterality Date  . CATARACT EXTRACTION Left   . CATARACT EXTRACTION W/PHACO Right 03/05/2016   Procedure: CATARACT EXTRACTION PHACO AND INTRAOCULAR LENS PLACEMENT ; CDE:  11.47;  Surgeon: Tonny Branch, MD;  Location: [facility];  Service: Ophthalmology;  Laterality: Right;  . ESOPHAGOGASTRODUODENOSCOPY  03/29/2002   [id number] esophagus, stomach, and duodenum through the second  portion.  . hernia repair Bilateral    Inguinal  . TOTAL KNEE ARTHROPLASTY Right 11/13/2015   Procedure: TOTAL KNEE ARTHROPLASTY;  Surgeon: Latanya Maudlin, MD;  Location: [facility];  Service: Orthopedics;  Laterality: Right;    reports that he quit smoking about 41 years ago. His smoking use included Cigarettes. He started smoking about 61 years ago. He has a 20.00 pack-year smoking history. He has never used smokeless tobacco. He reports that he  drinks alcohol. He reports that he does not use drugs. family history includes Heart attack in his brother; Hypertension in his mother and sister; Stroke in his father, mother, and sister. Allergies  Allergen Reactions  . Ferrous Sulfate     REACTION: swelling, hives. Doesn't recall.  . Sulfa Antibiotics Hives and Swelling     Review of Systems  Constitutional: Negative for appetite change and unexpected weight change.  Respiratory: Negative for cough.   Cardiovascular: Negative for chest pain.  Gastrointestinal: Negative for abdominal pain.  Genitourinary: Positive for frequency and hematuria. Negative for decreased urine volume, difficulty urinating, discharge, flank pain, scrotal swelling and testicular pain.       Objective:   Physical Exam  Constitutional: He appears well-developed and well-nourished.  Cardiovascular: Normal rate.   Pulmonary/Chest: Effort normal and breath sounds normal. No respiratory distress. He has no wheezes. He has no rales.  Skin:  Base of left neck he has approximately 3-4 mm hyperkeratotic raised skin lesion       Assessment:     #1 gross hematuria. Urine dipstick reveals blood and also leukocytes. Possible infection with acute cystitis.  #2 hyperkeratotic skin lesion left neck. Rule out squamous cell carcinoma    Plan:     -Urine culture sent -Start Keflex 500 mg 3 times a day pending culture results -If culture negative, will need urology referral for further workup of hematuria. -Follow-up in 2 weeks and repeat urine then -Discussed risks and  benefits of shave excision including risks of bleeding, bruising, scar formation, and infection and patient consented to proceed.  Anesthesia with 1% xylocaine with epi.  Skin prepped with betadine and alcohol and shave excision of lesion with #15 blade with minimal bleeding. Bleeding controlled with silver nitrate. Patient tolerated well.  Antibiotic and dressing  Applied. -Specimen sent to pathology  for further evaluation  [name] MD [facility] at [facility]

## 2016-07-31 ENCOUNTER — Other Ambulatory Visit: Payer: Self-pay

## 2016-07-31 LAB — URINE CULTURE

## 2016-07-31 MED ORDER — AMOXICILLIN 500 MG PO CAPS: 500.0000 mg | ORAL_CAPSULE | Freq: Three times a day (TID) | ORAL | 0 refills | Status: DC

## 2016-08-03 ENCOUNTER — Telehealth: Payer: Self-pay | Admitting: Family Medicine

## 2016-08-03 ENCOUNTER — Encounter: Payer: Self-pay | Admitting: Family Medicine

## 2016-08-03 ENCOUNTER — Ambulatory Visit (INDEPENDENT_AMBULATORY_CARE_PROVIDER_SITE_OTHER): Payer: Medicare Other | Admitting: Family Medicine

## 2016-08-03 VITALS — BP 122/80 | HR 56 | Temp 98.7°F | Wt 213.4 lb

## 2016-08-03 DIAGNOSIS — R319 Hematuria, unspecified: Secondary | ICD-10-CM

## 2016-08-03 LAB — POC URINALSYSI DIPSTICK (AUTOMATED)
BILIRUBIN UA: NEGATIVE
GLUCOSE UA: NEGATIVE
KETONES UA: NEGATIVE
Nitrite, UA: NEGATIVE
PH UA: 5
Protein, UA: NEGATIVE
Spec Grav, UA: 1.02
Urobilinogen, UA: 1

## 2016-08-03 NOTE — Telephone Encounter (Signed)
Patient Name: DEIONTA GAILLIARD DOB: 06/30/1930 Initial Comment caller states husband had blood in his urine Nurse Assessment Nurse: Manson Allan, RN, Melissa Date/Time Eilene Ghazi Time): 08/03/2016 12:29:39 PM Confirm and document reason for call. If symptomatic, describe symptoms. ---Caller states that her husband has blood in his urine. He was seen in the office last Tuesday and treated for a bacterial infection. The blood was in his urine one time yesterday. He is on Eliqius. Does the patient have any new or worsening symptoms? ---Yes Will a triage be completed? ---Yes Related visit to physician within the last 2 weeks? ---Yes Does the PT have any chronic conditions? (i.e. diabetes, asthma, etc.) ---Yes List chronic conditions. ---Hypertension, Atrial Fibrillation Is this a behavioral health or substance abuse call? ---No Guidelines Guideline Title Affirmed Question Affirmed Notes Urine - Blood In Taking Coumadin (warfarin) or other strong blood thinner, or known bleeding disorder (e.g., thrombocytopenia) Final Disposition User See Physician within 4 Hours (or PCP triage) Manson Allan, RN, Melissa Comments Appt schedule for today at 2:45 with Almira Coaster. Referrals REFERRED TO PCP OFFICE Disagree/Comply: Comply

## 2016-08-03 NOTE — Patient Instructions (Signed)
Please continue and complete medication that Dr. Elease Hashimoto prescribed for your urinary tract infection. Your urine results indicate that you are improving with this treatment. Please follow up to recheck your urine at the 2 week appointment that was scheduled previously.  If symptoms do not continue to improve, worsen, or you develop a fever >100, please follow up for further evaluation and treatment.  Increase fluids and drink water until your urine is pale yellow or clear.

## 2016-08-03 NOTE — Telephone Encounter (Signed)
Noted  

## 2016-08-03 NOTE — Progress Notes (Signed)
Pre visit review using our clinic review tool, if applicable. No additional management support is needed unless otherwise documented below in the visit note. 

## 2016-08-03 NOTE — Progress Notes (Signed)
Subjective:    Patient ID: Randall Lara, male    DOB: 10/03/1929, 82 y.o.   MRN: [id number]  HPI  Randall Lara is an 83 year old male who presents today with a report of hematuria that started on 07/28/16. He states that he had one episode of  hematuria yesterday but noted today that hematuria is no longer present. He was seen and treated on 07/28/16 for a UTI. His culture was positive for enterococcus. His Keflex was discontinued and he was started on Amoxicillin 500 mg po TID for 7 days which he started on 07/31/16 with one dose on the 07/31/16.  He denies adverse effects of medication. He denies fever, chills, sweats, flank pain, or other unusual bleeding. He denies urinary frequency and states that mild burning has improved. He currently takes Eliquis for history of atrial fibrillation.  He reports not drinking water yesterday during the day when he noticed blood in his urine. He started drinking water today and he states that he has not noticed blood in his urine today.  Review of Systems  Constitutional: Negative for chills and fever.  Respiratory: Negative for cough, shortness of breath and wheezing.   Cardiovascular: Negative for chest pain and palpitations.  Gastrointestinal: Negative for diarrhea, nausea and vomiting.  Genitourinary: Positive for hematuria. Negative for dysuria, flank pain, frequency and urgency.  Musculoskeletal: Negative for myalgias.  Skin: Negative for rash.  Neurological: Negative for dizziness and headaches.   Past Medical History:  Diagnosis Date  . Anxiety   . Arthritis   . Atrial fibrillation ([place])   . Depression   . Dysrhythmia    atrial fib  . GERD (gastroesophageal reflux disease)    no meds needed- diet controlled  . Heart palpitations   . Hypertension   . Joint pain   . Obesity      Social History   Social History  . Marital status: Married    Spouse name: N/A  . Number of children: N/A  . Years of education: N/A   Occupational History   . Not on file.   Social History Main Topics  . Smoking status: Former Smoker    Packs/day: 1.00    Years: 20.00    Types: Cigarettes    Start date: 08/07/1955    Quit date: 08/07/1975  . Smokeless tobacco: Never Used  . Alcohol use 0.0 oz/week     Comment: occassional  . Drug use: No  . Sexual activity: Yes    Birth control/ protection: None   Other Topics Concern  . Not on file   Social History Narrative  . No narrative on file    Past Surgical History:  Procedure Laterality Date  . CATARACT EXTRACTION Left   . CATARACT EXTRACTION W/PHACO Right 03/05/2016   Procedure: CATARACT EXTRACTION PHACO AND INTRAOCULAR LENS PLACEMENT ; CDE:  11.47;  Surgeon: Tonny Branch, MD;  Location: [facility];  Service: Ophthalmology;  Laterality: Right;  . ESOPHAGOGASTRODUODENOSCOPY  03/29/2002   [id number] esophagus, stomach, and duodenum through the second  portion.  . hernia repair Bilateral    Inguinal  . TOTAL KNEE ARTHROPLASTY Right 11/13/2015   Procedure: TOTAL KNEE ARTHROPLASTY;  Surgeon: Latanya Maudlin, MD;  Location: [facility];  Service: Orthopedics;  Laterality: Right;    Family History  Problem Relation Age of Onset  . Stroke Mother   . Hypertension Mother   . Stroke Father   . Stroke Sister   . Heart attack Brother   . Hypertension  Sister   . Colon cancer Neg Hx     Allergies  Allergen Reactions  . Ferrous Sulfate     REACTION: swelling, hives. Doesn't recall.  . Sulfa Antibiotics Hives and Swelling    Current Outpatient Prescriptions on File Prior to Visit  Medication Sig Dispense Refill  . ALPRAZolam (XANAX) 1 MG tablet Take 1 tablet (1 mg total) by mouth 3 (three) times daily as needed for anxiety. 90 tablet 5  . amoxicillin (AMOXIL) 500 MG capsule Take 1 capsule (500 mg total) by mouth 3 (three) times daily. 21 capsule 0  . benazepril-hydrochlorthiazide (LOTENSIN HCT) 20-12.5 MG tablet Take 1 tablet by mouth daily. 90 tablet 3  . ELIQUIS 5 MG TABS tablet TAKE 1 TABLET BY  MOUTH TWICE DAILY 60 tablet 11  . escitalopram (LEXAPRO) 10 MG tablet Take 1 tablet (10 mg total) by mouth daily. 30 tablet 11  . nitroGLYCERIN (NITROSTAT) 0.4 MG SL tablet Place 1 tablet (0.4 mg total) under the tongue every 5 (five) minutes as needed for chest pain. 25 tablet 11  . nystatin-triamcinolone ointment (MYCOLOG) Apply 1 application topically 2 (two) times daily. 30 g 3   No current facility-administered medications on file prior to visit.     BP 122/80 (BP Location: Left Arm, Patient Position: Sitting, Cuff Size: Normal)   Pulse (!) 56   Temp 98.7 F (37.1 C) (Oral)   Wt 213 lb 6.4 oz (96.8 kg)   SpO2 98%   BMI 28.94 kg/m        Objective:   Physical Exam  Constitutional: He is oriented to person, place, and time. He appears well-developed and well-nourished.  Eyes: Pupils are equal, round, and reactive to light. No scleral icterus.  Neck: Neck supple.  Cardiovascular: Normal rate and regular rhythm.   Pulmonary/Chest: Effort normal and breath sounds normal. He has no wheezes. He has no rales.  Abdominal: Soft. Bowel sounds are normal. There is no tenderness. There is no CVA tenderness.  Lymphadenopathy:    He has no cervical adenopathy.  Neurological: He is alert and oriented to person, place, and time.  Skin: Skin is warm and dry. No rash noted.  Psychiatric: He has a normal mood and affect. His behavior is normal. Judgment and thought content normal.       Assessment & Plan:  1. Hematuria, unspecified type Suspect this episode is related to resolving cystitis. UA indicates trace of blood and trace of leukocytes which is an improvement from previous UA noted 6 days ago. No other bleeding abnormalities are noted today. Advised completion of amoxicillin and follow up in 2 weeks for a recheck of urine as scheduled previously or sooner if needed. - POCT Urinalysis Dipstick (Automated)  Delano Metz, FNP-C

## 2016-08-11 ENCOUNTER — Encounter: Payer: Self-pay | Admitting: Family Medicine

## 2016-08-11 ENCOUNTER — Ambulatory Visit (INDEPENDENT_AMBULATORY_CARE_PROVIDER_SITE_OTHER): Payer: Medicare Other | Admitting: Family Medicine

## 2016-08-11 VITALS — BP 120/70 | HR 53 | Ht 72.0 in | Wt 212.0 lb

## 2016-08-11 DIAGNOSIS — C4492 Squamous cell carcinoma of skin, unspecified: Secondary | ICD-10-CM | POA: Diagnosis not present

## 2016-08-11 DIAGNOSIS — R300 Dysuria: Secondary | ICD-10-CM | POA: Diagnosis not present

## 2016-08-11 DIAGNOSIS — Z8679 Personal history of other diseases of the circulatory system: Secondary | ICD-10-CM

## 2016-08-11 LAB — POCT URINALYSIS DIPSTICK
Bilirubin, UA: NEGATIVE
Blood, UA: NEGATIVE
Glucose, UA: NEGATIVE
Ketones, UA: NEGATIVE
Leukocytes, UA: NEGATIVE
Nitrite, UA: NEGATIVE
Protein, UA: NEGATIVE
Spec Grav, UA: 1.025
Urobilinogen, UA: 1
pH, UA: 5.5

## 2016-08-11 NOTE — Patient Instructions (Signed)
Get back on Eliquis We will call you will dermatology appointment

## 2016-08-11 NOTE — Progress Notes (Signed)
Pre visit review using our clinic review tool, if applicable. No additional management support is needed unless otherwise documented below in the visit note. 

## 2016-08-11 NOTE — Progress Notes (Signed)
Subjective:     Patient ID: Randall Lara, male   DOB: Mar 16, 1930, 83 y.o.   MRN: [id number]  HPI Patient seen for follow-up regarding recent UTI. Culture grew out enterococcus. We initially had placed him on Keflex and subsequent change to amoxicillin and his symptoms are improved but he still has some very mild occasional dysuria. Has not seen any gross hematuria. He takes Eliquis for atrial fibrillation.  Recent growth left side of neck. This was removed and came back well-differentiated squamous cell carcinoma. Has healed well with no difficulties.  Patient is on Eliquis as above for atrial fibrillation. He comes in with several questions today. He has had several friends have had bleeding complications on anticoagulants and has questions about weighing out pros and cons of anticoagulation. He is generally stable on his feet has not had any recent falls. No history of any GI bleeds. He's had no bleeding complications whatsoever other than some mild hematuria on recent UTI  Past Medical History:  Diagnosis Date  . Anxiety   . Arthritis   . Atrial fibrillation ([place])   . Depression   . Dysrhythmia    atrial fib  . GERD (gastroesophageal reflux disease)    no meds needed- diet controlled  . Heart palpitations   . Hypertension   . Joint pain   . Obesity    Past Surgical History:  Procedure Laterality Date  . CATARACT EXTRACTION Left   . CATARACT EXTRACTION W/PHACO Right 03/05/2016   Procedure: CATARACT EXTRACTION PHACO AND INTRAOCULAR LENS PLACEMENT ; CDE:  11.47;  Surgeon: Tonny Branch, MD;  Location: [facility];  Service: Ophthalmology;  Laterality: Right;  . ESOPHAGOGASTRODUODENOSCOPY  03/29/2002   [id number] esophagus, stomach, and duodenum through the second  portion.  . hernia repair Bilateral    Inguinal  . TOTAL KNEE ARTHROPLASTY Right 11/13/2015   Procedure: TOTAL KNEE ARTHROPLASTY;  Surgeon: Latanya Maudlin, MD;  [facility];  Service: Orthopedics;  Laterality: Right;    reports that he quit smoking about 41 years ago. His smoking use included Cigarettes. He started smoking about 61 years ago. He has a 20.00 pack-year smoking history. He has never used smokeless tobacco. He reports that he drinks alcohol. He reports that he does not use drugs. family history includes Heart attack in his brother; Hypertension in his mother and sister; Stroke in his father, mother, and sister. Allergies  Allergen Reactions  . Ferrous Sulfate     REACTION: swelling, hives. Doesn't recall.  . Sulfa Antibiotics Hives and Swelling     Review of Systems  Constitutional: Negative for chills and fever.  Respiratory: Negative for cough.   Cardiovascular: Negative for chest pain.  Gastrointestinal: Negative for abdominal pain.  Genitourinary: Negative for flank pain, frequency and hematuria.       Objective:   Physical Exam  Constitutional: He appears well-developed and well-nourished.  Neck: Neck supple.  Cardiovascular: Normal rate.   Pulmonary/Chest: Effort normal and breath sounds normal. No respiratory distress. He has no wheezes. He has no rales.  Musculoskeletal: He exhibits no edema.  Skin:  Small eschar left side of neck from recent biopsy. Healing well  Psychiatric: He has a normal mood and affect. His behavior is normal.       Assessment:     #1 recent UTI with positive enterococcus culture clinically improved  #2 well-differentiated squamous cell carcinoma skin left side of neck  #3 atrial fibrillation on Eliquis.    Plan:     -Refer [facility] regarding recent squamous cell carcinoma left side of neck -Repeat urinalysis today-clear -Long discussion regarding benefits of anticoagulation for atrial fibrillation especially with his age and multiple risk factors  [name] MD  [facility] at [facility]

## 2016-08-17 DIAGNOSIS — C4442 Squamous cell carcinoma of skin of scalp and neck: Secondary | ICD-10-CM | POA: Diagnosis not present

## 2016-08-17 DIAGNOSIS — L570 Actinic keratosis: Secondary | ICD-10-CM | POA: Diagnosis not present

## 2016-09-08 ENCOUNTER — Ambulatory Visit: Payer: Medicare Other | Admitting: Cardiovascular Disease

## 2016-09-18 ENCOUNTER — Ambulatory Visit (INDEPENDENT_AMBULATORY_CARE_PROVIDER_SITE_OTHER): Payer: Medicare Other | Admitting: Cardiovascular Disease

## 2016-09-18 ENCOUNTER — Encounter: Payer: Self-pay | Admitting: Cardiovascular Disease

## 2016-09-18 VITALS — BP 117/56 | HR 44 | Ht 72.0 in | Wt 213.0 lb

## 2016-09-18 DIAGNOSIS — I359 Nonrheumatic aortic valve disorder, unspecified: Secondary | ICD-10-CM | POA: Diagnosis not present

## 2016-09-18 DIAGNOSIS — I10 Essential (primary) hypertension: Secondary | ICD-10-CM

## 2016-09-18 DIAGNOSIS — I482 Chronic atrial fibrillation, unspecified: Secondary | ICD-10-CM

## 2016-09-18 MED ORDER — NITROGLYCERIN 0.4 MG SL SUBL: 0.4000 mg | SUBLINGUAL_TABLET | SUBLINGUAL | 11 refills | Status: DC | PRN

## 2016-09-18 NOTE — Progress Notes (Signed)
SUBJECTIVE: The patient presents for follow-up of chronic atrial fibrillation and aortic regurgitation. Echocardiogram on 07/01/10 demonstrated normal left ventricular systolic function, EF 01-77%, with mild aortic regurgitation. He has no known history of ischemic heart disease and underwent a low risk nuclear stress test on 07/07/13.   He denies chest pain, palpitations, leg swelling, lightheadedness, dizziness, syncope, and shortness of breath.   He is not on AV nodal blocking agents. He likes to garden. He underwent right knee replacement surgery last year. 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 They used to own a home at [facility] and vacation there every summer.   Review of Systems: As per "subjective", otherwise negative.  Allergies  Allergen Reactions  . Ferrous Sulfate     REACTION: swelling, hives. Doesn't recall.  . Sulfa Antibiotics Hives and Swelling    Current Outpatient Prescriptions  Medication Sig Dispense Refill  . ALPRAZolam (XANAX) 1 MG tablet Take 1 tablet (1 mg total) by mouth 3 (three) times daily as needed for anxiety. 90 tablet 5  . benazepril-hydrochlorthiazide (LOTENSIN HCT) 20-12.5 MG tablet Take 1 tablet by mouth daily. 90 tablet 3  . ELIQUIS 5 MG TABS tablet TAKE 1 TABLET BY MOUTH TWICE DAILY 60 tablet 11  . escitalopram (LEXAPRO) 10 MG tablet Take 1 tablet (10 mg total) by mouth daily. 30 tablet 11  . nitroGLYCERIN (NITROSTAT) 0.4 MG SL tablet Place 1 tablet (0.4 mg total) under the tongue every 5 (five) minutes as needed for chest pain. 25 tablet 11  . nystatin-triamcinolone ointment (MYCOLOG) Apply 1 application topically 2 (two) times daily. (Patient taking differently: Apply 1 application topically 2 (two) times daily as needed. ) 30 g 3   No current facility-administered medications for this visit.     Past Medical History:  Diagnosis Date  . Anxiety   . Arthritis   . Atrial fibrillation ([place])   .  Depression   . Dysrhythmia    atrial fib  . GERD (gastroesophageal reflux disease)    no meds needed- diet controlled  . Heart palpitations   . Hypertension   . Joint pain   . Obesity     Past Surgical History:  Procedure Laterality Date  . CATARACT EXTRACTION Left   . CATARACT EXTRACTION W/PHACO Right 03/05/2016   Procedure: CATARACT EXTRACTION PHACO AND INTRAOCULAR LENS PLACEMENT ; CDE:  11.47;  Surgeon: Tonny Branch, MD;  Location: [facility];  Service: Ophthalmology;  Laterality: Right;  . ESOPHAGOGASTRODUODENOSCOPY  03/29/2002   [id number] esophagus, stomach, and duodenum through the second  portion.  . hernia repair Bilateral    Inguinal  . TOTAL KNEE ARTHROPLASTY Right 11/13/2015   Procedure: TOTAL KNEE ARTHROPLASTY;  Surgeon: Latanya Maudlin, MD;  Location: [facility];  Service: Orthopedics;  Laterality: Right;    Social History   Social History  . Marital status: Married    Spouse name: N/A  . Number of children: N/A  . Years of education: N/A   Occupational History  . Not on file.   Social History Main Topics  . Smoking status: Former Smoker    Packs/day: 1.00    Years: 20.00    Types: Cigarettes    Start date: 08/07/1955    Quit date: 08/07/1975  . Smokeless tobacco: Never Used  . Alcohol use 0.0 oz/week     Comment: occassional  . Drug use: No  . Sexual activity: Yes    Birth control/  protection: None   Other Topics Concern  . Not on file   Social History Narrative  . No narrative on file     Vitals:   09/18/16 1309  BP: (!) 117/56  Pulse: (!) 44  Weight: 213 lb (96.6 kg)  Height: 6' (1.829 m)    PHYSICAL EXAM General: NAD HEENT: Normal. Neck: No JVD, no thyromegaly. Lungs: Clear to auscultation bilaterally with normal respiratory effort. CV: Nondisplaced PMI.  Bradycardic, irregular rhythm, normal S1/S2, no S3, 1/6 systolic murmur over RUSB and apex. No pretibial or periankle edema.     Abdomen: Soft, nontender, no distention.  Neurologic: Alert  and oriented.  Psych: Normal affect. Skin: Normal.    ECG: Most recent ECG reviewed.      ASSESSMENT AND PLAN:  1. Atrial fibrillation: Symptomatically stable. Anticoagulated with Eliquis. HR low without AV nodal blocking agents.  2. Essential HTN: Controlled. No changes.  3. Aortic regurgitation: Asymptomatic.  Dispo: fu 1 yr  Kate Sable, M.D., F.A.C.C.

## 2016-09-18 NOTE — Patient Instructions (Signed)
Your physician wants you to follow-up in: 1 YEAR WITH DR KONESWARAN You will receive a reminder letter in the mail two months in advance. If you don't receive a letter, please call our office to schedule the follow-up appointment.  Your physician recommends that you continue on your current medications as directed. Please refer to the Current Medication list given to you today.  Thank you for choosing [facility]!!    

## 2016-09-22 ENCOUNTER — Other Ambulatory Visit: Payer: Self-pay | Admitting: Family Medicine

## 2016-12-01 DIAGNOSIS — Z961 Presence of intraocular lens: Secondary | ICD-10-CM | POA: Diagnosis not present

## 2016-12-16 ENCOUNTER — Other Ambulatory Visit: Payer: Self-pay | Admitting: Family Medicine

## 2016-12-16 NOTE — Telephone Encounter (Signed)
Ok to refill for 30 days  

## 2016-12-16 NOTE — Telephone Encounter (Signed)
Last refill 12/16/16 and last office visit 08/11/16.  Okay to fill?

## 2016-12-18 DIAGNOSIS — I10 Essential (primary) hypertension: Secondary | ICD-10-CM | POA: Diagnosis not present

## 2016-12-18 DIAGNOSIS — Z7901 Long term (current) use of anticoagulants: Secondary | ICD-10-CM | POA: Diagnosis not present

## 2016-12-18 DIAGNOSIS — Z87891 Personal history of nicotine dependence: Secondary | ICD-10-CM | POA: Diagnosis not present

## 2016-12-18 DIAGNOSIS — W228XXA Striking against or struck by other objects, initial encounter: Secondary | ICD-10-CM | POA: Diagnosis not present

## 2016-12-18 DIAGNOSIS — Z79899 Other long term (current) drug therapy: Secondary | ICD-10-CM | POA: Diagnosis not present

## 2016-12-18 DIAGNOSIS — S0003XA Contusion of scalp, initial encounter: Secondary | ICD-10-CM | POA: Diagnosis not present

## 2016-12-18 DIAGNOSIS — I4891 Unspecified atrial fibrillation: Secondary | ICD-10-CM | POA: Diagnosis not present

## 2017-01-20 ENCOUNTER — Other Ambulatory Visit: Payer: Self-pay | Admitting: Family Medicine

## 2017-01-20 NOTE — Telephone Encounter (Signed)
Refill for 6 months. 

## 2017-01-20 NOTE — Telephone Encounter (Signed)
Last refill 12/16/16 and last office visit 08/11/16.  Okay to fill?

## 2017-01-21 NOTE — Telephone Encounter (Signed)
Rx done. 

## 2017-02-08 ENCOUNTER — Encounter: Payer: Self-pay | Admitting: Family Medicine

## 2017-02-08 ENCOUNTER — Ambulatory Visit (INDEPENDENT_AMBULATORY_CARE_PROVIDER_SITE_OTHER): Payer: Medicare Other | Admitting: Family Medicine

## 2017-02-08 VITALS — BP 120/70 | HR 75 | Temp 98.4°F | Wt 205.1 lb

## 2017-02-08 DIAGNOSIS — Z8679 Personal history of other diseases of the circulatory system: Secondary | ICD-10-CM

## 2017-02-08 DIAGNOSIS — I10 Essential (primary) hypertension: Secondary | ICD-10-CM | POA: Diagnosis not present

## 2017-02-08 DIAGNOSIS — F411 Generalized anxiety disorder: Secondary | ICD-10-CM

## 2017-02-08 DIAGNOSIS — F5104 Psychophysiologic insomnia: Secondary | ICD-10-CM | POA: Diagnosis not present

## 2017-02-08 DIAGNOSIS — R7303 Prediabetes: Secondary | ICD-10-CM

## 2017-02-08 LAB — CBC WITH DIFFERENTIAL/PLATELET
BASOS PCT: 0.8 % (ref 0.0–3.0)
Basophils Absolute: 0 10*3/uL (ref 0.0–0.1)
EOS PCT: 2 % (ref 0.0–5.0)
Eosinophils Absolute: 0.1 10*3/uL (ref 0.0–0.7)
HEMATOCRIT: 41.6 % (ref 39.0–52.0)
HEMOGLOBIN: 13.7 g/dL (ref 13.0–17.0)
LYMPHS PCT: 31.1 % (ref 12.0–46.0)
Lymphs Abs: 1.4 10*3/uL (ref 0.7–4.0)
MCHC: 33 g/dL (ref 30.0–36.0)
MCV: 97.7 fl (ref 78.0–100.0)
MONO ABS: 0.5 10*3/uL (ref 0.1–1.0)
Monocytes Relative: 10.3 % (ref 3.0–12.0)
NEUTROS ABS: 2.4 10*3/uL (ref 1.4–7.7)
Neutrophils Relative %: 55.8 % (ref 43.0–77.0)
PLATELETS: 113 10*3/uL — AB (ref 150.0–400.0)
RBC: 4.26 Mil/uL (ref 4.22–5.81)
RDW: 13.7 % (ref 11.5–15.5)
WBC: 4.4 10*3/uL (ref 4.0–10.5)

## 2017-02-08 LAB — BASIC METABOLIC PANEL
BUN: 16 mg/dL (ref 6–23)
CHLORIDE: 103 meq/L (ref 96–112)
CO2: 30 meq/L (ref 19–32)
Calcium: 9.1 mg/dL (ref 8.4–10.5)
Creatinine, Ser: 0.99 mg/dL (ref 0.40–1.50)
GFR: 76.28 mL/min (ref >60.00)
Glucose, Bld: 131 mg/dL — ABNORMAL HIGH (ref 70–99)
POTASSIUM: 3.8 meq/L (ref 3.5–5.1)
Sodium: 140 mEq/L (ref 135–145)

## 2017-02-08 MED ORDER — MIRTAZAPINE 15 MG PO TABS: 15.0000 mg | ORAL_TABLET | Freq: Every day | ORAL | 5 refills | Status: DC

## 2017-02-08 NOTE — Patient Instructions (Addendum)

## 2017-02-08 NOTE — Progress Notes (Signed)
Subjective:     Patient ID: Randall Lara, male   DOB: May 05, 1930, 83 y.o.   MRN: [id number]  HPI Patient seen for routine medical follow-up. He has chronic problems including history of hypertension, atrial fibrillation, prediabetes, chronic anxiety, chronic insomnia.  Complaining of increasing problems with sleeping. He frequently lays in bed for hours and can't get to sleep. His wife states that he takes infrequent naps. He sometimes sleeps till late morning. No late day use of caffeine. No alcohol use. Currently takes alprazolam which he has been on for he estimates over 30 years. Also on Lexapro 10 mg once daily. He is not sure he seen any benefit from that.  Remains on eliquis for atrial fibrillation. No chest pains. No falls. No dyspnea. No dizziness.  Depression screen [facility] 2/9 02/08/2017 05/20/2015 05/15/2014  Decreased Interest 0 3 0  Down, Depressed, Hopeless 0 3 0  PHQ - 2 Score 0 6 0  Altered sleeping - 2 -  Tired, decreased energy - 1 -  Change in appetite - 0 -  Feeling bad or failure about yourself  - 2 -  Trouble concentrating - 0 -  Moving slowly or fidgety/restless - 0 -  Suicidal thoughts - 1 -  PHQ-9 Score - 12 -  Difficult doing work/chores - Not difficult at all -     Past Medical History:  Diagnosis Date  . Anxiety   . Arthritis   . Atrial fibrillation ([place])   . Depression   . Dysrhythmia    atrial fib  . GERD (gastroesophageal reflux disease)    no meds needed- diet controlled  . Heart palpitations   . Hypertension   . Joint pain   . Obesity    Past Surgical History:  Procedure Laterality Date  . CATARACT EXTRACTION Left   . CATARACT EXTRACTION W/PHACO Right 03/05/2016   Procedure: CATARACT EXTRACTION PHACO AND INTRAOCULAR LENS PLACEMENT ; CDE:  11.47;  Surgeon: Tonny Branch, MD;  Location: [facility];  Service: Ophthalmology;  Laterality: Right;  . ESOPHAGOGASTRODUODENOSCOPY  03/29/2002   [id number] esophagus, stomach, and duodenum through the second   portion.  . hernia repair Bilateral    Inguinal  . TOTAL KNEE ARTHROPLASTY Right 11/13/2015   Procedure: TOTAL KNEE ARTHROPLASTY;  Surgeon: Latanya Maudlin, MD;  Location: [facility];  Service: Orthopedics;  Laterality: Right;    reports that he quit smoking about 41 years ago. His smoking use included Cigarettes. He started smoking about 61 years ago. He has a 20.00 pack-year smoking history. He has never used smokeless tobacco. He reports that he drinks alcohol. He reports that he does not use drugs. family history includes Heart attack in his brother; Hypertension in his mother and sister; Stroke in his father, mother, and sister. Allergies  Allergen Reactions  . Ferrous Sulfate     REACTION: swelling, hives. Doesn't recall.  . Sulfa Antibiotics Hives and Swelling     Review of Systems  Constitutional: Negative for fatigue.  Eyes: Negative for visual disturbance.  Respiratory: Negative for cough, chest tightness and shortness of breath.   Cardiovascular: Negative for chest pain, palpitations and leg swelling.  Gastrointestinal: Negative for blood in stool.  Genitourinary: Negative for dysuria.  Neurological: Negative for dizziness, syncope, weakness, light-headedness and headaches.  Psychiatric/Behavioral: Positive for sleep disturbance. Negative for agitation and confusion.       Objective:   Physical Exam  Constitutional: He is oriented to person, place, and time. He appears well-developed and well-nourished.  HENT:  Right Ear: External ear normal.  Left Ear: External ear normal.  Mouth/Throat: Oropharynx is clear and moist.  Eyes: Pupils are equal, round, and reactive to light.  Neck: Neck supple. No thyromegaly present.  Cardiovascular: Normal rate.   Murmur heard. Systolic murmur over aortic area Irregular rhythm  Pulmonary/Chest: Effort normal and breath sounds normal. No respiratory distress. He has no wheezes. He has no rales.  Musculoskeletal: He exhibits no edema.   Neurological: He is alert and oriented to person, place, and time.       Assessment:     #1 hypertension stable and at goal  #2 history of atrial fibrillation  #3 chronic insomnia  #4 chronic anxiety    Plan:     -Discontinue Lexapro -Trial of mirtazapine 15 mg daily at bedtime -Sleep hygiene discussed with handout given -Check basic metabolic panel and CBC -Routine follow-up in 6 months and sooner as needed  [name] MD [facility]

## 2017-04-11 ENCOUNTER — Other Ambulatory Visit: Payer: Self-pay | Admitting: Cardiovascular Disease

## 2017-06-10 ENCOUNTER — Other Ambulatory Visit: Payer: Self-pay | Admitting: Family Medicine

## 2017-07-11 ENCOUNTER — Other Ambulatory Visit: Payer: Self-pay | Admitting: Family Medicine

## 2017-07-15 NOTE — Telephone Encounter (Signed)
ALPRAZolam (XANAX) 1 MG tablet Last refill 01/21/17 and last office visit 02/08/17. Okay to refill?

## 2017-07-15 NOTE — Telephone Encounter (Signed)
Refill Remeron for 6 months.  Refill Xanax once and make sure he has follow up within one month.

## 2017-08-10 ENCOUNTER — Other Ambulatory Visit: Payer: Self-pay | Admitting: Cardiovascular Disease

## 2017-08-11 ENCOUNTER — Ambulatory Visit (INDEPENDENT_AMBULATORY_CARE_PROVIDER_SITE_OTHER): Payer: Medicare Other | Admitting: Family Medicine

## 2017-08-11 ENCOUNTER — Encounter: Payer: Self-pay | Admitting: Family Medicine

## 2017-08-11 VITALS — BP 120/70 | HR 53 | Temp 98.8°F | Ht 72.0 in | Wt 209.9 lb

## 2017-08-11 DIAGNOSIS — Z0000 Encounter for general adult medical examination without abnormal findings: Secondary | ICD-10-CM

## 2017-08-11 DIAGNOSIS — I10 Essential (primary) hypertension: Secondary | ICD-10-CM | POA: Diagnosis not present

## 2017-08-11 DIAGNOSIS — F411 Generalized anxiety disorder: Secondary | ICD-10-CM

## 2017-08-11 DIAGNOSIS — K219 Gastro-esophageal reflux disease without esophagitis: Secondary | ICD-10-CM

## 2017-08-11 MED ORDER — ALPRAZOLAM 1 MG PO TABS: 1.0000 mg | ORAL_TABLET | Freq: Three times a day (TID) | ORAL | 5 refills | Status: DC | PRN

## 2017-08-11 NOTE — Progress Notes (Signed)
Subjective:     Patient ID: Randall Lara, male   DOB: 30-Sep-1929, 84 y.o.   MRN: [id number]  HPI Patient seen for medical follow-up (and for Medicare Wellness visit) with our health coach. His chronic problems include history of chronic anxiety, hypertension, atrial fibrillation, and chronic insomnia. He is extremely anxious and for many years has been on alprazolam. We tried him on SSRIs in the past but did not tolerate these. He's been very reluctant to make any changes in his regimen. Medications reviewed. No recent falls. No headaches. No chest pains. Appetite and weight are stable.  Does not drink a lot of caffeine. No alcohol use. Does sometimes watch TV at night. Occasional naps.  Past Medical History:  Diagnosis Date  . Anxiety   . Arthritis   . Atrial fibrillation ([place])   . Depression   . Dysrhythmia    atrial fib  . GERD (gastroesophageal reflux disease)    no meds needed- diet controlled  . Heart palpitations   . Hypertension   . Joint pain   . Obesity    Past Surgical History:  Procedure Laterality Date  . CATARACT EXTRACTION Left   . CATARACT EXTRACTION W/PHACO Right 03/05/2016   Procedure: CATARACT EXTRACTION PHACO AND INTRAOCULAR LENS PLACEMENT ; CDE:  11.47;  Surgeon: Tonny Branch, MD;  Location: [facility];  Service: Ophthalmology;  Laterality: Right;  . ESOPHAGOGASTRODUODENOSCOPY  03/29/2002   [id number] esophagus, stomach, and duodenum through the second  portion.  . hernia repair Bilateral    Inguinal  . TOTAL KNEE ARTHROPLASTY Right 11/13/2015   Procedure: TOTAL KNEE ARTHROPLASTY;  Surgeon: Latanya Maudlin, MD;  Location: [facility];  Service: Orthopedics;  Laterality: Right;    reports that he quit smoking about 42 years ago. His smoking use included cigarettes. He started smoking about 62 years ago. He has a 20.00 pack-year smoking history. he has never used smokeless tobacco. He reports that he drinks alcohol. He reports that he does not use drugs. family history includes  Heart attack in his brother; Hypertension in his mother and sister; Stroke in his father, mother, and sister. Allergies  Allergen Reactions  . Ferrous Sulfate     REACTION: swelling, hives. Doesn't recall.  . Sulfa Antibiotics Hives and Swelling     Review of Systems  Constitutional: Negative for fatigue.  Eyes: Negative for visual disturbance.  Respiratory: Negative for cough, chest tightness and shortness of breath.   Cardiovascular: Negative for chest pain, palpitations and leg swelling.  Neurological: Negative for dizziness, syncope, weakness, light-headedness and headaches.  Psychiatric/Behavioral: Positive for sleep disturbance.       Objective:   Physical Exam  Constitutional: He is oriented to person, place, and time. He appears well-developed and well-nourished.  HENT:  Right Ear: External ear normal.  Left Ear: External ear normal.  Mouth/Throat: Oropharynx is clear and moist.  Eyes: Pupils are equal, round, and reactive to light.  Neck: Neck supple. No thyromegaly present.  Cardiovascular: Normal rate.  Pulmonary/Chest: Effort normal and breath sounds normal. No respiratory distress. He has no wheezes. He has no rales.  Musculoskeletal: He exhibits no edema.  Lymphadenopathy:    He has no cervical adenopathy.  Neurological: He is alert and oriented to person, place, and time. No cranial nerve deficit.       Assessment:     #1 hypertension- stable and at goal.    #2 atrial fibrillation.  Rate stable.   On Eliquis.  #3 chronic insomnia  #4 chronic  anxiety    Plan:     -sleep hygiene discussed with handout given -avoid bright lights at night such as TV -avoid daytime naps -consider low dose OTC Melatonin 1 to 3 mg qhs. -we have discussed risks of benzos many times in past.  Pt is very stressed/anxious at thought of tapering back.  Refills for 6 months.  [name] MD [facility] at [facility]

## 2017-08-11 NOTE — Patient Instructions (Addendum)
Randall Lara , Thank you for taking time to come for your Medicare Wellness Visit. I appreciate your ongoing commitment to your health goals. Please review the following plan we discussed and let me know if I can assist you in the future.  Shingrix is a vaccine for the prevention of Shingles in Adults 50 and older.  If you are on Medicare, you can request a prescription from your doctor to be filled at a pharmacy.  Please check with your benefits regarding applicable copays or out of pocket expenses.  The Shingrix is given in 2 vaccines approx 8 weeks apart. You must receive the 2nd dose prior to 6 months from receipt of the first.      These are the goals we discussed: Goals    . Patient Stated     Continues to engage in cars         This is a list of the screening recommended for you and due dates:  Health Maintenance  Topic Date Due  . Pneumonia vaccines (2 of 2 - PPSV23) 08/11/2018*  . Tetanus Vaccine  11/12/2019  . Flu Shot  Completed  *Topic was postponed. The date shown is not the original due date.    Health Maintenance, Male A healthy lifestyle and preventive care is important for your health and wellness. Ask your health care provider about what schedule of regular examinations is right for you. What should I know about weight and diet? Eat a Healthy Diet  Eat plenty of vegetables, fruits, whole grains, low-fat dairy products, and lean protein.  Do not eat a lot of foods high in solid fats, added sugars, or salt.  Maintain a Healthy Weight Regular exercise can help you achieve or maintain a healthy weight. You should:  Do at least 150 minutes of exercise each week. The exercise should increase your heart rate and make you sweat (moderate-intensity exercise).  Do strength-training exercises at least twice a week.  Watch Your Levels of Cholesterol and Blood Lipids  Have your blood tested for lipids and cholesterol every 5 years starting at 82 years of age. If you  are at high risk for heart disease, you should start having your blood tested when you are 82 years old. You may need to have your cholesterol levels checked more often if: ? Your lipid or cholesterol levels are high. ? You are older than 82 years of age. ? You are at high risk for heart disease.  What should I know about cancer screening? Many types of cancers can be detected early and may often be prevented. Lung Cancer  You should be screened every year for lung cancer if: ? You are a current smoker who has smoked for at least 30 years. ? You are a former smoker who has quit within the past 15 years.  Talk to your health care provider about your screening options, when you should start screening, and how often you should be screened.  Colorectal Cancer  Routine colorectal cancer screening usually begins at 82 years of age and should be repeated every 5-10 years until you are 82 years old. You may need to be screened more often if early forms of precancerous polyps or small growths are found. Your health care provider may recommend screening at an earlier age if you have risk factors for colon cancer.  Your health care provider may recommend using home test kits to check for hidden blood in the stool.  A small camera at the  end of a tube can be used to examine your colon (sigmoidoscopy or colonoscopy). This checks for the earliest forms of colorectal cancer.  Prostate and Testicular Cancer  Depending on your age and overall health, your health care provider may do certain tests to screen for prostate and testicular cancer.  Talk to your health care provider about any symptoms or concerns you have about testicular or prostate cancer.  Skin Cancer  Check your skin from head to toe regularly.  Tell your health care provider about any new moles or changes in moles, especially if: ? There is a change in a mole's size, shape, or color. ? You have a mole that is larger than a pencil  eraser.  Always use sunscreen. Apply sunscreen liberally and repeat throughout the day.  Protect yourself by wearing long sleeves, pants, a wide-brimmed hat, and sunglasses when outside.  What should I know about heart disease, diabetes, and high blood pressure?  If you are 75-75 years of age, have your blood pressure checked every 3-5 years. If you are 1 years of age or older, have your blood pressure checked every year. You should have your blood pressure measured twice-once when you are at a hospital or clinic, and once when you are not at a hospital or clinic. Record the average of the two measurements. To check your blood pressure when you are not at a hospital or clinic, you can use: ? An automated blood pressure machine at a pharmacy. ? A home blood pressure monitor.  Talk to your health care provider about your target blood pressure.  If you are between 18-51 years old, ask your health care provider if you should take aspirin to prevent heart disease.  Have regular diabetes screenings by checking your fasting blood sugar level. ? If you are at a normal weight and have a low risk for diabetes, have this test once every three years after the age of 47. ? If you are overweight and have a high risk for diabetes, consider being tested at a younger age or more often.  A one-time screening for abdominal aortic aneurysm (AAA) by ultrasound is recommended for men aged 16-75 years who are current or former smokers. What should I know about preventing infection? Hepatitis B If you have a higher risk for hepatitis B, you should be screened for this virus. Talk with your health care provider to find out if you are at risk for hepatitis B infection. Hepatitis C Blood testing is recommended for:  Everyone born from [birth year] through 1963.  Anyone with known risk factors for hepatitis C.  Sexually Transmitted Diseases (STDs)  You should be screened each year for STDs including gonorrhea and  chlamydia if: ? You are sexually active and are younger than 82 years of age. ? You are older than 82 years of age and your health care provider tells you that you are at risk for this type of infection. ? Your sexual activity has changed since you were last screened and you are at an increased risk for chlamydia or gonorrhea. Ask your health care provider if you are at risk.  Talk with your health care provider about whether you are at high risk of being infected with HIV. Your health care provider may recommend a prescription medicine to help prevent HIV infection.  What else can I do?  Schedule regular health, dental, and eye exams.  Stay current with your vaccines (immunizations).  Do not use any tobacco products, such as  cigarettes, chewing tobacco, and e-cigarettes. If you need help quitting, ask your health care provider.  Limit alcohol intake to no more than 2 drinks per day. One drink equals 12 ounces of beer, 5 ounces of wine, or 1 ounces of hard liquor.  Do not use street drugs.  Do not share needles.  Ask your health care provider for help if you need support or information about quitting drugs.  Tell your health care provider if you often feel depressed.  Tell your health care provider if you have ever been abused or do not feel safe at home. This information is not intended to replace advice given to you by your health care provider. Make sure you discuss any questions you have with your health care provider. Document Released: 12/26/2007 Document Revised: 02/26/2016 Document Reviewed: 04/02/2015 Elsevier Interactive Patient Education  2018 [place] in the Home Falls can cause injuries and can affect people from all age groups. There are many simple things that you can do to make your home safe and to help prevent falls. What can I do on the outside of my home?  Regularly repair the edges of walkways and driveways and fix any  cracks.  Remove high doorway thresholds.  Trim any shrubbery on the main path into your home.  Use bright outdoor lighting.  Clear walkways of debris and clutter, including tools and rocks.  Regularly check that handrails are securely fastened and in good repair. Both sides of any steps should have handrails.  Install guardrails along the edges of any raised decks or porches.  Have leaves, snow, and ice cleared regularly.  Use sand or salt on walkways during winter months.  In the garage, clean up any spills right away, including grease or oil spills. What can I do in the bathroom?  Use night lights.  Install grab bars by the toilet and in the tub and shower. Do not use towel bars as grab bars.  Use non-skid mats or decals on the floor of the tub or shower.  If you need to sit down while you are in the shower, use a plastic, non-slip stool.  Keep the floor dry. Immediately clean up any water that spills on the floor.  Remove soap buildup in the tub or shower on a regular basis.  Attach bath mats securely with double-sided non-slip rug tape.  Remove throw rugs and other tripping hazards from the floor. What can I do in the bedroom?  Use night lights.  Make sure that a bedside light is easy to reach.  Do not use oversized bedding that drapes onto the floor.  Have a firm chair that has side arms to use for getting dressed.  Remove throw rugs and other tripping hazards from the floor. What can I do in the kitchen?  Clean up any spills right away.  Avoid walking on wet floors.  Place frequently used items in easy-to-reach places.  If you need to reach for something above you, use a sturdy step stool that has a grab bar.  Keep electrical cables out of the way.  Do not use floor polish or wax that makes floors slippery. If you have to use wax, make sure that it is non-skid floor wax.  Remove throw rugs and other tripping hazards from the floor. What can I do in  the stairways?  Do not leave any items on the stairs.  Make sure that there are handrails on both sides of  the stairs. Fix handrails that are broken or loose. Make sure that handrails are as long as the stairways.  Check any carpeting to make sure that it is firmly attached to the stairs. Fix any carpet that is loose or worn.  Avoid having throw rugs at the top or bottom of stairways, or secure the rugs with carpet tape to prevent them from moving.  Make sure that you have a light switch at the top of the stairs and the bottom of the stairs. If you do not have them, have them installed. What are some other fall prevention tips?  Wear closed-toe shoes that fit well and support your feet. Wear shoes that have rubber soles or low heels.  When you use a stepladder, make sure that it is completely opened and that the sides are firmly locked. Have someone hold the ladder while you are using it. Do not climb a closed stepladder.  Add color or contrast paint or tape to grab bars and handrails in your home. Place contrasting color strips on the first and last steps.  Use mobility aids as needed, such as canes, walkers, scooters, and crutches.  Turn on lights if it is dark. Replace any light bulbs that burn out.  Set up furniture so that there are clear paths. Keep the furniture in the same spot.  Fix any uneven floor surfaces.  Choose a carpet design that does not hide the edge of steps of a stairway.  Be aware of any and all pets.  Review your medicines with your healthcare provider. Some medicines can cause dizziness or changes in blood pressure, which increase your risk of falling. Talk with your health care provider about other ways that you can decrease your risk of falls. This may include working with a physical therapist or trainer to improve your strength, balance, and endurance. This information is not intended to replace advice given to you by your health care provider. Make sure  you discuss any questions you have with your health care provider. Document Released: 06/19/2002 Document Revised: 11/26/2015 Document Reviewed: 08/03/2014 Elsevier Interactive Patient Education  2018 [employer].      Insomnia Insomnia is a sleep disorder that makes it difficult to fall asleep or to stay asleep. Insomnia can cause tiredness (fatigue), low energy, difficulty concentrating, mood swings, and poor performance at work or school. There are three different ways to classify insomnia:  Difficulty falling asleep.  Difficulty staying asleep.  Waking up too early in the morning.  Any type of insomnia can be long-term (chronic) or short-term (acute). Both are common. Short-term insomnia usually lasts for three months or less. Chronic insomnia occurs at least three times a week for longer than three months. What are the causes? Insomnia may be caused by another condition, situation, or substance, such as:  Anxiety.  Certain medicines.  Gastroesophageal reflux disease (GERD) or other gastrointestinal conditions.  Asthma or other breathing conditions.  Restless legs syndrome, sleep apnea, or other sleep disorders.  Chronic pain.  Menopause. This may include hot flashes.  Stroke.  Abuse of alcohol, tobacco, or illegal drugs.  Depression.  Caffeine.  Neurological disorders, such as Alzheimer disease.  An overactive thyroid (hyperthyroidism).  The cause of insomnia may not be known. What increases the risk? Risk factors for insomnia include:  Gender. Women are more commonly affected than men.  Age. Insomnia is more common as you get older.  Stress. This may involve your professional or personal life.  Income. Insomnia is  more common in people with lower income.  Lack of exercise.  Irregular work schedule or night shifts.  Traveling between different time zones.  What are the signs or symptoms? If you have insomnia, trouble falling asleep or trouble  staying asleep is the main symptom. This may lead to other symptoms, such as:  Feeling fatigued.  Feeling nervous about going to sleep.  Not feeling rested in the morning.  Having trouble concentrating.  Feeling irritable, anxious, or depressed.  How is this treated? Treatment for insomnia depends on the cause. If your insomnia is caused by an underlying condition, treatment will focus on addressing the condition. Treatment may also include:  Medicines to help you sleep.  Counseling or therapy.  Lifestyle adjustments.  Follow these instructions at home:  Take medicines only as directed by your health care provider.  Keep regular sleeping and waking hours. Avoid naps.  Keep a sleep diary to help you and your health care provider figure out what could be causing your insomnia. Include: ? When you sleep. ? When you wake up during the night. ? How well you sleep. ? How rested you feel the next day. ? Any side effects of medicines you are taking. ? What you eat and drink.  Make your bedroom a comfortable place where it is easy to fall asleep: ? Put up shades or special blackout curtains to block light from outside. ? Use a white noise machine to block noise. ? Keep the temperature cool.  Exercise regularly as directed by your health care provider. Avoid exercising right before bedtime.  Use relaxation techniques to manage stress. Ask your health care provider to suggest some techniques that may work well for you. These may include: ? Breathing exercises. ? Routines to release muscle tension. ? Visualizing peaceful scenes.  Cut back on alcohol, caffeinated beverages, and cigarettes, especially close to bedtime. These can disrupt your sleep.  Do not overeat or eat spicy foods right before bedtime. This can lead to digestive discomfort that can make it hard for you to sleep.  Limit screen use before bedtime. This includes: ? Watching TV. ? Using your smartphone, tablet,  and computer.  Stick to a routine. This can help you fall asleep faster. Try to do a quiet activity, brush your teeth, and go to bed at the same time each night.  Get out of bed if you are still awake after 15 minutes of trying to sleep. Keep the lights down, but try reading or doing a quiet activity. When you feel sleepy, go back to bed.  Make sure that you drive carefully. Avoid driving if you feel very sleepy.  Keep all follow-up appointments as directed by your health care provider. This is important. Contact a health care provider if:  You are tired throughout the day or have trouble in your daily routine due to sleepiness.  You continue to have sleep problems or your sleep problems get worse. Get help right away if:  You have serious thoughts about hurting yourself or someone else. This information is not intended to replace advice given to you by your health care provider. Make sure you discuss any questions you have with your health care provider. Document Released: 06/26/2000 Document Revised: 11/29/2015 Document Reviewed: 03/30/2014 Elsevier Interactive Patient Education  2018 [employer].  Consider Melatonin 1 mg at night and may titrate up to 3 mg at night.

## 2017-08-11 NOTE — Progress Notes (Signed)
Subjective:   SABAN HEINLEN is a 84 y.o. male who presents for Medicare Annual/Subsequent preventive examination.  Report health as very good  Diet  Eats good  Wants to lose weight (lost 50 lbs 3 to 4 years )  No issues with appetite but tries to watch portions   BMI 28.3  Exercise:  Cardiac Risk Factors include: advanced age (>29men, >8 women);hypertensionDoes  not get a lot Enjoys working outside   Married x 50 years  One level home  Has walk in shower  Has not fallen    There are no preventive care reminders to display for this patient.     Objective:    Vitals: BP 120/70 (BP Location: Right Arm, Patient Position: Sitting, Cuff Size: Large)   Pulse (!) 53   Temp 98.8 F (37.1 C) (Oral)   Ht 6' (1.829 m)   Wt 209 lb 14.4 oz (95.2 kg)   SpO2 96%   BMI 28.47 kg/m   Body mass index is 28.47 kg/m.  Advanced Directives 08/11/2017 02/28/2016 12/03/2015 11/13/2015 11/13/2015 11/05/2015  Does Patient Have a Medical Advance Directive? Yes Yes Yes Yes - Yes  Type of Advance Directive - Living will Living will Living will - Living will  Does patient want to make changes to medical advance directive? - No - Patient declined - No - Patient declined - No - Patient declined  Copy of [place] in Chart? - Yes - No - copy requested No - copy requested No - copy requested    Tobacco Social History   Tobacco Use  Smoking Status Former Smoker  . Packs/day: 1.00  . Years: 20.00  . Pack years: 20.00  . Types: Cigarettes  . Start date: 08/07/1955  . Last attempt to quit: 08/07/1975  . Years since quitting: 42.0  Smokeless Tobacco Never Used     Counseling given: Yes   Clinical Intake:       Past Medical History:  Diagnosis Date  . Anxiety   . Arthritis   . Atrial fibrillation ([place])   . Depression   . Dysrhythmia    atrial fib  . GERD (gastroesophageal reflux disease)    no meds needed- diet controlled  . Heart palpitations   . Hypertension   .  Joint pain   . Obesity    Past Surgical History:  Procedure Laterality Date  . CATARACT EXTRACTION Left   . CATARACT EXTRACTION W/PHACO Right 03/05/2016   Procedure: CATARACT EXTRACTION PHACO AND INTRAOCULAR LENS PLACEMENT ; CDE:  11.47;  Surgeon: Tonny Branch, MD;  Location: [facility];  Service: Ophthalmology;  Laterality: Right;  . ESOPHAGOGASTRODUODENOSCOPY  03/29/2002   XBM:WUXLKG esophagus, stomach, and duodenum through the second  portion.  . hernia repair Bilateral    Inguinal  . TOTAL KNEE ARTHROPLASTY Right 11/13/2015   Procedure: TOTAL KNEE ARTHROPLASTY;  Surgeon: Latanya Maudlin, MD;  Location: [facility];  Service: Orthopedics;  Laterality: Right;   Family History  Problem Relation Age of Onset  . Stroke Mother   . Hypertension Mother   . Stroke Father   . Stroke Sister   . Heart attack Brother   . Hypertension Sister   . Colon cancer Neg Hx    Social History   Socioeconomic History  . Marital status: Married    Spouse name: None  . Number of children: None  . Years of education: None  . Highest education level: None  Social Needs  . Financial resource strain: None  .  Food insecurity - worry: None  . Food insecurity - inability: None  . Transportation needs - medical: None  . Transportation needs - non-medical: None  Occupational History  . None  Tobacco Use  . Smoking status: Former Smoker    Packs/day: 1.00    Years: 20.00    Pack years: 20.00    Types: Cigarettes    Start date: 08/07/1955    Last attempt to quit: 08/07/1975    Years since quitting: 42.0  . Smokeless tobacco: Never Used  Substance and Sexual Activity  . Alcohol use: Yes    Alcohol/week: 0.0 oz    Comment: occassional  . Drug use: No  . Sexual activity: Yes    Birth control/protection: None  Other Topics Concern  . None  Social History Narrative  . None    Outpatient Encounter Medications as of 08/11/2017  Medication Sig  . ALPRAZolam (XANAX) 1 MG tablet Take 1 tablet (1 mg total) by  mouth 3 (three) times daily as needed.  . benazepril-hydrochlorthiazide (LOTENSIN HCT) 20-12.5 MG tablet TAKE 1 TABLET BY MOUTH DAILY.  Marland Kitchen ELIQUIS 5 MG TABS tablet TAKE 1 TABLET BY MOUTH TWICE DAILY  . mirtazapine (REMERON) 15 MG tablet TAKE ONE TABLET BY MOUTH AT BEDTIME  . nitroGLYCERIN (NITROSTAT) 0.4 MG SL tablet Place 1 tablet (0.4 mg total) under the tongue every 5 (five) minutes as needed for chest pain.  Marland Kitchen nystatin-triamcinolone ointment (MYCOLOG) Apply 1 application topically 2 (two) times daily. (Patient taking differently: Apply 1 application topically 2 (two) times daily as needed. )  . [DISCONTINUED] ALPRAZolam (XANAX) 1 MG tablet TAKE ONE TABLET BY MOUTH THREE TIMES DAILY AS NEEDED   No facility-administered encounter medications on file as of 08/11/2017.     Activities of Daily Living In your present state of health, do you have any difficulty performing the following activities: 08/11/2017  Hearing? N  Vision? N  Difficulty concentrating or making decisions? N  Walking or climbing stairs? Y  Dressing or bathing? N  Doing errands, shopping? N  Preparing Food and eating ? N  Using the Toilet? N  In the past six months, have you accidently leaked urine? N  Do you have problems with loss of bowel control? N  Managing your Medications? N  Managing your Finances? N  Housekeeping or managing your Housekeeping? N  Some recent data might be hidden    Patient Care Team: [name], MD as PCP - General Gala Romney Cristopher Estimable, MD as Attending Physician (Gastroenterology)   Assessment:   This is a routine wellness examination for Armany.  Exercise Activities and Dietary recommendations Current Exercise Habits: Home exercise routine  Goals    . Patient Stated     Continues to engage in cars         Fall Risk Fall Risk  08/11/2017 08/11/2017 02/08/2017 05/20/2015 05/15/2014  Falls in the past year? No No No Yes No  Number falls in past yr: - - - 1 -  Injury with Fall? - - -  Yes -  Risk for fall due to : - - - Other (Comment) -  Follow up - - - Falls prevention discussed;Follow up appointment -     Depression Screen PHQ 2/9 Scores 02/08/2017 05/20/2015 05/15/2014 05/15/2014  PHQ - 2 Score 0 6 0 0  PHQ- 9 Score - 12 - -    Cognitive Function     Ad8 score reviewed for issues:  Issues making decisions:  Less interest  in hobbies / activities:  Repeats questions, stories (family complaining):  Trouble using ordinary gadgets (microwave, computer, phone):  Forgets the month or year:   Mismanaging finances:   Remembering appts:  Daily problems with thinking and/or memory: Ad8 score is=0    Immunization History  Administered Date(s) Administered  . Influenza Split 05/08/2011, 05/08/2013  . Influenza-Unspecified 05/09/2014, 04/30/2015, 05/02/2016, 04/12/2017  . Pneumococcal Conjugate-13 07/14/2003, 05/15/2014  . Td 11/11/2009      Screening Tests Health Maintenance  Topic Date Due  . PNA vac Low Risk Adult (2 of 2 - PPSV23) 08/11/2018 (Originally 05/16/2015)  . TETANUS/TDAP  11/12/2019  . INFLUENZA VACCINE  Completed        Plan:      PCP Notes   Health Maintenance Mr. Poorman stated he had his pneumonia vaccines and does not want to take one today;    Abnormal Screens  HOH but has hearing aids   Referrals  None    Patient concerns; The patient was asked about depression. States his sister committed suicide and he buried her on Christmas Eve. Lost other close friends and admits to depression often, but states he "gets over it". Started to complete the Depression scale, but he got teary and started to cry but then quickly managed. Recommended counseling for grief and he declined.  He would become very sad and would spontaneously change mood. Declined counseling but will call if needed. PHq9 waived due to not escalating his sadness. Enjoys his wife's company and buying new cars.    Nurse Concerns; As noted   Next PCP  apt Was seen today       I have personally reviewed and noted the following in the patient's chart:   . Medical and social history . Use of alcohol, tobacco or illicit drugs  . Current medications and supplements . Functional ability and status . Nutritional status . Physical activity . Advanced directives . List of other physicians . Hospitalizations, surgeries, and ER visits in previous 12 months . Vitals . Screenings to include cognitive, depression, and falls . Referrals and appointments  In addition, I have reviewed and discussed with patient certain preventive protocols, quality metrics, and best practice recommendations. A written personalized care plan for preventive services as well as general preventive health recommendations were provided to patient.     SWNIO,EVOJJ, RN  08/11/2017  Reviewed assessment as above and agree  [name] MD [facility] at [facility]

## 2017-08-16 DIAGNOSIS — R350 Frequency of micturition: Secondary | ICD-10-CM | POA: Diagnosis not present

## 2017-08-16 DIAGNOSIS — N401 Enlarged prostate with lower urinary tract symptoms: Secondary | ICD-10-CM | POA: Diagnosis not present

## 2017-08-16 DIAGNOSIS — R3911 Hesitancy of micturition: Secondary | ICD-10-CM | POA: Diagnosis not present

## 2017-09-20 ENCOUNTER — Ambulatory Visit: Payer: Medicare Other | Admitting: Cardiovascular Disease

## 2017-09-20 ENCOUNTER — Encounter: Payer: Self-pay | Admitting: Cardiovascular Disease

## 2017-09-20 VITALS — BP 100/60 | HR 62 | Ht 72.0 in | Wt 209.0 lb

## 2017-09-20 DIAGNOSIS — I482 Chronic atrial fibrillation: Secondary | ICD-10-CM | POA: Diagnosis not present

## 2017-09-20 DIAGNOSIS — I359 Nonrheumatic aortic valve disorder, unspecified: Secondary | ICD-10-CM | POA: Diagnosis not present

## 2017-09-20 DIAGNOSIS — I4821 Permanent atrial fibrillation: Secondary | ICD-10-CM

## 2017-09-20 DIAGNOSIS — I10 Essential (primary) hypertension: Secondary | ICD-10-CM

## 2017-09-20 NOTE — Patient Instructions (Signed)

## 2017-09-20 NOTE — Progress Notes (Signed)
SUBJECTIVE: The patient presents for follow-up of permanent atrial fibrillation. Echocardiogram on 07/01/10 demonstrated normal left ventricular systolic function, EF 51-88%, with mild aortic regurgitation. He has no known history of ischemic heart disease and underwent a low risk nuclear stress test on 07/07/13.   He is not on AV nodal blocking agents.   He likes to stay active in his garden.  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  The patient denies any symptoms of chest pain, palpitations, shortness of breath, lightheadedness, dizziness, leg swelling, orthopnea, PND, and syncope.  I reviewed an ECG performed in our office today which demonstrated atrial fibrillation with PVCs, heart rate 65 bpm.   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 They used to own a home at [facility] and vacation there every summer.   Review of Systems: As per "subjective", otherwise negative.  Allergies  Allergen Reactions  . Ferrous Sulfate     REACTION: swelling, hives. Doesn't recall.  . Sulfa Antibiotics Hives and Swelling    Current Outpatient Medications  Medication Sig Dispense Refill  . ALPRAZolam (XANAX) 1 MG tablet Take 1 tablet (1 mg total) by mouth 3 (three) times daily as needed. 90 tablet 5  . benazepril-hydrochlorthiazide (LOTENSIN HCT) 20-12.5 MG tablet TAKE 1 TABLET BY MOUTH DAILY. 90 tablet 1  . ELIQUIS 5 MG TABS tablet TAKE 1 TABLET BY MOUTH TWICE DAILY 60 tablet 3  . mirtazapine (REMERON) 15 MG tablet TAKE ONE TABLET BY MOUTH AT BEDTIME 30 tablet 5  . nitroGLYCERIN (NITROSTAT) 0.4 MG SL tablet Place 1 tablet (0.4 mg total) under the tongue every 5 (five) minutes as needed for chest pain. 25 tablet 11  . nystatin-triamcinolone ointment (MYCOLOG) Apply 1  application topically 2 (two) times daily. (Patient taking differently: Apply 1 application topically 2 (two) times daily as needed. ) 30 g 3   No current facility-administered medications for this visit.     Past Medical History:  Diagnosis Date  . Anxiety   . Arthritis   . Atrial fibrillation ([place])   . Depression   . Dysrhythmia    atrial fib  . GERD (gastroesophageal reflux disease)    no meds needed- diet controlled  . Heart palpitations   . Hypertension   . Joint pain   . Obesity     Past Surgical History:  Procedure Laterality Date  . CATARACT EXTRACTION Left   . CATARACT EXTRACTION W/PHACO Right 03/05/2016   Procedure: CATARACT EXTRACTION PHACO AND INTRAOCULAR LENS PLACEMENT ; CDE:  11.47;  Surgeon: Tonny Branch, MD;  Location: [facility];  Service: Ophthalmology;  Laterality: Right;  . ESOPHAGOGASTRODUODENOSCOPY  03/29/2002   CZY:SAYTKZ esophagus, stomach, and duodenum through the second  portion.  . hernia repair Bilateral    Inguinal  . TOTAL KNEE ARTHROPLASTY Right 11/13/2015   Procedure: TOTAL KNEE ARTHROPLASTY;  Surgeon: Latanya Maudlin, MD;  Location: [facility];  Service: Orthopedics;  Laterality: Right;    Social History   Socioeconomic History  . Marital status: Married    Spouse name: Not on file  . Number of children: Not on file  . Years of education: Not on file  . Highest education level: Not on file  Social Needs  . Financial resource strain: Not on  file  . Food insecurity - worry: Not on file  . Food insecurity - inability: Not on file  . Transportation needs - medical: Not on file  . Transportation needs - non-medical: Not on file  Occupational History  . Not on file  Tobacco Use  . Smoking status: Former Smoker    Packs/day: 1.00    Years: 20.00    Pack years: 20.00    Types: Cigarettes    Start date: 08/07/1955    Last attempt to quit: 08/07/1975    Years since quitting: 42.1  . Smokeless tobacco: Never Used  Substance and Sexual Activity  .  Alcohol use: Yes    Alcohol/week: 0.0 oz    Comment: occassional  . Drug use: No  . Sexual activity: Yes    Birth control/protection: None  Other Topics Concern  . Not on file  Social History Narrative  . Not on file     Vitals:   09/20/17 1034  BP: 100/60  Pulse: 62  SpO2: 98%  Weight: 209 lb (94.8 kg)  Height: 6' (1.829 m)    Wt Readings from Last 3 Encounters:  09/20/17 209 lb (94.8 kg)  08/11/17 209 lb 14.4 oz (95.2 kg)  02/08/17 205 lb 1.6 oz (93 kg)     PHYSICAL EXAM General: NAD HEENT: Normal. Neck: No JVD, no thyromegaly. Lungs: Clear to auscultation bilaterally with normal respiratory effort. CV: Regular rate and irregular rhythm, normal S1/S2, no S3, 1/6 systolic murmur over bilateral upper sternal borders and apex. No pretibial or periankle edema.  No carotid bruit.   Abdomen: Soft, nontender, no distention.  Neurologic: Alert and oriented.  Psych: Normal affect. Skin: Normal. Musculoskeletal: No gross deformities.    ECG: Most recent ECG reviewed.   Labs: Lab Results  Component Value Date/Time   K 3.8 02/08/2017 01:31 PM   BUN 16 02/08/2017 01:31 PM   CREATININE 0.99 02/08/2017 01:31 PM   CREATININE 0.98 09/09/2015 11:15 AM   ALT 21 11/05/2015 09:05 AM   TSH 0.76 08/26/2010 10:09 AM   HGB 13.7 02/08/2017 01:31 PM     Lipids: No results found for: LDLCALC, LDLDIRECT, CHOL, TRIG, HDL     ASSESSMENT AND PLAN: 1. Permanent atrial fibrillation:  Symptomatically stable.   Systemically anticoagulated with Eliquis.  Heart rate is normal without AV nodal blocking agents, indicative of conduction system disease.  No change to therapy.  2.  Chronic hypertension: Blood pressure is normal.  No changes to therapy.     Disposition: Follow up 1 year   Kate Sable, M.D., F.A.C.C.

## 2017-09-28 ENCOUNTER — Ambulatory Visit: Payer: Medicare Other | Admitting: Urology

## 2017-12-07 ENCOUNTER — Other Ambulatory Visit: Payer: Self-pay | Admitting: Family Medicine

## 2017-12-07 ENCOUNTER — Other Ambulatory Visit: Payer: Self-pay | Admitting: Cardiovascular Disease

## 2017-12-28 DIAGNOSIS — H524 Presbyopia: Secondary | ICD-10-CM | POA: Diagnosis not present

## 2017-12-28 DIAGNOSIS — H04122 Dry eye syndrome of left lacrimal gland: Secondary | ICD-10-CM | POA: Diagnosis not present

## 2018-01-09 ENCOUNTER — Other Ambulatory Visit: Payer: Self-pay | Admitting: Family Medicine

## 2018-01-10 ENCOUNTER — Ambulatory Visit (INDEPENDENT_AMBULATORY_CARE_PROVIDER_SITE_OTHER): Payer: Medicare Other | Admitting: Family Medicine

## 2018-01-10 ENCOUNTER — Encounter: Payer: Self-pay | Admitting: Family Medicine

## 2018-01-10 VITALS — BP 108/70 | HR 64 | Temp 98.0°F | Wt 205.1 lb

## 2018-01-10 DIAGNOSIS — F411 Generalized anxiety disorder: Secondary | ICD-10-CM | POA: Diagnosis not present

## 2018-01-10 DIAGNOSIS — I10 Essential (primary) hypertension: Secondary | ICD-10-CM

## 2018-01-10 DIAGNOSIS — Z8679 Personal history of other diseases of the circulatory system: Secondary | ICD-10-CM | POA: Diagnosis not present

## 2018-01-10 MED ORDER — MIRTAZAPINE 30 MG PO TBDP: 30.0000 mg | ORAL_TABLET | Freq: Every day | ORAL | 5 refills | Status: DC

## 2018-01-10 NOTE — Progress Notes (Signed)
Subjective:     Patient ID: Randall Lara, male   DOB: [birth year], 84 y.o.   MRN: [id number]  HPI Patient seen for medical follow-up. He seems very anxious today. He states his wife was just diagnosed few weeks ago with Alzheimer's dementia. He is extremely stressed over this. He states he has been unable to focus on much of anything else since then. Have difficulty with sleep at night.  Already has some chronic anxiety no Xanax 1 mg 3 times a day for many years. Also takes Remeron at night. He's had some depressed mood.  Hypertension treated with benazepril HCTZ. He had repeat elevated at 198/110 several days ago but none since that time.  He states he lost 10 pounds due to stress issues. He is stressing over possible financial stressors if his wife has to go into a nursing home eventually  Past Medical History:  Diagnosis Date  . Anxiety   . Arthritis   . Atrial fibrillation ([place])   . Depression   . Dysrhythmia    atrial fib  . GERD (gastroesophageal reflux disease)    no meds needed- diet controlled  . Heart palpitations   . Hypertension   . Joint pain   . Obesity    Past Surgical History:  Procedure Laterality Date  . CATARACT EXTRACTION Left   . CATARACT EXTRACTION W/PHACO Right 03/05/2016   Procedure: CATARACT EXTRACTION PHACO AND INTRAOCULAR LENS PLACEMENT ; CDE:  11.47;  Surgeon: Tonny Branch, MD;  Location: [facility];  Service: Ophthalmology;  Laterality: Right;  . ESOPHAGOGASTRODUODENOSCOPY  03/29/2002   MGQ:QPYPPJ esophagus, stomach, and duodenum through the second  portion.  . hernia repair Bilateral    Inguinal  . TOTAL KNEE ARTHROPLASTY Right 11/13/2015   Procedure: TOTAL KNEE ARTHROPLASTY;  Surgeon: Latanya Maudlin, MD;  Location: [facility];  Service: Orthopedics;  Laterality: Right;    reports that he quit smoking about 42 years ago. His smoking use included cigarettes. He started smoking about 62 years ago. He has a 20.00 pack-year smoking history. He has never used  smokeless tobacco. He reports that he drinks alcohol. He reports that he does not use drugs. family history includes Heart attack in his brother; Hypertension in his mother and sister; Stroke in his father, mother, and sister. Allergies  Allergen Reactions  . Ferrous Sulfate     REACTION: swelling, hives. Doesn't recall.  . Sulfa Antibiotics Hives and Swelling     Review of Systems  Constitutional: Positive for appetite change. Negative for chills and fever.  Respiratory: Negative for shortness of breath.   Cardiovascular: Negative for chest pain.  Gastrointestinal: Negative for abdominal pain.  Neurological: Negative for headaches.  Psychiatric/Behavioral: Positive for dysphoric mood and sleep disturbance. Negative for suicidal ideas. The patient is nervous/anxious.        Objective:   Physical Exam  Constitutional: He is oriented to person, place, and time. He appears well-developed and well-nourished.  Patient somewhat anxious in appearance but in no distress  Cardiovascular: Normal rate and regular rhythm.  Pulmonary/Chest: Effort normal and breath sounds normal.  Musculoskeletal: He exhibits no edema.  Neurological: He is alert and oriented to person, place, and time. No cranial nerve deficit.       Assessment:     #1 hypertension stable and at goal but some recent elevated readings at home. Probably exacerbated by recent stress issues  #2 long-standing history of chronic anxiety. We tried many times the past tapering him off Xanax without success.  #  3 situational stressors with wife's recently diagnosis of dementia    Plan:     -Discussed possible role for counseling at this point he declines -Increase Remeron to 30 mg daily at bedtime -Continue current dosage of blood pressure medication. He had questions about increasing this but reluctance with in office reading today 108/70 and especially in view of recent weight loss. -We've encouraged him to try to redirect his  thoughts and get his mind off negative things much as possible  [name] MD [facility] at [facility]

## 2018-01-10 NOTE — Patient Instructions (Signed)
Stress and Stress Management Stress is a normal reaction to life events. It is what you feel when life demands more than you are used to or more than you can handle. Some stress can be useful. For example, the stress reaction can help you catch the last bus of the day, study for a test, or meet a deadline at work. But stress that occurs too often or for too long can cause problems. It can affect your emotional health and interfere with relationships and normal daily activities. Too much stress can weaken your immune system and increase your risk for physical illness. If you already have a medical problem, stress can make it worse. What are the causes? All sorts of life events may cause stress. An event that causes stress for one person may not be stressful for another person. Major life events commonly cause stress. These may be positive or negative. Examples include losing your job, moving into a new home, getting married, having a baby, or losing a loved one. Less obvious life events may also cause stress, especially if they occur day after day or in combination. Examples include working long hours, driving in traffic, caring for children, being in debt, or being in a difficult relationship. What are the signs or symptoms? Stress may cause emotional symptoms including, the following:  Anxiety. This is feeling worried, afraid, on edge, overwhelmed, or out of control.  Anger. This is feeling irritated or impatient.  Depression. This is feeling sad, down, helpless, or guilty.  Difficulty focusing, remembering, or making decisions.  Stress may cause physical symptoms, including the following:  Aches and pains. These may affect your head, neck, back, stomach, or other areas of your body.  Tight muscles or clenched jaw.  Low energy or trouble sleeping.  Stress may cause unhealthy behaviors, including the following:  Eating to feel better (overeating) or skipping meals.  Sleeping too little,  too much, or both.  Working too much or putting off tasks (procrastination).  Smoking, drinking alcohol, or using drugs to feel better.  How is this diagnosed? Stress is diagnosed through an assessment by your health care provider. Your health care provider will ask questions about your symptoms and any stressful life events.Your health care provider will also ask about your medical history and may order blood tests or other tests. Certain medical conditions and medicine can cause physical symptoms similar to stress. Mental illness can cause emotional symptoms and unhealthy behaviors similar to stress. Your health care provider may refer you to a mental health professional for further evaluation. How is this treated? Stress management is the recommended treatment for stress.The goals of stress management are reducing stressful life events and coping with stress in healthy ways. Techniques for reducing stressful life events include the following:  Stress identification. Self-monitor for stress and identify what causes stress for you. These skills may help you to avoid some stressful events.  Time management. Set your priorities, keep a calendar of events, and learn to say "no." These tools can help you avoid making too many commitments.  Techniques for coping with stress include the following:  Rethinking the problem. Try to think realistically about stressful events rather than ignoring them or overreacting. Try to find the positives in a stressful situation rather than focusing on the negatives.  Exercise. Physical exercise can release both physical and emotional tension. The key is to find a form of exercise you enjoy and do it regularly.  Relaxation techniques. These relax the body and  mind. Examples include yoga, meditation, tai chi, biofeedback, deep breathing, progressive muscle relaxation, listening to music, being out in nature, journaling, and other hobbies. Again, the key is to find  one or more that you enjoy and can do regularly.  Healthy lifestyle. Eat a balanced diet, get plenty of sleep, and do not smoke. Avoid using alcohol or drugs to relax.  Strong support network. Spend time with family, friends, or other people you enjoy being around.Express your feelings and talk things over with someone you trust.  Counseling or talktherapy with a mental health professional may be helpful if you are having difficulty managing stress on your own. Medicine is typically not recommended for the treatment of stress.Talk to your health care provider if you think you need medicine for symptoms of stress. Follow these instructions at home:  Keep all follow-up visits as directed by your health care provider.  Take all medicines as directed by your health care provider. Contact a health care provider if:  Your symptoms get worse or you start having new symptoms.  You feel overwhelmed by your problems and can no longer manage them on your own. Get help right away if:  You feel like hurting yourself or someone else. This information is not intended to replace advice given to you by your health care provider. Make sure you discuss any questions you have with your health care provider. Document Released: 12/23/2000 Document Revised: 12/05/2015 Document Reviewed: 02/21/2013 Elsevier Interactive Patient Education  2017 [employer]  

## 2018-01-19 ENCOUNTER — Other Ambulatory Visit: Payer: Self-pay | Admitting: *Deleted

## 2018-01-19 MED ORDER — NITROGLYCERIN 0.4 MG SL SUBL: 0.4000 mg | SUBLINGUAL_TABLET | SUBLINGUAL | 3 refills | Status: DC | PRN

## 2018-02-08 ENCOUNTER — Other Ambulatory Visit: Payer: Self-pay | Admitting: Family Medicine

## 2018-02-08 NOTE — Telephone Encounter (Signed)
Refill for 6 months. 

## 2018-02-08 NOTE — Telephone Encounter (Signed)
Last refill 08/11/17. Last office visit 01/10/18.  Okay to fill?

## 2018-03-09 ENCOUNTER — Ambulatory Visit (INDEPENDENT_AMBULATORY_CARE_PROVIDER_SITE_OTHER): Payer: Medicare Other | Admitting: Family Medicine

## 2018-03-09 ENCOUNTER — Encounter: Payer: Self-pay | Admitting: Family Medicine

## 2018-03-09 VITALS — BP 120/84 | HR 51 | Temp 97.6°F | Wt 212.0 lb

## 2018-03-09 DIAGNOSIS — F411 Generalized anxiety disorder: Secondary | ICD-10-CM | POA: Diagnosis not present

## 2018-03-09 DIAGNOSIS — F322 Major depressive disorder, single episode, severe without psychotic features: Secondary | ICD-10-CM | POA: Diagnosis not present

## 2018-03-09 MED ORDER — BUPROPION HCL ER (XL) 150 MG PO TB24: 150.0000 mg | ORAL_TABLET | Freq: Every day | ORAL | 3 refills | Status: DC

## 2018-03-09 NOTE — Progress Notes (Signed)
Subjective:     Patient ID: Randall Lara, male   DOB: 05-Jan-1930, 84 y.o.   MRN: [id number]  HPI Patient seen for follow-up to discuss depression symptoms. His wife had a fall several weeks ago and she has had a slow recovery. She apparently handles all the finances and he was extremely stressed out as she was unable to handle those issues for several weeks. She is gradually recovering. Patient has a long history of anxiety and has had several weeks of progressive depression symptoms. We recently increased his Remeron to 30 mg last visit. He still has poor sleep at night at times.  He has low motivation. Difficulty focusing. Loss of interest in activities. He has managed to gain back 7 pounds since last visit. Denies any active suicidal ideation. He did have a sister who committed suicide several years ago.  He does have history of chronic anxiety. We've tried many times to get him off Xanax or at least taper back but he has been very resistant. He denies any recent falls.  Past Medical History:  Diagnosis Date  . Anxiety   . Arthritis   . Atrial fibrillation ([place])   . Depression   . Dysrhythmia    atrial fib  . GERD (gastroesophageal reflux disease)    no meds needed- diet controlled  . Heart palpitations   . Hypertension   . Joint pain   . Obesity    Past Surgical History:  Procedure Laterality Date  . CATARACT EXTRACTION Left   . CATARACT EXTRACTION W/PHACO Right 03/05/2016   Procedure: CATARACT EXTRACTION PHACO AND INTRAOCULAR LENS PLACEMENT ; CDE:  11.47;  Surgeon: Tonny Branch, MD;  Location: [facility];  Service: Ophthalmology;  Laterality: Right;  . ESOPHAGOGASTRODUODENOSCOPY  03/29/2002   TIR:WERXVQ esophagus, stomach, and duodenum through the second  portion.  . hernia repair Bilateral    Inguinal  . TOTAL KNEE ARTHROPLASTY Right 11/13/2015   Procedure: TOTAL KNEE ARTHROPLASTY;  Surgeon: Latanya Maudlin, MD;  Location: [facility];  Service: Orthopedics;  Laterality: Right;    reports that he quit smoking about 42 years ago. His smoking use included cigarettes. He started smoking about 62 years ago. He has a 20.00 pack-year smoking history. He has never used smokeless tobacco. He reports that he drinks alcohol. He reports that he does not use drugs. family history includes Heart attack in his brother; Hypertension in his mother and sister; Stroke in his father, mother, and sister. Allergies  Allergen Reactions  . Ferrous Sulfate     REACTION: swelling, hives. Doesn't recall.  . Sulfa Antibiotics Hives and Swelling    Review of Systems  Constitutional: Positive for fatigue. Negative for appetite change and unexpected weight change.  Respiratory: Negative for shortness of breath.   Cardiovascular: Negative for chest pain.  Genitourinary: Negative for dysuria.  Neurological: Negative for syncope and headaches.  Psychiatric/Behavioral: Positive for dysphoric mood and sleep disturbance. Negative for agitation, behavioral problems, confusion and suicidal ideas.       Objective:   Physical Exam  Constitutional: He is oriented to person, place, and time. He appears well-developed and well-nourished.  Cardiovascular: Normal rate and regular rhythm.  Pulmonary/Chest: Effort normal and breath sounds normal.  Neurological: He is alert and oriented to person, place, and time. No cranial nerve deficit.  Psychiatric:  PHQ-9=23         Assessment:     Major depressive episode-severe with PHQ-9 score of 23. No suicidal ideation currently.  He has some chronic  anxiety at baseline    Plan:     -he already takes Remeron 30 mg at night. He had specific questions about Lexapro. We explained we would not want to mix SSRI medication with his Remeron for increased risk of serotonin syndrome. -He has had low motivation and low energy and we have agreed to cautious trial of low-dose Wellbutrin XL 150 mg daily and he will remain on Remeron at night. He is aware this may take a  few weeks to have significant effects -Offered counseling but he declines at this time -He is encouraged to stay engaged in activities as much as possible -Reassess in 3 weeks  [name] MD [facility] at [facility]

## 2018-03-09 NOTE — Patient Instructions (Signed)
Start the Wellbutrin one daily at breakfast.  Would remain on the Remeron at night.   Set up 3 week follow up (15 minute follow up OK)

## 2018-03-18 ENCOUNTER — Other Ambulatory Visit: Payer: Self-pay | Admitting: Family Medicine

## 2018-03-21 ENCOUNTER — Ambulatory Visit (INDEPENDENT_AMBULATORY_CARE_PROVIDER_SITE_OTHER): Payer: Medicare Other | Admitting: Family Medicine

## 2018-03-21 ENCOUNTER — Ambulatory Visit: Payer: Self-pay

## 2018-03-21 ENCOUNTER — Encounter: Payer: Self-pay | Admitting: Family Medicine

## 2018-03-21 VITALS — BP 122/60 | HR 47 | Temp 97.5°F | Ht 72.0 in | Wt 211.7 lb

## 2018-03-21 DIAGNOSIS — F411 Generalized anxiety disorder: Secondary | ICD-10-CM

## 2018-03-21 DIAGNOSIS — I10 Essential (primary) hypertension: Secondary | ICD-10-CM

## 2018-03-21 NOTE — Progress Notes (Signed)
Subjective:     Patient ID: Randall Lara, male   DOB: [birth year], 84 y.o.   MRN: [id number]  HPI Patient is here with concern for elevated blood pressure. He states that yesterday his blood pressure was up as high as 190/80. He states that he's had some increased anxiety since his wife was diagnosed with early Alzheimer's disease. He also did some lifting with project at home and had some low back pain and has been taking Tylenol along with some Advil and Aleve. He takes eliquis daily and knows he should not be taking nonsteroidals. Denies any dietary changes. No regular alcohol use.  No ETOH use.    Past Medical History:  Diagnosis Date  . Anxiety   . Arthritis   . Atrial fibrillation ([place])   . Depression   . Dysrhythmia    atrial fib  . GERD (gastroesophageal reflux disease)    no meds needed- diet controlled  . Heart palpitations   . Hypertension   . Joint pain   . Obesity    Past Surgical History:  Procedure Laterality Date  . CATARACT EXTRACTION Left   . CATARACT EXTRACTION W/PHACO Right 03/05/2016   Procedure: CATARACT EXTRACTION PHACO AND INTRAOCULAR LENS PLACEMENT ; CDE:  11.47;  Surgeon: Tonny Branch, MD;  Location: [facility];  Service: Ophthalmology;  Laterality: Right;  . ESOPHAGOGASTRODUODENOSCOPY  03/29/2002   EHO:ZYYQMG esophagus, stomach, and duodenum through the second  portion.  . hernia repair Bilateral    Inguinal  . TOTAL KNEE ARTHROPLASTY Right 11/13/2015   Procedure: TOTAL KNEE ARTHROPLASTY;  Surgeon: Latanya Maudlin, MD;  Location: [facility];  Service: Orthopedics;  Laterality: Right;    reports that he quit smoking about 42 years ago. His smoking use included cigarettes. He started smoking about 62 years ago. He has a 20.00 pack-year smoking history. He has never used smokeless tobacco. He reports that he drinks alcohol. He reports that he does not use drugs. family history includes Heart attack in his brother; Hypertension in his mother and sister; Stroke in his  father, mother, and sister. Allergies  Allergen Reactions  . Ferrous Sulfate     REACTION: swelling, hives. Doesn't recall.  . Sulfa Antibiotics Hives and Swelling     Review of Systems  Constitutional: Negative for fatigue.  Eyes: Negative for visual disturbance.  Respiratory: Negative for cough, chest tightness and shortness of breath.   Cardiovascular: Negative for chest pain, palpitations and leg swelling.  Neurological: Negative for dizziness, syncope, weakness, light-headedness and headaches.  Psychiatric/Behavioral: The patient is nervous/anxious.        Objective:   Physical Exam  Constitutional: He is oriented to person, place, and time. He appears well-developed and well-nourished.  HENT:  Right Ear: External ear normal.  Left Ear: External ear normal.  Mouth/Throat: Oropharynx is clear and moist.  Eyes: Pupils are equal, round, and reactive to light.  Neck: Neck supple. No thyromegaly present.  Cardiovascular: Normal rate and regular rhythm.  Pulmonary/Chest: Effort normal and breath sounds normal. No respiratory distress. He has no wheezes. He has no rales.  Musculoskeletal: He exhibits no edema.  Neurological: He is alert and oriented to person, place, and time.       Assessment:     #1 hypertension. Currently well controlled today. Recent sporadic elevation as above possibly exacerbated by nonsteroidal use and anxiety  #2 chronic anxiety    Plan:     -he is advised not to overuse alprazolam above what is prescribed -Strict avoidance  of nonsteroidals -monitor blood pressure and be in touch if consistently greater than 140/90.  [name] MD [facility] at [facility]

## 2018-03-21 NOTE — Patient Instructions (Signed)
Monitor blood pressure and be in touch if consistently > 140/90.   

## 2018-03-21 NOTE — Telephone Encounter (Signed)
Pt. Reports his BP has been elevated "for about a week." 191/90 this morning. No missed doses of medication. No symptoms. Reports he has had more back pain and has been taking Tylenol and motrin. States he "is very nervous about this." Appointment made for today per [place].  Reason for Disposition . Systolic BP  >= 038 OR Diastolic >= 882  Answer Assessment - Initial Assessment Questions 1. BLOOD PRESSURE: "What is the blood pressure?" "Did you take at least two measurements 5 minutes apart?"     191/90 2. ONSET: "When did you take your blood pressure?"     This morning 3. HOW: "How did you obtain the blood pressure?" (e.g., visiting nurse, automatic home BP monitor)     Home BP monitor 4. HISTORY: "Do you have a history of high blood pressure?"     Yes 5. MEDICATIONS: "Are you taking any medications for blood pressure?" "Have you missed any doses recently?"     Yes 6. OTHER SYMPTOMS: "Do you have any symptoms?" (e.g., headache, chest pain, blurred vision, difficulty breathing, weakness)     Nervous 7. PREGNANCY: "Is there any chance you are pregnant?" "When was your last menstrual period?"     n/a  Protocols used: HIGH BLOOD PRESSURE-A-AH

## 2018-03-28 DIAGNOSIS — S338XXA Sprain of other parts of lumbar spine and pelvis, initial encounter: Secondary | ICD-10-CM | POA: Diagnosis not present

## 2018-03-28 DIAGNOSIS — M9903 Segmental and somatic dysfunction of lumbar region: Secondary | ICD-10-CM | POA: Diagnosis not present

## 2018-03-30 ENCOUNTER — Ambulatory Visit (INDEPENDENT_AMBULATORY_CARE_PROVIDER_SITE_OTHER): Payer: Medicare Other | Admitting: Family Medicine

## 2018-03-30 ENCOUNTER — Other Ambulatory Visit: Payer: Self-pay

## 2018-03-30 ENCOUNTER — Encounter: Payer: Self-pay | Admitting: Family Medicine

## 2018-03-30 VITALS — BP 128/82 | HR 56 | Temp 97.6°F | Resp 15 | Ht 72.0 in | Wt 203.8 lb

## 2018-03-30 DIAGNOSIS — F411 Generalized anxiety disorder: Secondary | ICD-10-CM | POA: Diagnosis not present

## 2018-03-30 DIAGNOSIS — I10 Essential (primary) hypertension: Secondary | ICD-10-CM

## 2018-03-30 DIAGNOSIS — F5101 Primary insomnia: Secondary | ICD-10-CM | POA: Diagnosis not present

## 2018-03-30 MED ORDER — ARIPIPRAZOLE 2 MG PO TABS: 2.0000 mg | ORAL_TABLET | Freq: Every day | ORAL | 3 refills | Status: DC

## 2018-03-30 MED ORDER — BENAZEPRIL-HYDROCHLOROTHIAZIDE 20-12.5 MG PO TABS: 1.0000 | ORAL_TABLET | Freq: Every day | ORAL | 3 refills | Status: DC

## 2018-03-30 NOTE — Patient Instructions (Signed)
Start the Abilify 2 mg one at night  Make sure you are taking the Mirtazepine at night.

## 2018-03-30 NOTE — Progress Notes (Signed)
Subjective:     Patient ID: Randall Lara, male   DOB: Jun 06, 1930, 84 y.o.   MRN: [id number]  HPI Patient returns again today with concerns over several things including recent sporadic elevated blood pressures, increased anxiety, increased depression symptoms, and difficulty sleeping at night. He has history of depression and we added Wellbutrin recently. He feels his depression is some improved. His wife has had decline in health and possible dementia and that has been very stressful for him as he has been forced to take over some home functions such as finances.  Patient has been on Xanax for years and we've had discussions multiple times to try to get him off without success. He recently started overtaking this which we have sternly warned him against. Having difficulty sleeping at night. His blood pressures been up as high as 001 systolic by home readings. No headaches. No chest pain. He currently takes lisinopril HCTZ. We've avoided beta blockers because of his baseline relatively low heart rate. No alcohol use.  His depression currently is treated with Remeron and Wellbutrin. Takes Wellbutrin morning and Remeron at night. Still having difficulty with sleep. He's tried some over-the-counter melatonin without much improvement.  He denies any suicidal ideation  Past Medical History:  Diagnosis Date  . Anxiety   . Arthritis   . Atrial fibrillation ([place])   . Depression   . Dysrhythmia    atrial fib  . GERD (gastroesophageal reflux disease)    no meds needed- diet controlled  . Heart palpitations   . Hypertension   . Joint pain   . Obesity    Past Surgical History:  Procedure Laterality Date  . CATARACT EXTRACTION Left   . CATARACT EXTRACTION W/PHACO Right 03/05/2016   Procedure: CATARACT EXTRACTION PHACO AND INTRAOCULAR LENS PLACEMENT ; CDE:  11.47;  Surgeon: Tonny Branch, MD;  Location: [facility];  Service: Ophthalmology;  Laterality: Right;  . ESOPHAGOGASTRODUODENOSCOPY  03/29/2002   VCB:SWHQPR esophagus, stomach, and duodenum through the second  portion.  . hernia repair Bilateral    Inguinal  . TOTAL KNEE ARTHROPLASTY Right 11/13/2015   Procedure: TOTAL KNEE ARTHROPLASTY;  Surgeon: Latanya Maudlin, MD;  Location: [facility];  Service: Orthopedics;  Laterality: Right;    reports that he quit smoking about 42 years ago. His smoking use included cigarettes. He started smoking about 62 years ago. He has a 20.00 pack-year smoking history. He has never used smokeless tobacco. He reports that he drinks alcohol. He reports that he does not use drugs. family history includes Heart attack in his brother; Hypertension in his mother and sister; Stroke in his father, mother, and sister. Allergies  Allergen Reactions  . Ferrous Sulfate     REACTION: swelling, hives. Doesn't recall.  . Sulfa Antibiotics Hives and Swelling     Review of Systems  Constitutional: Negative for fatigue.  Eyes: Negative for visual disturbance.  Respiratory: Negative for cough, chest tightness and shortness of breath.   Cardiovascular: Negative for chest pain, palpitations and leg swelling.  Neurological: Negative for dizziness, syncope, weakness, light-headedness and headaches.  Psychiatric/Behavioral: Positive for dysphoric mood and sleep disturbance. Negative for agitation and suicidal ideas. The patient is nervous/anxious.        Objective:   Physical Exam  Constitutional: He is oriented to person, place, and time. He appears well-developed and well-nourished.  Cardiovascular: Normal rate and regular rhythm.  Pulmonary/Chest: Effort normal and breath sounds normal.  Neurological: He is alert and oriented to person, place, and time. No  cranial nerve deficit.  Psychiatric:  Slightly anxious.       Assessment:     #1 hypertension. Initial blood pressure here 144/82 and repeat after rest improved and at goal  #2 history of depression. Currently treated with Remeron and Wellbutrin and somewhat  improved but still has some depression symptoms and difficulty sleeping at night  #3 history of chronic anxiety    Plan:     -with elected not to make any changes in blood pressure medication at this time -Continue with Remeron at night and Wellbutrin each morning -Consider addition of low-dose Abilify 2 mg daily at bedtime -Strongly advised no escalation of Xanax dosing -Bring back in 3 weeks to reassess and sooner as needed  [name] MD [facility] at [facility]

## 2018-04-20 ENCOUNTER — Ambulatory Visit (INDEPENDENT_AMBULATORY_CARE_PROVIDER_SITE_OTHER): Payer: Medicare Other | Admitting: Family Medicine

## 2018-04-20 ENCOUNTER — Encounter: Payer: Self-pay | Admitting: Family Medicine

## 2018-04-20 ENCOUNTER — Other Ambulatory Visit: Payer: Self-pay

## 2018-04-20 VITALS — BP 110/74 | HR 67 | Temp 97.8°F | Ht 72.0 in | Wt 204.0 lb

## 2018-04-20 DIAGNOSIS — F322 Major depressive disorder, single episode, severe without psychotic features: Secondary | ICD-10-CM

## 2018-04-20 DIAGNOSIS — Z8679 Personal history of other diseases of the circulatory system: Secondary | ICD-10-CM

## 2018-04-20 DIAGNOSIS — F411 Generalized anxiety disorder: Secondary | ICD-10-CM

## 2018-04-20 DIAGNOSIS — I10 Essential (primary) hypertension: Secondary | ICD-10-CM | POA: Diagnosis not present

## 2018-04-20 MED ORDER — MIRTAZAPINE 30 MG PO TBDP: 30.0000 mg | ORAL_TABLET | Freq: Every day | ORAL | 1 refills | Status: DC

## 2018-04-20 MED ORDER — BUPROPION HCL ER (XL) 300 MG PO TB24: 300.0000 mg | ORAL_TABLET | Freq: Every day | ORAL | 1 refills | Status: DC

## 2018-04-20 NOTE — Patient Instructions (Signed)
Go ahead and increase the Wellbutrin to 300 mg each morning  Continue with the Mirtazepine at night and the Abilify 2 mg at night.    Let's plan on follow up in one month.

## 2018-04-20 NOTE — Progress Notes (Signed)
Subjective:     Patient ID: Randall Lara, male   DOB: 08-Jan-1930, 84 y.o.   MRN: [id number]  HPI Patient seen for follow-up regarding depression.  He has had several adjustments recently that have been very stressful.  His wife was diagnosed with dementia and basically he has had responsibility for finances which he has never done in the past.  He does have a niece who is assisting with things like medications.    He has had depressed mood and frequent crying spells.  He did have some fleeting suicidal thoughts several weeks ago.  He states he is improving.  He is currently on a regimen of mirtazapine 30 mg at night, Wellbutrin XL 150 mg each morning and Abilify 2 mg at night.  Still has some sleep difficulties but overall slightly improved.  Still has some poor appetite.  Denies any active suicidal ideation.  Hypertension treated with benazepril HCTZ.  He states had some early morning blood pressures as high as 180 but this is usually after not sleeping at night.  Blood pressures very well controlled today here 110/74.  Denies recent headache.  No chest pains.  No peripheral edema.  Past Medical History:  Diagnosis Date  . Anxiety   . Arthritis   . Atrial fibrillation ([place])   . Depression   . Dysrhythmia    atrial fib  . GERD (gastroesophageal reflux disease)    no meds needed- diet controlled  . Heart palpitations   . Hypertension   . Joint pain   . Obesity    Past Surgical History:  Procedure Laterality Date  . CATARACT EXTRACTION Left   . CATARACT EXTRACTION W/PHACO Right 03/05/2016   Procedure: CATARACT EXTRACTION PHACO AND INTRAOCULAR LENS PLACEMENT ; CDE:  11.47;  Surgeon: Tonny Branch, MD;  Location: [facility];  Service: Ophthalmology;  Laterality: Right;  . ESOPHAGOGASTRODUODENOSCOPY  03/29/2002   JJO:ACZYSA esophagus, stomach, and duodenum through the second  portion.  . hernia repair Bilateral    Inguinal  . TOTAL KNEE ARTHROPLASTY Right 11/13/2015   Procedure: TOTAL KNEE  ARTHROPLASTY;  Surgeon: Latanya Maudlin, MD;  Location: [facility];  Service: Orthopedics;  Laterality: Right;    reports that he quit smoking about 42 years ago. His smoking use included cigarettes. He started smoking about 62 years ago. He has a 20.00 pack-year smoking history. He has never used smokeless tobacco. He reports that he drinks alcohol. He reports that he does not use drugs. family history includes Heart attack in his brother; Hypertension in his mother and sister; Stroke in his father, mother, and sister. Allergies  Allergen Reactions  . Ferrous Sulfate     REACTION: swelling, hives. Doesn't recall.  . Sulfa Antibiotics Hives and Swelling     Review of Systems  Constitutional: Negative for fatigue.  Eyes: Negative for visual disturbance.  Respiratory: Negative for cough, chest tightness and shortness of breath.   Cardiovascular: Negative for chest pain, palpitations and leg swelling.  Gastrointestinal: Negative for abdominal pain.  Neurological: Negative for dizziness, syncope, weakness, light-headedness and headaches.  Psychiatric/Behavioral: Positive for decreased concentration, dysphoric mood and sleep disturbance. Negative for agitation, confusion and suicidal ideas. The patient is nervous/anxious.        Objective:   Physical Exam  Constitutional: He is oriented to person, place, and time. He appears well-developed and well-nourished.  Cardiovascular: Normal rate and regular rhythm.  Pulmonary/Chest: Effort normal and breath sounds normal.  Musculoskeletal: He exhibits no edema.  Neurological: He is alert  and oriented to person, place, and time.  Psychiatric:  PHQ-9 questionable reliability.  We went through questions with patient but he had some difficulty answering.  We came up with score of 10       Assessment:     #1 Major depression with severe exacerbation overall improving.  PHQ 9 score as above.  Still has some lingering depression symptoms  #2  Hypertension stable and at goal  #3 History of chronic atrial fibrillation treated with Eliquis    Plan:     -Continue Remeron 30 mg nightly and Abilify 2 mg nightly -Increase Wellbutrin XL to 300 mg once daily and reassess in 1 month -We recommend try to stay engaged and busy with activities and hobbies as much as possible -Continue to monitor blood pressure but no changes in medication dose made at this time  [name] MD [facility] at [facility]

## 2018-05-30 ENCOUNTER — Encounter: Payer: Self-pay | Admitting: Family Medicine

## 2018-05-30 ENCOUNTER — Other Ambulatory Visit: Payer: Self-pay

## 2018-05-30 ENCOUNTER — Ambulatory Visit (INDEPENDENT_AMBULATORY_CARE_PROVIDER_SITE_OTHER): Payer: Medicare Other | Admitting: Family Medicine

## 2018-05-30 VITALS — BP 136/88 | HR 59 | Temp 97.5°F | Ht 72.0 in | Wt 202.5 lb

## 2018-05-30 DIAGNOSIS — I10 Essential (primary) hypertension: Secondary | ICD-10-CM | POA: Diagnosis not present

## 2018-05-30 DIAGNOSIS — Z8679 Personal history of other diseases of the circulatory system: Secondary | ICD-10-CM | POA: Diagnosis not present

## 2018-05-30 DIAGNOSIS — F411 Generalized anxiety disorder: Secondary | ICD-10-CM | POA: Diagnosis not present

## 2018-05-30 DIAGNOSIS — F322 Major depressive disorder, single episode, severe without psychotic features: Secondary | ICD-10-CM | POA: Diagnosis not present

## 2018-05-30 NOTE — Patient Instructions (Signed)
Persistent Depressive Disorder, Adult Persistent depressive disorder (PDD) is a mental health condition that causes symptoms of low-level depression for 2 years or longer. It may also be called long-term (chronic) depression or dysthymia. PDD may include episodes of more severe depression that last for about 2 weeks (major depressive disorder or MDD). PDD can affect the way you think, feel, and sleep. This condition may also affect your relationships. You may be more likely to get sick if you have PDD. What are the causes? The exact cause of this condition is not known. PDD is most likely caused by a combination of things, which may include:  Genetic factors. These are traits that are passed along from parent to child.  Individual factors. Your personality, your behavior, and the way you handle your thoughts and feelings may contribute to PDD. This includes personality traits and behaviors learned from others.  Physical factors, such as: ? Differences in the part of your brain that controls emotion. This part of your brain may be different than it is in people who do not have PDD. ? Long-term (chronic) medical or psychiatric illnesses.  Social factors. Traumatic experiences or major life changes may play a role in the development of PDD.  What increases the risk? This condition is more likely to develop in women. The following factors may make you more likely to develop PDD:  A family history of depression.  Abnormally low levels of certain brain chemicals.  Traumatic events in childhood, especially abuse or the loss of a parent.  Being under a lot of stress, or long-term stress, especially from upsetting life experiences or losses.  A history of: ? Chronic physical illness. ? Other mental health disorders. ? Substance abuse.  Poor living conditions.  Experiencing social exclusion or discrimination on a regular basis.  What are the signs or symptoms? Symptoms of this condition  occur for most of the day, and may include:  Fatigue or low energy.  Eating too much or too little.  Sleeping too much or too little.  Restlessness or agitation.  Feelings of hopelessness.  Feeling worthless or guilty.  Anxiety.  Poor concentration or difficulty making decisions.  Low self-esteem.  Negative outlook.  Inability to have fun or experience pleasure.  Social withdrawal.  Unexplained physical complaints.  Irritability.  Aggressive behavior or anger.  How is this diagnosed? This condition may be diagnosed based on:  Your symptoms.  Your medical history, including your mental health history. This may involve tests to evaluate your mental health. You may be asked questions about your lifestyle, including any drug and alcohol use, and how long you have had symptoms of PDD.  A physical exam.  Blood tests to rule out other conditions.  You may be diagnosed with PDD if you have had a depressed mood for 2 years or longer, as well as other symptoms of depression. How is this treated? This condition is usually treated by mental health professionals, such as psychologists, psychiatrists, and clinical social workers. You may need more than one type of treatment. Treatment may include:  Psychotherapy. This is also called talk therapy or counseling. Types of psychotherapy include: ? Cognitive behavioral therapy (CBT). This type of therapy teaches you to recognize unhealthy feelings, thoughts, and behaviors, and replace them with positive thoughts and actions. ? Interpersonal therapy (IPT). This helps you to improve the way you relate to and communicate with others. ? Family therapy. This treatment includes members of your family.  Medicine to treat anxiety and  depression, or to help you control certain emotions and behaviors.  Lifestyle changes, such as: ? Limiting alcohol and drug use. ? Exercising regularly. ? Getting plenty of sleep. ? Making healthy eating  choices. ? Spending more time outdoors.  Follow these instructions at home: Activity  Return to your normal activities as told by your health care provider.  Exercise regularly and spend time outdoors as told by your health care provider. General instructions  Take over-the-counter and prescription medicines only as told by your health care provider.  Do not drink alcohol. If you drink alcohol, limit your alcohol intake to no more than 1 drink a day for nonpregnant women and 2 drinks a day for men. One drink equals 12 oz of beer, 5 oz of wine, or 1 oz of hard liquor. Alcohol can affect any antidepressant medicines you are taking. Talk to your health care provider about your alcohol use.  Eat a healthy diet and get plenty of sleep.  Find activities that you enjoy doing, and make time to do them.  Consider joining a support group. Your health care provider may be able to recommend a support group.  Keep all follow-up visits as told by your health care provider. This is important. Where to find more information: [employer] on Mental Illness  www.nami.org  U.S. National Institute of Mental Health  https://carter.com/  National Suicide Prevention Lifeline  1-800-273-TALK (801)332-7213). This is free, 24-hour help.  Contact a health care provider if:  Your symptoms get worse.  You develop new symptoms.  You have trouble sleeping or doing your daily activities. Get help right away if:  You self-harm.  You have serious thoughts about hurting yourself or others.  You see, hear, taste, smell, or feel things that are not present (hallucinate). This information is not intended to replace advice given to you by your health care provider. Make sure you discuss any questions you have with your health care provider. Document Released: 06/15/2012 Document Revised: 02/27/2016 Document Reviewed: 01/11/2016 Elsevier Interactive Patient Education  [employer].

## 2018-05-30 NOTE — Progress Notes (Signed)
Subjective:     Patient ID: Randall Lara, male   DOB: [birth year], 84 y.o.   MRN: [id number]  HPI Patient seen for medical follow-up.  Chronic problems include history of atrial fibrillation, hypertension, GERD, chronic anxiety, osteoarthritis involving multiple joints, and history of recent major depression single episode with severe level of severity  He has also had some difficulty sleeping and appetite loss and was started on Remeron currently 30 mg at night.  We added Wellbutrin and titrated up to 300 mg last visit.  He does think this has helped some.  He does not feel in total remission but is overall greatly improved.  Also takes low-dose Abilify 2 mg nightly.  Still has difficulty sleeping some at night but overall improved.  He has stress of his wife who has dementia.  They are coping fairly well.  Hypertension treated with lisinopril HCTZ.  Denies any recent dizziness.  No falls.  Remains on Eliquis.  No bleeding complications.  Past Medical History:  Diagnosis Date  . Anxiety   . Arthritis   . Atrial fibrillation ([place])   . Depression   . Dysrhythmia    atrial fib  . GERD (gastroesophageal reflux disease)    no meds needed- diet controlled  . Heart palpitations   . Hypertension   . Joint pain   . Obesity    Past Surgical History:  Procedure Laterality Date  . CATARACT EXTRACTION Left   . CATARACT EXTRACTION W/PHACO Right 03/05/2016   Procedure: CATARACT EXTRACTION PHACO AND INTRAOCULAR LENS PLACEMENT ; CDE:  11.47;  Surgeon: Tonny Branch, MD;  Location: [facility];  Service: Ophthalmology;  Laterality: Right;  . ESOPHAGOGASTRODUODENOSCOPY  03/29/2002   IRJ:JOACZY esophagus, stomach, and duodenum through the second  portion.  . hernia repair Bilateral    Inguinal  . TOTAL KNEE ARTHROPLASTY Right 11/13/2015   Procedure: TOTAL KNEE ARTHROPLASTY;  Surgeon: Latanya Maudlin, MD;  Location: [facility];  Service: Orthopedics;  Laterality: Right;    reports that he quit smoking about 42  years ago. His smoking use included cigarettes. He started smoking about 62 years ago. He has a 20.00 pack-year smoking history. He has never used smokeless tobacco. He reports that he drinks alcohol. He reports that he does not use drugs. family history includes Heart attack in his brother; Hypertension in his mother and sister; Stroke in his father, mother, and sister. Allergies  Allergen Reactions  . Ferrous Sulfate     REACTION: swelling, hives. Doesn't recall.  . Sulfa Antibiotics Hives and Swelling     Review of Systems  Constitutional: Negative for fatigue.  Eyes: Negative for visual disturbance.  Respiratory: Negative for cough, chest tightness and shortness of breath.   Cardiovascular: Negative for chest pain, palpitations and leg swelling.  Neurological: Negative for dizziness, syncope, weakness, light-headedness and headaches.  Psychiatric/Behavioral: Positive for sleep disturbance. Negative for agitation and suicidal ideas. The patient is nervous/anxious.        Objective:   Physical Exam  Constitutional: He is oriented to person, place, and time. He appears well-developed and well-nourished.  Neck: Neck supple. No thyromegaly present.  Cardiovascular: Normal rate and regular rhythm.  Pulmonary/Chest: Effort normal and breath sounds normal.  Musculoskeletal: He exhibits no edema.  Neurological: He is alert and oriented to person, place, and time.  Psychiatric: His behavior is normal.       Assessment:     #1 hypertension-stable  #2 history of atrial fibrillation.  Appears to be in sinus rhythm  at this time  #3 history of recent major depression overall greatly improved.  PHQ-9 score last visit was 10  #4 history of chronic anxiety    Plan:     -Continue current medications -Continue to monitor blood pressure at home.  Be in touch if consistently greater than 223 systolic. -We discussed diet.  He has had some modest weight loss but seems to be stabilizing  somewhat. -Scheduled follow-up in January and follow-up sooner as needed  [name] MD [facility]

## 2018-06-01 ENCOUNTER — Other Ambulatory Visit: Payer: Self-pay | Admitting: Family Medicine

## 2018-07-15 ENCOUNTER — Other Ambulatory Visit: Payer: Self-pay | Admitting: Family Medicine

## 2018-07-15 NOTE — Telephone Encounter (Signed)
Last OV 05/30/18, Next OV 07/19/18  Last filled 02/08/18, # 90 with 5 refills

## 2018-07-15 NOTE — Telephone Encounter (Signed)
Refill for 6 months. 

## 2018-07-18 ENCOUNTER — Ambulatory Visit: Payer: Medicare Other | Admitting: Family Medicine

## 2018-07-19 ENCOUNTER — Encounter: Payer: Self-pay | Admitting: Family Medicine

## 2018-07-19 ENCOUNTER — Other Ambulatory Visit: Payer: Self-pay

## 2018-07-19 ENCOUNTER — Telehealth: Payer: Self-pay | Admitting: Family Medicine

## 2018-07-19 ENCOUNTER — Ambulatory Visit (INDEPENDENT_AMBULATORY_CARE_PROVIDER_SITE_OTHER): Payer: Medicare Other | Admitting: Family Medicine

## 2018-07-19 VITALS — BP 126/68 | HR 65 | Temp 97.6°F | Ht 72.0 in | Wt 201.4 lb

## 2018-07-19 DIAGNOSIS — Z8679 Personal history of other diseases of the circulatory system: Secondary | ICD-10-CM

## 2018-07-19 DIAGNOSIS — I10 Essential (primary) hypertension: Secondary | ICD-10-CM | POA: Diagnosis not present

## 2018-07-19 DIAGNOSIS — F321 Major depressive disorder, single episode, moderate: Secondary | ICD-10-CM | POA: Diagnosis not present

## 2018-07-19 LAB — CBC WITH DIFFERENTIAL/PLATELET
BASOS ABS: 0 10*3/uL (ref 0.0–0.1)
Basophils Relative: 0.4 % (ref 0.0–3.0)
Eosinophils Absolute: 0.1 10*3/uL (ref 0.0–0.7)
Eosinophils Relative: 1.4 % (ref 0.0–5.0)
HEMATOCRIT: 43.6 % (ref 39.0–52.0)
HEMOGLOBIN: 14.7 g/dL (ref 13.0–17.0)
LYMPHS PCT: 30.4 % (ref 12.0–46.0)
Lymphs Abs: 1.8 10*3/uL (ref 0.7–4.0)
MCHC: 33.7 g/dL (ref 30.0–36.0)
MCV: 96.1 fl (ref 78.0–100.0)
Monocytes Absolute: 0.7 10*3/uL (ref 0.1–1.0)
Monocytes Relative: 11.9 % (ref 3.0–12.0)
NEUTROS ABS: 3.3 10*3/uL (ref 1.4–7.7)
Neutrophils Relative %: 55.9 % (ref 43.0–77.0)
PLATELETS: 134 10*3/uL — AB (ref 150.0–400.0)
RBC: 4.53 Mil/uL (ref 4.22–5.81)
RDW: 13.5 % (ref 11.5–15.5)
WBC: 5.9 10*3/uL (ref 4.0–10.5)

## 2018-07-19 LAB — BASIC METABOLIC PANEL
BUN: 24 mg/dL — ABNORMAL HIGH (ref 6–23)
CO2: 33 meq/L — AB (ref 19–32)
Calcium: 9.9 mg/dL (ref 8.4–10.5)
Chloride: 99 mEq/L (ref 96–112)
Creatinine, Ser: 1.17 mg/dL (ref 0.40–1.50)
GFR: 62.69 mL/min (ref >60.00)
GLUCOSE: 91 mg/dL (ref 70–99)
POTASSIUM: 4.1 meq/L (ref 3.5–5.1)
SODIUM: 140 meq/L (ref 135–145)

## 2018-07-19 LAB — TSH: TSH: 2.42 u[IU]/mL (ref 0.35–4.50)

## 2018-07-19 MED ORDER — MIRTAZAPINE 30 MG PO TBDP: 30.0000 mg | ORAL_TABLET | Freq: Every day | ORAL | 3 refills | Status: DC

## 2018-07-19 MED ORDER — APIXABAN 5 MG PO TABS: 5.0000 mg | ORAL_TABLET | Freq: Two times a day (BID) | ORAL | 6 refills | Status: DC

## 2018-07-19 NOTE — Progress Notes (Signed)
Subjective:     Patient ID: Randall Lara, male   DOB: Feb 19, 1930, 85 y.o.   MRN: [id number]  HPI Patient here to discuss several items.  He had some confusion regarding medications.  He had an old bottle for mirtazapine 15 mg but also paper print out for 30 mg.  He should be on 30 mg currently.  He states his depression is doing much better on combination therapy with mirtazapine and Wellbutrin.  Also taking very low-dose Abilify 2 mg at night.  He has chronic anxiety and has been on alprazolam for many years.  We tried diligently to taper him off but he has been extremely reluctant.  No recent falls.  No balance issues.  He continues to deal with stress of his wife who was diagnosed recently with dementia.  He has hypertension treated with benazepril HCTZ.  No recent electrolytes.  He has history of A. fib and is on Eliquis 5 mg twice daily.  Denies any recent dizziness, headaches, chest pain, dyspnea.  No recent syncope.  Past Medical History:  Diagnosis Date  . Anxiety   . Arthritis   . Atrial fibrillation ([place])   . Depression   . Dysrhythmia    atrial fib  . GERD (gastroesophageal reflux disease)    no meds needed- diet controlled  . Heart palpitations   . Hypertension   . Joint pain   . Obesity    Past Surgical History:  Procedure Laterality Date  . CATARACT EXTRACTION Left   . CATARACT EXTRACTION W/PHACO Right 03/05/2016   Procedure: CATARACT EXTRACTION PHACO AND INTRAOCULAR LENS PLACEMENT ; CDE:  11.47;  Surgeon: Tonny Branch, MD;  Location: [facility];  Service: Ophthalmology;  Laterality: Right;  . ESOPHAGOGASTRODUODENOSCOPY  03/29/2002   CBS:WHQPRF esophagus, stomach, and duodenum through the second  portion.  . hernia repair Bilateral    Inguinal  . TOTAL KNEE ARTHROPLASTY Right 11/13/2015   Procedure: TOTAL KNEE ARTHROPLASTY;  Surgeon: Latanya Maudlin, MD;  Location: [facility];  Service: Orthopedics;  Laterality: Right;    reports that he quit smoking about 42 years ago. His  smoking use included cigarettes. He started smoking about 62 years ago. He has a 20.00 pack-year smoking history. He has never used smokeless tobacco. He reports current alcohol use. He reports that he does not use drugs. family history includes Heart attack in his brother; Hypertension in his mother and sister; Stroke in his father, mother, and sister. Allergies  Allergen Reactions  . Ferrous Sulfate     REACTION: swelling, hives. Doesn't recall.  . Sulfa Antibiotics Hives and Swelling     Review of Systems  Constitutional: Negative for fatigue and unexpected weight change.  Eyes: Negative for visual disturbance.  Respiratory: Negative for cough, chest tightness and shortness of breath.   Cardiovascular: Negative for chest pain, palpitations and leg swelling.  Neurological: Negative for dizziness, syncope, weakness, light-headedness and headaches.  Psychiatric/Behavioral: Negative for self-injury and suicidal ideas. The patient is nervous/anxious.        Objective:   Physical Exam Constitutional:      Appearance: Normal appearance.  Cardiovascular:     Rate and Rhythm: Normal rate and regular rhythm.  Pulmonary:     Effort: Pulmonary effort is normal.     Breath sounds: Normal breath sounds.  Musculoskeletal:     Right lower leg: No edema.     Left lower leg: No edema.  Neurological:     Mental Status: He is alert.  Psychiatric:  Mood and Affect: Mood normal.        Thought Content: Thought content normal.        Assessment:     #1 recent major depressive episode improved with combination therapy of mirtazapine and bupropion.  Refilled Remeron 30 mg nightly and recommend minimum of another 6 months of therapy  #2 hypertension stable and at goal  #3 history of chronic atrial fibrillation.  appears to be in sinus rhythm again today    Plan:     -Check labs with CBC, TSH, basic metabolic panel -Refill mirtazapine 30 mg SolTab 1 nightly -Refill Eliquis for 6  months -Routine follow-up in 6 months and sooner as needed  [name] MD [facility]

## 2018-07-19 NOTE — Telephone Encounter (Signed)
Copied from [place] [phone] [phone]. Topic: Quick Communication - Rx Refill/Question >> Jul 19, 2018  3:51 PM Margot Ables wrote: Medication: mirtazapine (REMERON SOL-TAB) 30 MG disintegrating tablet - pt called stating that this is the wrong medication. He said that he and Dr. Elease Hashimoto discussed increasing dose to the 45mg . Pt states that they discussed eliminating the 15mg  so he doesn't take 2 pills and he threw the 15mg  in the trash. Pt was very confused and went back and forth on the dosage. He is asking for Jinny Blossom to call him back stating he knows her and she lives 2 miles away from him.  [facility] - [place], [place], [place]

## 2018-07-19 NOTE — Telephone Encounter (Signed)
Called patient and have him the message from Dr. Elease Hashimoto. Patient verbalized an understanding.

## 2018-07-19 NOTE — Telephone Encounter (Signed)
Please see message. Please advise before I call him back. I see the note for the 30 mg, not for 45 mg

## 2018-07-19 NOTE — Telephone Encounter (Signed)
We did NOT discuss increasing to 45 mg .   He had been previously on 15 mg and we refilled the 30 mg dose  30 mg is the correct dose.

## 2018-07-29 ENCOUNTER — Other Ambulatory Visit: Payer: Self-pay | Admitting: Family Medicine

## 2018-09-12 ENCOUNTER — Other Ambulatory Visit: Payer: Self-pay | Admitting: Family Medicine

## 2018-09-12 NOTE — Telephone Encounter (Signed)
Refill with 2 additional refills. 

## 2018-09-12 NOTE — Telephone Encounter (Signed)
Last OV 07/19/18, Next OV 01/17/19  Last filled 07/15/18, # 70 with 1 refill

## 2018-09-13 NOTE — Telephone Encounter (Signed)
Rx called in 

## 2018-09-29 ENCOUNTER — Telehealth: Payer: Self-pay | Admitting: Cardiovascular Disease

## 2018-09-29 NOTE — Telephone Encounter (Signed)
I called but was unable to reach the patient.  I left him a voicemail asking about his cardiac symptoms and recommending that his appointment be rescheduled until the [place] pandemic has subsided.  I asked him to give our office a call back.

## 2018-09-30 NOTE — Telephone Encounter (Signed)
Office visit has been rescheduled for 12/09/2018.

## 2018-10-04 ENCOUNTER — Ambulatory Visit: Payer: Medicare Other | Admitting: Cardiovascular Disease

## 2018-12-09 ENCOUNTER — Ambulatory Visit: Payer: Medicare Other | Admitting: Cardiovascular Disease

## 2018-12-09 ENCOUNTER — Other Ambulatory Visit: Payer: Self-pay | Admitting: Family Medicine

## 2019-01-04 ENCOUNTER — Telehealth: Payer: Self-pay | Admitting: Cardiovascular Disease

## 2019-01-04 NOTE — Telephone Encounter (Signed)
Virtual Visit Pre-Appointment Phone Call  "(Name), I am calling you today to discuss your upcoming appointment. We are currently trying to limit exposure to the virus that causes COVID-19 by seeing patients at home rather than in the office."  1. "What is the BEST phone number to call the day of the visit?" - include this in appointment notes  2. Do you have or have access to (through a family member/friend) a smartphone with video capability that we can use for your visit?" a. If yes - list this number in appt notes as cell (if different from BEST phone #) and list the appointment type as a VIDEO visit in appointment notes b. If no - list the appointment type as a PHONE visit in appointment notes  3. Confirm consent - "In the setting of the current Covid19 crisis, you are scheduled for a (phone or video) visit with your provider on (date) at (time).  Just as we do with many in-office visits, in order for you to participate in this visit, we must obtain consent.  If you'd like, I can send this to your mychart (if signed up) or email for you to review.  Otherwise, I can obtain your verbal consent now.  All virtual visits are billed to your insurance company just like a normal visit would be.  By agreeing to a virtual visit, we'd like you to understand that the technology does not allow for your provider to perform an examination, and thus may limit your provider's ability to fully assess your condition. If your provider identifies any concerns that need to be evaluated in person, we will make arrangements to do so.  Finally, though the technology is pretty good, we cannot assure that it will always work on either your or our end, and in the setting of a video visit, we may have to convert it to a phone-only visit.  In either situation, we cannot ensure that we have a secure connection.  Are you willing to proceed?" STAFF: Did the patient verbally acknowledge consent to telehealth visit? Document  YES/NO here: yes  4. Advise patient to be prepared - "Two hours prior to your appointment, go ahead and check your blood pressure, pulse, oxygen saturation, and your weight (if you have the equipment to check those) and write them all down. When your visit starts, your provider will ask you for this information. If you have an Apple Watch or Kardia device, please plan to have heart rate information ready on the day of your appointment. Please have a pen and paper handy nearby the day of the visit as well."  5. Give patient instructions for MyChart download to smartphone OR Doximity/Doxy.me as below if video visit (depending on what platform provider is using)  6. Inform patient they will receive a phone call 15 minutes prior to their appointment time (may be from unknown caller ID) so they should be prepared to answer    TELEPHONE CALL NOTE  Randall Lara has been deemed a candidate for a follow-up tele-health visit to limit community exposure during the Covid-19 pandemic. I spoke with the patient via phone to ensure availability of phone/video source, confirm preferred email & phone number, and discuss instructions and expectations.  I reminded Randall Lara to be prepared with any vital sign and/or heart rhythm information that could potentially be obtained via home monitoring, at the time of his visit. I reminded Randall Lara to expect a phone call prior to  his visit.  Weston Anna 01/04/2019 1:45 PM   INSTRUCTIONS FOR DOWNLOADING THE MYCHART APP TO SMARTPHONE  - The patient must first make sure to have activated MyChart and know their login information - If Apple, go to [employer] and type in MyChart in the search bar and download the app. If Android, ask patient to go to [employer] and type in [place] in the search bar and download the app. The app is free but as with any other app downloads, their phone may require them to verify saved payment information or Apple/Android  password.  - The patient will need to then log into the app with their MyChart username and password, and select [facility] as their healthcare provider to link the account. When it is time for your visit, go to the MyChart app, find appointments, and click Begin Video Visit. Be sure to Select Allow for your device to access the Microphone and Camera for your visit. You will then be connected, and your provider will be with you shortly.  **If they have any issues connecting, or need assistance please contact MyChart service desk [phone])**  **If using a computer, in order to ensure the best quality for their visit they will need to use either of the following Internet Browsers: [employer], or Google Chrome**  IF USING DOXIMITY or DOXY.ME - The patient will receive a link just prior to their visit by text.     FULL LENGTH CONSENT FOR TELE-HEALTH VISIT   I hereby voluntarily request, consent and authorize [facility] and its employed or contracted physicians, physician assistants, nurse practitioners or other licensed health care professionals (the Practitioner), to provide me with telemedicine health care services (the Services") as deemed necessary by the treating Practitioner. I acknowledge and consent to receive the Services by the Practitioner via telemedicine. I understand that the telemedicine visit will involve communicating with the Practitioner through live audiovisual communication technology and the disclosure of certain medical information by electronic transmission. I acknowledge that I have been given the opportunity to request an in-person assessment or other available alternative prior to the telemedicine visit and am voluntarily participating in the telemedicine visit.  I understand that I have the right to withhold or withdraw my consent to the use of telemedicine in the course of my care at any time, without affecting my right to future care or treatment,  and that the Practitioner or I may terminate the telemedicine visit at any time. I understand that I have the right to inspect all information obtained and/or recorded in the course of the telemedicine visit and may receive copies of available information for a reasonable fee.  I understand that some of the potential risks of receiving the Services via telemedicine include:   Delay or interruption in medical evaluation due to technological equipment failure or disruption;  Information transmitted may not be sufficient (e.g. poor resolution of images) to allow for appropriate medical decision making by the Practitioner; and/or   In rare instances, security protocols could fail, causing a breach of personal health information.  Furthermore, I acknowledge that it is my responsibility to provide information about my medical history, conditions and care that is complete and accurate to the best of my ability. I acknowledge that Practitioner's advice, recommendations, and/or decision may be based on factors not within their control, such as incomplete or inaccurate data provided by me or distortions of diagnostic images or specimens that may result from electronic transmissions. I  understand that the practice of medicine is not an exact science and that Practitioner makes no warranties or guarantees regarding treatment outcomes. I acknowledge that I will receive a copy of this consent concurrently upon execution via email to the email address I last provided but may also request a printed copy by calling the office of [facility].    I understand that my insurance will be billed for this visit.   I have read or had this consent read to me.  I understand the contents of this consent, which adequately explains the benefits and risks of the Services being provided via telemedicine.   I have been provided ample opportunity to ask questions regarding this consent and the Services and have had my questions  answered to my satisfaction.  I give my informed consent for the services to be provided through the use of telemedicine in my medical care  By participating in this telemedicine visit I agree to the above.

## 2019-01-09 ENCOUNTER — Other Ambulatory Visit: Payer: Self-pay | Admitting: Family Medicine

## 2019-01-09 NOTE — Telephone Encounter (Signed)
Last OV 07/19/18, Next OV 01/17/19  Last filled 09/13/18, # 90 with 2 refills

## 2019-01-17 ENCOUNTER — Ambulatory Visit (INDEPENDENT_AMBULATORY_CARE_PROVIDER_SITE_OTHER): Payer: Medicare Other | Admitting: Family Medicine

## 2019-01-17 ENCOUNTER — Other Ambulatory Visit: Payer: Self-pay

## 2019-01-17 DIAGNOSIS — Z8679 Personal history of other diseases of the circulatory system: Secondary | ICD-10-CM | POA: Diagnosis not present

## 2019-01-17 DIAGNOSIS — F411 Generalized anxiety disorder: Secondary | ICD-10-CM

## 2019-01-17 DIAGNOSIS — F322 Major depressive disorder, single episode, severe without psychotic features: Secondary | ICD-10-CM | POA: Diagnosis not present

## 2019-01-17 DIAGNOSIS — I10 Essential (primary) hypertension: Secondary | ICD-10-CM | POA: Diagnosis not present

## 2019-01-17 NOTE — Progress Notes (Signed)
Patient ID: CUONG MOORMAN, male   DOB: Nov 12, 1929, 85 y.o.   MRN: [id number]  This visit type was conducted due to national recommendations for restrictions regarding the COVID-19 pandemic in an effort to limit this patient's exposure and mitigate transmission in our community.   Virtual Visit via Telephone Note  I connected with Barth Kirks on 01/17/19 at  1:15 PM EDT by telephone and verified that I am speaking with the correct person using two identifiers.   I discussed the limitations, risks, security and privacy concerns of performing an evaluation and management service by telephone and the availability of in person appointments. I also discussed with the patient that there may be a patient responsible charge related to this service. The patient expressed understanding and agreed to proceed.  Location patient: home Location provider: work or home office Participants present for the call: patient, provider Patient did not have a visit in the prior 7 days to address this/these issue(s).   History of Present Illness: Routine medical follow-up.  His chronic problems include hypertension, atrial fibrillation, history of severe depression, GERD.  He battle some severe depression last year and feels that his current combination therapy of Wellbutrin, low-dose Abilify at night, and Remeron are working well.  He is sleeping well.  Still has occasional bouts with depression but no suicidal ideation.  He states that physically he feels very well.  No chest pains.  No dizziness.  No dyspnea.  Blood pressures been stable.  He remains on Eliquis for anticoagulation.  No bleeding problems.  He had labs back in January which were all stable  He states he is compliant with all medications.  Denies any recent falls.  He continues to stay active with things like yard work.  He is no longer gardens.  His wife has had some cognitive issues and is requiring more care   Observations/Objective: Patient sounds  cheerful and well on the phone. I do not appreciate any SOB. Speech and thought processing are grossly intact. Patient reported vitals:  Assessment and Plan:  #1 history of atrial fibrillation-remains on Eliquis and symptomatically stable  #2 hypertension-history of good control  #3 history of chronic anxiety.  We have tried multiple times in the past to taper him back on alprazolam without success  #4 history of depression currently stable on combination therapy as above  Follow Up Instructions:  -We will plan routine follow-up in 6 months unless indicated sooner.  We will plan to get follow-up lab work then.   [id number] [id number] [id number] 11-20 [id number] 21-30 I did not refer this patient for an OV in the next 24 hours for this/these issue(s).  I discussed the assessment and treatment plan with the patient. The patient was provided an opportunity to ask questions and all were answered. The patient agreed with the plan and demonstrated an understanding of the instructions.   The patient was advised to call back or seek an in-person evaluation if the symptoms worsen or if the condition fails to improve as anticipated.  I provided 25 minutes of non-face-to-face time during this encounter.   Carolann Littler, MD

## 2019-01-18 ENCOUNTER — Telehealth (INDEPENDENT_AMBULATORY_CARE_PROVIDER_SITE_OTHER): Payer: Medicare Other | Admitting: Cardiovascular Disease

## 2019-01-18 ENCOUNTER — Encounter: Payer: Self-pay | Admitting: Cardiovascular Disease

## 2019-01-18 VITALS — BP 140/68 | Ht 72.0 in | Wt 193.0 lb

## 2019-01-18 DIAGNOSIS — I4821 Permanent atrial fibrillation: Secondary | ICD-10-CM | POA: Diagnosis not present

## 2019-01-18 DIAGNOSIS — I10 Essential (primary) hypertension: Secondary | ICD-10-CM

## 2019-01-18 NOTE — Progress Notes (Signed)
Virtual Visit via Telephone Note   This visit type was conducted due to national recommendations for restrictions regarding the COVID-19 Pandemic (e.g. social distancing) in an effort to limit this patient's exposure and mitigate transmission in our community.  Due to his co-morbid illnesses, this patient is at least at moderate risk for complications without adequate follow up.  This format is felt to be most appropriate for this patient at this time.  The patient did not have access to video technology/had technical difficulties with video requiring transitioning to audio format only (telephone).  All issues noted in this document were discussed and addressed.  No physical exam could be performed with this format.  Please refer to the patient's chart for his  consent to telehealth for Randall Lara.   Date:  01/18/2019   ID:  Randall Lara, DOB [birth year], MRN [id number]  Patient Location: Home Provider Location: Office  PCP:  No primary care provider on file.  Cardiologist:  Kate Sable, MD  Electrophysiologist:  None   Evaluation Performed:  Follow-Up Visit  Chief Complaint: Atrial fibrillation  History of Present Illness:    Randall Lara is a 85 y.o. male with permanent atrial fibrillation.  He also has depression and is treated by his PCP for this.  He denies chest pain and shortness of breath. He has occasional palpitations. He gets confused at times and told me he has mild dementia. He doesn't drive much anymore and his niece drives him to places.  He said he is fairly agile and gets around fairly well.  The patient does not have symptoms concerning for COVID-19 infection (fever, chills, cough, or new shortness of breath).   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 They used to own a home at [place] and vacation there  every summer.   Past Medical History:  Diagnosis Date  . Anxiety   . Arthritis   . Atrial fibrillation ([place])   . Depression   . Dysrhythmia    atrial fib  . GERD (gastroesophageal reflux disease)    no meds needed- diet controlled  . Heart palpitations   . Hypertension   . Joint pain   . Obesity    Past Surgical History:  Procedure Laterality Date  . CATARACT EXTRACTION Left   . CATARACT EXTRACTION W/PHACO Right 03/05/2016   Procedure: CATARACT EXTRACTION PHACO AND INTRAOCULAR LENS PLACEMENT ; CDE:  11.47;  Surgeon: Tonny Branch, MD;  Location: [facility];  Service: Ophthalmology;  Laterality: Right;  . ESOPHAGOGASTRODUODENOSCOPY  03/29/2002   SWN:[id number] esophagus, stomach, and duodenum through the second  portion.  . hernia repair Bilateral    Inguinal  . TOTAL KNEE ARTHROPLASTY Right 11/13/2015   Procedure: TOTAL KNEE ARTHROPLASTY;  Surgeon: Latanya Maudlin, MD;  Location: [facility];  Service: Orthopedics;  Laterality: Right;     Current Meds  Medication Sig  . ALPRAZolam (XANAX) 1 MG tablet TAKE ONE TABLET BY MOUTH THREE TIMES DAILY  . ARIPiprazole (ABILIFY) 2 MG tablet TAKE ONE TABLET BY MOUTH DAILY.  . benazepril-hydrochlorthiazide (LOTENSIN HCT) 20-12.5 MG tablet TAKE ONE TABLET BY MOUTH DAILY.  Marland Kitchen buPROPion (WELLBUTRIN XL) 300 MG 24 hr tablet TAKE ONE TABLET BY MOUTH DAILY.  Marland Kitchen ELIQUIS 5 MG TABS tablet TAKE ONE TABLET BY MOUTH TWICE DAILY.  . mirtazapine (REMERON SOL-TAB) 30 MG  disintegrating tablet Take 1 tablet (30 mg total) by mouth at bedtime.  . nitroGLYCERIN (NITROSTAT) 0.4 MG SL tablet Place 1 tablet (0.4 mg total) under the tongue every 5 (five) minutes as needed for chest pain.  Marland Kitchen nystatin-triamcinolone ointment (MYCOLOG) Apply 1 application topically 2 (two) times daily.     Allergies:   Ferrous sulfate and Sulfa antibiotics   Social History   Tobacco Use  . Smoking status: Former Smoker    Packs/day: 1.00    Years: 20.00    Pack years: 20.00    Types: Cigarettes     Start date: 08/07/1955    Quit date: 08/07/1975    Years since quitting: 43.4  . Smokeless tobacco: Never Used  Substance Use Topics  . Alcohol use: Yes    Alcohol/week: 0.0 standard drinks    Comment: occassional  . Drug use: No     Family Hx: The patient's family history includes Heart attack in his brother; Hypertension in his mother and sister; Stroke in his father, mother, and sister. There is no history of Colon cancer.  ROS:   Please see the history of present illness.     All other systems reviewed and are negative.   Prior CV studies:   The following studies were reviewed today:  Echocardiogram on 07/01/10 demonstrated normal left ventricular systolic function, EF 47-65%, with mild aortic regurgitation.  Low risk nuclear stress test on 07/07/13.    Labs/Other Tests and Data Reviewed:    EKG:  No ECG reviewed.  Recent Labs: 07/19/2018: BUN 24; Creatinine, Ser 1.17; Hemoglobin 14.7; Platelets 134.0; Potassium 4.1; Sodium 140; TSH 2.42   Recent Lipid Panel No results found for: CHOL, TRIG, HDL, CHOLHDL, LDLCALC, LDLDIRECT  Wt Readings from Last 3 Encounters:  01/18/19 193 lb (87.5 kg)  07/19/18 201 lb 6.4 oz (91.4 kg)  05/30/18 202 lb 8 oz (91.9 kg)     Objective:    Vital Signs:  BP 140/68   Ht 6' (1.829 m)   Wt 193 lb (87.5 kg)   BMI 26.18 kg/m    VITAL SIGNS:  reviewed  ASSESSMENT & PLAN:    1. Permanent atrial fibrillation: Symptomatically stable.  Systemically anticoagulated with Eliquis.  Heart rate is normal without AV nodal blocking agents, indicative of conduction system disease.  No changes to therapy.  2.  Hypertension: Blood pressure is mildly elevated.  No changes to therapy.   COVID-19 Education: The signs and symptoms of COVID-19 were discussed with the patient and how to seek care for testing (follow up with PCP or arrange E-visit).  The importance of social distancing was discussed today.  Time:   Today, I have spent 10  minutes with the patient with telehealth technology discussing the above problems.     Medication Adjustments/Labs and Tests Ordered: Current medicines are reviewed at length with the patient today.  Concerns regarding medicines are outlined above.   Tests Ordered: No orders of the defined types were placed in this encounter.   Medication Changes: No orders of the defined types were placed in this encounter.   Follow Up:  Virtual Visit or In Person in 1 year(s)  Signed, Kate Sable, MD  01/18/2019 9:20 AM    [place]

## 2019-01-18 NOTE — Patient Instructions (Signed)

## 2019-01-31 ENCOUNTER — Telehealth: Payer: Self-pay | Admitting: *Deleted

## 2019-01-31 ENCOUNTER — Telehealth: Payer: Self-pay | Admitting: Family Medicine

## 2019-01-31 NOTE — Telephone Encounter (Signed)
Yes.  Refill for 6 months. 

## 2019-01-31 NOTE — Telephone Encounter (Signed)
Called patient and LMOVM to return call  [place] for [facility] to Discuss results / PCP / recommendations / Schedule patient  Left a detailed voice message for patient to call back to schedule appointment.  CRM Created.

## 2019-01-31 NOTE — Telephone Encounter (Signed)
The patient is needing a 6 months supply of all of his medications

## 2019-01-31 NOTE — Telephone Encounter (Signed)
Relation to pt: self  Call back number: [phone]  Pharmacy: [place], [place] [phone] (Phone) [phone] (Fax)     Reason for call:  Patient requesting refill on ALL medications, patient couldn't specify each Rx.  Informed patient in the future please contact pharmacy and have pharmacy send request on patient behalf. Informed patient please allow 48 to 72 hour turn around time

## 2019-01-31 NOTE — Telephone Encounter (Signed)
Copied from [place] [phone]. Topic: Appointment Scheduling - Scheduling Inquiry for Clinic >> Jan 30, 2019  3:56 PM Randall Lara wrote: Reason for CRM: pt called in and would like to make his 6 month fu.  Office on other calls.   Best number  [phone]

## 2019-01-31 NOTE — Telephone Encounter (Signed)
Patients refills look good except for Lotesin HCT. Is this OK to fill?

## 2019-02-03 ENCOUNTER — Other Ambulatory Visit: Payer: Self-pay

## 2019-02-03 MED ORDER — BENAZEPRIL-HYDROCHLOROTHIAZIDE 20-12.5 MG PO TABS: 1.0000 | ORAL_TABLET | Freq: Every day | ORAL | 1 refills | Status: DC

## 2019-02-03 NOTE — Telephone Encounter (Signed)
This Rx has been sent to the pharmacy for patient.

## 2019-03-15 DIAGNOSIS — H35361 Drusen (degenerative) of macula, right eye: Secondary | ICD-10-CM | POA: Diagnosis not present

## 2019-03-27 ENCOUNTER — Other Ambulatory Visit: Payer: Self-pay | Admitting: Family Medicine

## 2019-03-28 NOTE — Telephone Encounter (Signed)
Last OV 01/17/19, Next OV 08/02/19  Last filled 01/10/19, # 90 with 2 refills

## 2019-03-28 NOTE — Telephone Encounter (Signed)
Not due until end of month.  Cannot refill early.

## 2019-03-29 NOTE — Telephone Encounter (Signed)
Routing to PCP for approval.

## 2019-03-29 NOTE — Telephone Encounter (Signed)
This was already answered yesterday.  He is not due until end of month.  Cannot refill early.

## 2019-04-10 ENCOUNTER — Other Ambulatory Visit: Payer: Self-pay | Admitting: Family Medicine

## 2019-04-12 NOTE — Telephone Encounter (Signed)
[name] from the pharmacy calling to check on this.  Pt is wanting to pick up today.

## 2019-04-13 NOTE — Telephone Encounter (Signed)
[name] w/[facility] is checking on the status of prescription request for Alprazolam

## 2019-04-27 ENCOUNTER — Other Ambulatory Visit: Payer: Self-pay

## 2019-04-27 DIAGNOSIS — Z20822 Contact with and (suspected) exposure to covid-19: Secondary | ICD-10-CM

## 2019-04-28 LAB — NOVEL CORONAVIRUS, NAA: SARS-CoV-2, NAA: NOT DETECTED

## 2019-05-01 ENCOUNTER — Other Ambulatory Visit: Payer: Self-pay | Admitting: Cardiovascular Disease

## 2019-05-02 ENCOUNTER — Telehealth: Payer: Self-pay | Admitting: Family Medicine

## 2019-05-02 NOTE — Telephone Encounter (Signed)
Pt aware covid lab test negative, not detected °

## 2019-05-09 ENCOUNTER — Other Ambulatory Visit: Payer: Self-pay | Admitting: Family Medicine

## 2019-05-09 NOTE — Telephone Encounter (Signed)
Requested medication (s) are due for refill today: no  Requested medication (s) are on the active medication list: yes  Last refill:  06/24/2016  Future visit scheduled: yes  Notes to clinic:  Review for refill   Requested Prescriptions  Pending Prescriptions Disp Refills   nystatin-triamcinolone ointment (MYCOLOG) 30 g 3    Sig: Apply 1 application topically 2 (two) times daily.     Off-Protocol Failed - 05/09/2019  1:14 PM      Failed - Medication not assigned to a protocol, review manually.      Passed - Valid encounter within last 12 months    Recent Outpatient Visits          3 months ago Essential hypertension   Therapist, [facility] at [facility], Alinda Sierras, MD   9 months ago Essential hypertension   Therapist, [facility] at [facility], Alinda Sierras, MD   11 months ago [place] at [facility], Alinda Sierras, MD   1 year ago Depression, major, single episode, severe ([place])   Therapist, music at [facility], Alinda Sierras, MD   1 year ago Essential hypertension   Therapist, music at [facility], Alinda Sierras, MD      Future Appointments            In 2 months [place], Alinda Sierras, MD [place] [facility] [place], [facility]

## 2019-05-09 NOTE — Telephone Encounter (Signed)
Medication Refill - Medication: nystatin-triamcinolone ointment (MYCOLOG)  Has the patient contacted their pharmacy? unsure (Agent: If no, request that the patient contact the pharmacy for the refill.) (Agent: If yes, when and what did the pharmacy advise?)  Preferred Pharmacy (with phone number or street name):  [facility] - [place], [place] - [address] [phone] (Phone) [phone] (Fax)   Agent: Please be advised that RX refills may take up to 3 business days. We ask that you follow-up with your pharmacy.

## 2019-05-10 MED ORDER — NYSTATIN-TRIAMCINOLONE 100000-0.1 UNIT/GM-% EX OINT: 1.0000 "application " | TOPICAL_OINTMENT | Freq: Two times a day (BID) | CUTANEOUS | 3 refills | Status: DC

## 2019-05-10 NOTE — Telephone Encounter (Signed)
Refill OK

## 2019-05-13 ENCOUNTER — Other Ambulatory Visit: Payer: Self-pay | Admitting: Family Medicine

## 2019-06-07 ENCOUNTER — Other Ambulatory Visit: Payer: Self-pay | Admitting: Family Medicine

## 2019-06-12 ENCOUNTER — Other Ambulatory Visit: Payer: Self-pay | Admitting: Family Medicine

## 2019-06-12 NOTE — Telephone Encounter (Signed)
Please refuse this medication. System update will no longer allow me to refuse. Thanks!

## 2019-06-12 NOTE — Telephone Encounter (Signed)
Should still have one refill left.

## 2019-06-19 DIAGNOSIS — L570 Actinic keratosis: Secondary | ICD-10-CM | POA: Diagnosis not present

## 2019-06-19 DIAGNOSIS — C44319 Basal cell carcinoma of skin of other parts of face: Secondary | ICD-10-CM | POA: Diagnosis not present

## 2019-06-19 DIAGNOSIS — X32XXXA Exposure to sunlight, initial encounter: Secondary | ICD-10-CM | POA: Diagnosis not present

## 2019-07-10 ENCOUNTER — Other Ambulatory Visit: Payer: Self-pay | Admitting: Family Medicine

## 2019-07-10 NOTE — Telephone Encounter (Signed)
Last Rx for Alprazolam given on 10/1 for #90 with 2 ref-Mirtazapine given on 1/7/ for #90 with 3 ref

## 2019-07-31 ENCOUNTER — Other Ambulatory Visit: Payer: Self-pay | Admitting: Family Medicine

## 2019-08-02 ENCOUNTER — Ambulatory Visit (INDEPENDENT_AMBULATORY_CARE_PROVIDER_SITE_OTHER): Payer: Medicare Other | Admitting: Family Medicine

## 2019-08-02 ENCOUNTER — Other Ambulatory Visit: Payer: Self-pay

## 2019-08-02 ENCOUNTER — Encounter: Payer: Self-pay | Admitting: Family Medicine

## 2019-08-02 VITALS — BP 128/80 | HR 51 | Temp 97.1°F | Wt 191.2 lb

## 2019-08-02 DIAGNOSIS — Z8679 Personal history of other diseases of the circulatory system: Secondary | ICD-10-CM | POA: Diagnosis not present

## 2019-08-02 DIAGNOSIS — I10 Essential (primary) hypertension: Secondary | ICD-10-CM

## 2019-08-02 DIAGNOSIS — F411 Generalized anxiety disorder: Secondary | ICD-10-CM

## 2019-08-02 DIAGNOSIS — R634 Abnormal weight loss: Secondary | ICD-10-CM | POA: Diagnosis not present

## 2019-08-02 LAB — HEPATIC FUNCTION PANEL
ALT: 18 U/L (ref 0–53)
AST: 27 U/L (ref 0–37)
Albumin: 3.9 g/dL (ref 3.5–5.2)
Alkaline Phosphatase: 75 U/L (ref 39–117)
Bilirubin, Direct: 0.2 mg/dL (ref 0.0–0.3)
Total Bilirubin: 0.7 mg/dL (ref 0.2–1.2)
Total Protein: 6.4 g/dL (ref 6.0–8.3)

## 2019-08-02 LAB — BASIC METABOLIC PANEL WITH GFR
BUN: 16 mg/dL (ref 6–23)
CO2: 34 meq/L — ABNORMAL HIGH (ref 19–32)
Calcium: 9.7 mg/dL (ref 8.4–10.5)
Chloride: 103 meq/L (ref 96–112)
Creatinine, Ser: 1.07 mg/dL (ref 0.40–1.50)
GFR: 65.23 mL/min
Glucose, Bld: 97 mg/dL (ref 70–99)
Potassium: 4 meq/L (ref 3.5–5.1)
Sodium: 141 meq/L (ref 135–145)

## 2019-08-02 LAB — TSH: TSH: 2.46 u[IU]/mL (ref 0.35–4.50)

## 2019-08-02 LAB — CBC WITH DIFFERENTIAL/PLATELET
Basophils Absolute: 0 10*3/uL (ref 0.0–0.1)
Basophils Relative: 0.6 % (ref 0.0–3.0)
Eosinophils Absolute: 0.1 10*3/uL (ref 0.0–0.7)
Eosinophils Relative: 1.9 % (ref 0.0–5.0)
HCT: 42.6 % (ref 39.0–52.0)
Hemoglobin: 13.9 g/dL (ref 13.0–17.0)
Lymphocytes Relative: 30.6 % (ref 12.0–46.0)
Lymphs Abs: 1.6 10*3/uL (ref 0.7–4.0)
MCHC: 32.7 g/dL (ref 30.0–36.0)
MCV: 96.3 fl (ref 78.0–100.0)
Monocytes Absolute: 0.7 10*3/uL (ref 0.1–1.0)
Monocytes Relative: 13 % — ABNORMAL HIGH (ref 3.0–12.0)
Neutro Abs: 2.9 10*3/uL (ref 1.4–7.7)
Neutrophils Relative %: 53.9 % (ref 43.0–77.0)
Platelets: 122 10*3/uL — ABNORMAL LOW (ref 150.0–400.0)
RBC: 4.42 Mil/uL (ref 4.22–5.81)
RDW: 14.4 % (ref 11.5–15.5)
WBC: 5.4 10*3/uL (ref 4.0–10.5)

## 2019-08-02 NOTE — Progress Notes (Signed)
Subjective:     Patient ID: Randall Lara, male   DOB: February 21, 1930, 86 y.o.   MRN: [id number]  HPI Randall Lara is seen for medical follow-up.  His wife has advancing dementia and this has been very stressful for him.  He is the major caregiver.  He does not do a lot of cooking.  Generally eats 1-2 meals a day.  He has lost about 10 pounds from last year.  He states his appetite is actually fairly good and he thinks he has gained a few pounds back over recent weeks.  He has hypertension which is stable.  No recent orthostatic symptoms.  No recent falls.  He has history of osteoarthritis, atrial fibrillation, chronic anxiety, history of severe depression.  He is currently on multiple medications including Remeron, Wellbutrin, and alprazolam.  We have tried multiple times over the years to taper off his alprazolam without success.  He feels his current antidepression medication is working well.  No suicidal ideation.  Sleeping very well.  He has had some modest weight loss as above.  He denies any abdominal pain.  No headaches.  No chest pains.  No change in bowel habits.  Past Medical History:  Diagnosis Date  . Anxiety   . Arthritis   . Atrial fibrillation ([place])   . Depression   . Dysrhythmia    atrial fib  . GERD (gastroesophageal reflux disease)    no meds needed- diet controlled  . Heart palpitations   . Hypertension   . Joint pain   . Obesity    Past Surgical History:  Procedure Laterality Date  . CATARACT EXTRACTION Left   . CATARACT EXTRACTION W/PHACO Right 03/05/2016   Procedure: CATARACT EXTRACTION PHACO AND INTRAOCULAR LENS PLACEMENT ; CDE:  11.47;  Surgeon: Tonny Branch, MD;  Location: [facility];  Service: Ophthalmology;  Laterality: Right;  . ESOPHAGOGASTRODUODENOSCOPY  03/29/2002   [id number] esophagus, stomach, and duodenum through the second  portion.  . hernia repair Bilateral    Inguinal  . TOTAL KNEE ARTHROPLASTY Right 11/13/2015   Procedure: TOTAL KNEE ARTHROPLASTY;  Surgeon:  Latanya Maudlin, MD;  Location: [facility];  Service: Orthopedics;  Laterality: Right;    reports that he quit smoking about 44 years ago. His smoking use included cigarettes. He started smoking about 64 years ago. He has a 20.00 pack-year smoking history. He has never used smokeless tobacco. He reports current alcohol use. He reports that he does not use drugs. family history includes Heart attack in his brother; Hypertension in his mother and sister; Stroke in his father, mother, and sister. Allergies  Allergen Reactions  . Ferrous Sulfate     REACTION: swelling, hives. Doesn't recall.  . Sulfa Antibiotics Hives and Swelling     Review of Systems  Constitutional: Negative for appetite change, chills, fatigue and fever.  HENT: Negative for trouble swallowing.   Eyes: Negative for visual disturbance.  Respiratory: Negative for cough, chest tightness and shortness of breath.   Cardiovascular: Negative for chest pain, palpitations and leg swelling.  Gastrointestinal: Negative for abdominal pain.  Endocrine: Negative for polydipsia and polyuria.  Genitourinary: Negative for dysuria.  Neurological: Negative for dizziness, syncope, weakness, light-headedness and headaches.  Psychiatric/Behavioral: Negative for agitation, confusion and sleep disturbance.       Objective:   Physical Exam Vitals reviewed.  Constitutional:      Appearance: Normal appearance.  Cardiovascular:     Rate and Rhythm: Normal rate and regular rhythm.  Pulmonary:  Effort: Pulmonary effort is normal.     Breath sounds: Normal breath sounds.  Musculoskeletal:     Cervical back: Normal range of motion and neck supple.     Right lower leg: No edema.     Left lower leg: No edema.  Neurological:     General: No focal deficit present.     Mental Status: He is alert and oriented to person, place, and time.        Assessment:     #1 hypertension stable and at goal  #2 chronic atrial fibrillation.  Patient on  chronic anticoagulation with Eliquis.  Rate controlled  #3 history of chronic anxiety-stable  #4 history of recurrent depression currently stable on multidrug regimen as above  #5 weight loss.  Suspect related to situational stress and social changes as above.  Up until his wife's dementia as she was preparing most of their foods.    Plan:     -Check labs with TSH, CBC, basic metabolic panel, hepatic panel -Made some suggestions for healthy food options. -Discussed issues with Covid vaccine. -Set up 54-month follow-up and sooner as needed  Randall Post MD [facility]

## 2019-08-02 NOTE — Patient Instructions (Signed)
Alzheimer Disease Caregiver Guide  Alzheimer disease is a brain disease that causes memory loss and changes in behavior. People with Alzheimer disease often have problems paying attention, communicating, and doing routine tasks. The disease gets worse over time, and people with the disease eventually need full-time care. Taking care of someone with Alzheimer disease can be challenging and overwhelming. This guide provides helpful information and tips that can make caring for someone with the disease a little easier. What changes does Alzheimer disease cause? Alzheimer disease causes a person to lose the ability to remember things and make decisions. Memory loss and confusion are usually mild at the start of the disease, but they get more severe over time. Eventually, the person may not recognize friends, family members, or familiar places. Alzheimer disease can also cause hallucinations, changes in behavior, and changes in mood, such as anxiety or anger. The changes can come on suddenly. They may happen in response to something such as:  Pain.  An infection.  Changes in environment, such as changes in temperature or noise.  Overstimulation.  Feeling lost or scared. Tips for managing symptoms  Be calm and patient.  Give short, simple answers to questions. Long answers can overwhelm and confuse the person.  Avoid correcting the person in a negative way.  Try not to take things personally, even if the person forgets your name. Understand that changes are a part of the disease process.  Do not argue or try to convince the person about a specific point. Doing that may make the person feel more agitated. Tips for reducing frustration  Make appointments and do daily tasks, like bathing and dressing, when the person is at his or her best.  Allow for plenty of time for simple tasks because they may take longer than expected. Take your time when doing these tasks.  Limit the person's choices.  Too many choices can be overwhelming and stressful for the person.  Involve the person in what you are doing.  Keep a daily routine.  Avoid crowds and new situations, if possible.  Use simple words, short sentences, and a calm voice. Only give one direction at a time.  Buy clothes and shoes that are easy to put on and take off.  Organize medications in a pillbox for each day of the week.  Keep a calendar in a central location to remind the person of appointments or other activities.  Ask about respite care resources so that you can have a regular break from the stress of caregiving. Tips for reducing the risk of injury  Keep floors clear of clutter. Remove rugs, magazine racks, and floor lamps.  Keep hallways well-lit, especially at night.  Put a handrail   Put locks on doors. Put the locks in places where the person cannot see or reach them easily. This will help ensure that the person does not wander out of the house and get lost.  Be prepared for emergencies. Keep a list of emergency phone numbers and addresses in a convenient area.  Remove car keys and lock garage doors so that the person does not try to get in the car and drive.  A certain type of bracelet may be worn that tracks a person's location and identifies him or her as having memory problems. This should be worn at all times for safety. Tips for future planning  Discuss financial and legal planning early on in the course of the disease. People with Alzheimer disease will have trouble managing their  money as the disease gets worse. Get help from professional advisers regarding financial and legal matters.  Discuss advance directives, safety, and daily care. Take these steps: ? Create a living will and choose a power of attorney. The person with power of attorney will be able to make decisions for the person with Alzheimer disease when he or she is no longer able to. ? Discuss driving safety and when to stop driving.  The person's health care provider can help provide assistance with this decision. ? Discuss the person's living situation. If the person lives alone, make sure he or she is safe. People who live at home may need extra help from home health caregivers, and those who live in a nursing home or care center may need more care. Where to find support One way to find support is to join a local support group. Advantages of being part of a support group include:  Learning strategies to manage stress.  Sharing experiences with others.  Receiving emotional comfort and support.  Learning about caregiving as the disease progresses.  Knowing what community resources are available and making use of them. Where to find more information  Alzheimer's Association: [employer] Contact a health care provider if:  The person has a fever.  The person has a sudden change in behavior that does not improve with calming strategies.  The person is unable to manage in his or her current living situation.  The person threatens himself or herself, you, or anyone else.  You are no longer able to care for the person. Summary  Alzheimer disease is a brain disease that causes memory loss and changes in behavior.  People with Alzheimer disease often have problems paying attention, communicating, and doing routine tasks. The disease gets worse over time, and people with the disease eventually need full-time care.  Take steps to reduce the person's risk of injury, and plan for future care.  Taking care of someone with Alzheimer disease can be very challenging and overwhelming. One way to find support during this time is to join a local support group. This information is not intended to replace advice given to you by your health care provider. Make sure you discuss any questions you have with your health care provider. Document Revised: 10/18/2018 Document Reviewed: 07/31/2016 Elsevier Patient Education  [place].

## 2019-09-08 ENCOUNTER — Other Ambulatory Visit: Payer: Self-pay | Admitting: Family Medicine

## 2019-09-09 NOTE — Telephone Encounter (Signed)
OK to refill for 6 months 

## 2019-10-03 ENCOUNTER — Other Ambulatory Visit: Payer: Self-pay | Admitting: Family Medicine

## 2019-10-03 NOTE — Telephone Encounter (Signed)
Last ov:08/02/19 Last filled:07/09/20

## 2019-10-11 ENCOUNTER — Other Ambulatory Visit: Payer: Self-pay | Admitting: Family Medicine

## 2019-12-09 ENCOUNTER — Other Ambulatory Visit: Payer: Self-pay | Admitting: Family Medicine

## 2020-01-17 NOTE — Progress Notes (Deleted)
Cardiology Office Note  Date: 01/17/2020   ID: Randall Lara, DOB 1929-09-18, MRN [id number]  PCP:  [name], MD  Cardiologist:  Kate Sable, MD Electrophysiologist:  None   Chief Complaint: Follow-up permanent atrial fibrillation  History of Present Illness: Randall Lara is a 86 y.o. male with a history of permanent atrial fibrillation.  History of HTN, GERD, obesity, depression, arthritis.  Last seen by Dr. Bronson Ing 01/18/2019 via telemedicine visit.  At that time he denied any chest pain or shortness of breath.  He had occasional palpitation.  Was getting confused at times and admitted to mild dementia.  Past Medical History:  Diagnosis Date  . Anxiety   . Arthritis   . Atrial fibrillation ([place])   . Depression   . Dysrhythmia    atrial fib  . GERD (gastroesophageal reflux disease)    no meds needed- diet controlled  . Heart palpitations   . Hypertension   . Joint pain   . Obesity     Past Surgical History:  Procedure Laterality Date  . CATARACT EXTRACTION Left   . CATARACT EXTRACTION W/PHACO Right 03/05/2016   Procedure: CATARACT EXTRACTION PHACO AND INTRAOCULAR LENS PLACEMENT ; CDE:  11.47;  Surgeon: Tonny Branch, MD;  Location: [facility];  Service: Ophthalmology;  Laterality: Right;  . ESOPHAGOGASTRODUODENOSCOPY  03/29/2002   JFH:LKTGYB esophagus, stomach, and duodenum through the second  portion.  . hernia repair Bilateral    Inguinal  . TOTAL KNEE ARTHROPLASTY Right 11/13/2015   Procedure: TOTAL KNEE ARTHROPLASTY;  Surgeon: Latanya Maudlin, MD;  Location: [facility];  Service: Orthopedics;  Laterality: Right;    Current Outpatient Medications  Medication Sig Dispense Refill  . ALPRAZolam (XANAX) 1 MG tablet TAKE ONE TABLET BY MOUTH THREE TIMES DAILY 90 tablet 2  . ARIPiprazole (ABILIFY) 2 MG tablet TAKE ONE TABLET BY MOUTH DAILY. 30 tablet 6  . benazepril-hydrochlorthiazide (LOTENSIN HCT) 20-12.5 MG tablet TAKE ONE TABLET BY MOUTH DAILY. 90 tablet 0  .  buPROPion (WELLBUTRIN XL) 300 MG 24 hr tablet TAKE ONE TABLET BY MOUTH DAILY. 90 tablet 1  . ELIQUIS 5 MG TABS tablet TAKE ONE TABLET BY MOUTH TWICE DAILY. 60 tablet 6  . mirtazapine (REMERON SOL-TAB) 30 MG disintegrating tablet TAKE ONE TABLET BY MOUTH AT BEDTIME. 90 tablet 3  . nitroGLYCERIN (NITROSTAT) 0.4 MG SL tablet DISSOLVE ONE TABLET UNDER TONGUE EVERY 5 MINUTES UP TO 3 DOSES AS NEEDED FOR CHEST PAIN. DO NOT EXCEED A TOTAL OF 3 DOSES IN 15 MINUTES. 25 tablet 2  . nystatin-triamcinolone ointment (MYCOLOG) Apply 1 application topically 2 (two) times daily. 30 g 3   No current facility-administered medications for this visit.   Allergies:  Ferrous sulfate and Sulfa antibiotics   Social History: The patient  reports that he quit smoking about 44 years ago. His smoking use included cigarettes. He started smoking about 64 years ago. He has a 20.00 pack-year smoking history. He has never used smokeless tobacco. He reports current alcohol use. He reports that he does not use drugs.   Family History: The patient's family history includes Heart attack in his brother; Hypertension in his mother and sister; Stroke in his father, mother, and sister.   ROS:  Please see the history of present illness. Otherwise, complete review of systems is positive for {NONE DEFAULTED:18576::"none"}.  All other systems are reviewed and negative.   Physical Exam: VS:  There were no vitals taken for this visit., BMI There is no height  or weight on file to calculate BMI.  Wt Readings from Last 3 Encounters:  08/02/19 191 lb 3.2 oz (86.7 kg)  01/18/19 193 lb (87.5 kg)  07/19/18 201 lb 6.4 oz (91.4 kg)    General: Patient appears comfortable at rest. HEENT: Conjunctiva and lids normal, oropharynx clear with moist mucosa. Neck: Supple, no elevated JVP or carotid bruits, no thyromegaly. Lungs: Clear to auscultation, nonlabored breathing at rest. Cardiac: Regular rate and rhythm, no S3 or significant systolic murmur,  no pericardial rub. Abdomen: Soft, nontender, no hepatomegaly, bowel sounds present, no guarding or rebound. Extremities: No pitting edema, distal pulses 2+. Skin: Warm and dry. Musculoskeletal: No kyphosis. Neuropsychiatric: Alert and oriented x3, affect grossly appropriate.  ECG:  {EKG/Telemetry Strips [phone]}  Recent Labwork: 08/02/2019: ALT 18; AST 27; BUN 16; Creatinine, Ser 1.07; Hemoglobin 13.9; Platelets 122.0; Potassium 4.0; Sodium 141; TSH 2.46  No results found for: CHOL, TRIG, HDL, CHOLHDL, VLDL, LDLCALC, LDLDIRECT  Other Studies Reviewed Today:  Echocardiogram on 07/01/10 demonstrated normal left ventricular systolic function, EF 57-84%, with mild aortic regurgitation.  Low risk nuclear stress test on 07/07/13.   Assessment and Plan:  1. Permanent atrial fibrillation ([place])   2. Essential hypertension    1. Permanent atrial fibrillation ([facility]) ***  2. Essential hypertension ***  Medication Adjustments/Labs and Tests Ordered: Current medicines are reviewed at length with the patient today.  Concerns regarding medicines are outlined above.   Disposition: Follow-up with ***  Signed, Levell July, NP 01/17/2020 8:28 AM    [facility], [place], [place] [address] Phone: [phone]; Fax: [phone]

## 2020-01-18 ENCOUNTER — Ambulatory Visit: Payer: Medicare Other | Admitting: Family Medicine

## 2020-02-08 ENCOUNTER — Other Ambulatory Visit: Payer: Self-pay | Admitting: Family Medicine

## 2020-04-01 DIAGNOSIS — X32XXXD Exposure to sunlight, subsequent encounter: Secondary | ICD-10-CM | POA: Diagnosis not present

## 2020-04-01 DIAGNOSIS — L570 Actinic keratosis: Secondary | ICD-10-CM | POA: Diagnosis not present

## 2020-04-03 ENCOUNTER — Other Ambulatory Visit: Payer: Self-pay | Admitting: Family Medicine

## 2020-04-03 NOTE — Telephone Encounter (Signed)
Refilled for 3 months.  Will need office follow up before further refills.

## 2020-05-13 ENCOUNTER — Other Ambulatory Visit: Payer: Self-pay | Admitting: Family Medicine

## 2020-05-13 NOTE — Telephone Encounter (Signed)
Refilled for 3 months.  Needs office follow up by January 2022.

## 2020-05-15 ENCOUNTER — Telehealth: Payer: Self-pay | Admitting: Family Medicine

## 2020-05-15 NOTE — Telephone Encounter (Signed)
Left message for patient to call back and schedule Medicare Annual Wellness Visit (AWV) either virtually or in office.  Last AWV 08/11/2017  Please schedule at any time with [facility] Nurse Health Advisor 1.  This should be a 45 minute visit.

## 2020-06-03 ENCOUNTER — Other Ambulatory Visit: Payer: Self-pay | Admitting: Family Medicine

## 2020-07-02 ENCOUNTER — Other Ambulatory Visit: Payer: Self-pay | Admitting: Family Medicine

## 2020-07-16 ENCOUNTER — Encounter: Payer: Self-pay | Admitting: Family Medicine

## 2020-07-16 ENCOUNTER — Other Ambulatory Visit: Payer: Self-pay

## 2020-07-16 ENCOUNTER — Ambulatory Visit (INDEPENDENT_AMBULATORY_CARE_PROVIDER_SITE_OTHER): Payer: Medicare Other | Admitting: Family Medicine

## 2020-07-16 VITALS — BP 140/80 | HR 56 | Ht 72.0 in | Wt 193.0 lb

## 2020-07-16 DIAGNOSIS — Z23 Encounter for immunization: Secondary | ICD-10-CM

## 2020-07-16 DIAGNOSIS — Z8679 Personal history of other diseases of the circulatory system: Secondary | ICD-10-CM | POA: Diagnosis not present

## 2020-07-16 DIAGNOSIS — F339 Major depressive disorder, recurrent, unspecified: Secondary | ICD-10-CM | POA: Insufficient documentation

## 2020-07-16 DIAGNOSIS — I10 Essential (primary) hypertension: Secondary | ICD-10-CM | POA: Diagnosis not present

## 2020-07-16 DIAGNOSIS — R7303 Prediabetes: Secondary | ICD-10-CM

## 2020-07-16 LAB — CBC WITH DIFFERENTIAL/PLATELET
Basophils Absolute: 0 10*3/uL (ref 0.0–0.1)
Basophils Relative: 0.4 % (ref 0.0–3.0)
Eosinophils Absolute: 0.1 10*3/uL (ref 0.0–0.7)
Eosinophils Relative: 1.8 % (ref 0.0–5.0)
HCT: 40.3 % (ref 39.0–52.0)
Hemoglobin: 13.5 g/dL (ref 13.0–17.0)
Lymphocytes Relative: 27.8 % (ref 12.0–46.0)
Lymphs Abs: 1.6 10*3/uL (ref 0.7–4.0)
MCHC: 33.5 g/dL (ref 30.0–36.0)
MCV: 95.2 fl (ref 78.0–100.0)
Monocytes Absolute: 0.5 10*3/uL (ref 0.1–1.0)
Monocytes Relative: 9 % (ref 3.0–12.0)
Neutro Abs: 3.4 10*3/uL (ref 1.4–7.7)
Neutrophils Relative %: 61 % (ref 43.0–77.0)
Platelets: 132 10*3/uL — ABNORMAL LOW (ref 150.0–400.0)
RBC: 4.23 Mil/uL (ref 4.22–5.81)
RDW: 13.8 % (ref 11.5–15.5)
WBC: 5.6 10*3/uL (ref 4.0–10.5)

## 2020-07-16 LAB — BASIC METABOLIC PANEL
BUN: 19 mg/dL (ref 6–23)
CO2: 32 mEq/L (ref 19–32)
Calcium: 9.8 mg/dL (ref 8.4–10.5)
Chloride: 102 mEq/L (ref 96–112)
Creatinine, Ser: 1.32 mg/dL (ref 0.40–1.50)
GFR: 48.03 mL/min — ABNORMAL LOW (ref >60.00)
Glucose, Bld: 100 mg/dL — ABNORMAL HIGH (ref 70–99)
Potassium: 4.4 mEq/L (ref 3.5–5.1)
Sodium: 141 mEq/L (ref 135–145)

## 2020-07-16 LAB — HEMOGLOBIN A1C: Hgb A1c MFr Bld: 5.7 % (ref 4.6–6.5)

## 2020-07-16 NOTE — Addendum Note (Signed)
Addended by: Judd Gaudier on: 07/16/2020 02:11 PM   Modules accepted: Orders

## 2020-07-16 NOTE — Patient Instructions (Signed)

## 2020-07-16 NOTE — Progress Notes (Signed)
Established Patient Office Visit  Subjective:  Patient ID: Randall Lara, male    DOB: 05-09-1930  Age: 87 y.o. MRN: [id number]  CC:  Chief Complaint  Patient presents with  . Medication Management  . Depression    HPI Randall Lara presents for medical follow up.  His medical problems include history of hypertension, GERD, history of recurrent depression, atrial fibrillation, hyperglycemia.  His wife has dementia and he stays very busy caring for her.  He no longer drives.  Denies any recent falls.  No recent dizziness or chest pain.  Generally doing well.  Current medications include mirtazapine, Eliquis, bupropion, benazepril HCTZ, Abilify, Xanax.  He has had extremely difficult to control anxiety in the past and feels like his current regimen is working fairly well.  His appetite and weight are stable compared with last year.  No suicidal ideation.  Has reportedly had Covid vaccines but has not had flu vaccine yet.  He is willing to get this today.  Past Medical History:  Diagnosis Date  . Anxiety   . Arthritis   . Atrial fibrillation ([facility])   . Depression   . Dysrhythmia    atrial fib  . GERD (gastroesophageal reflux disease)    no meds needed- diet controlled  . Heart palpitations   . Hypertension   . Joint pain   . Obesity     Past Surgical History:  Procedure Laterality Date  . CATARACT EXTRACTION Left   . CATARACT EXTRACTION W/PHACO Right 03/05/2016   Procedure: CATARACT EXTRACTION PHACO AND INTRAOCULAR LENS PLACEMENT ; CDE:  11.47;  Surgeon: Gemma Payor, MD;  Location: [facility];  Service: Ophthalmology;  Laterality: Right;  . ESOPHAGOGASTRODUODENOSCOPY  03/29/2002   EPP:IRJJOA esophagus, stomach, and duodenum through the second  portion.  . hernia repair Bilateral    Inguinal  . TOTAL KNEE ARTHROPLASTY Right 11/13/2015   Procedure: TOTAL KNEE ARTHROPLASTY;  Surgeon: Ranee Gosselin, MD;  Location: [facility];  Service: Orthopedics;  Laterality: Right;    Family History   Problem Relation Age of Onset  . Stroke Mother   . Hypertension Mother   . Stroke Father   . Stroke Sister   . Heart attack Brother   . Hypertension Sister   . Colon cancer Neg Hx     Social History   Socioeconomic History  . Marital status: Married    Spouse name: Not on file  . Number of children: Not on file  . Years of education: Not on file  . Highest education level: Not on file  Occupational History  . Not on file  Tobacco Use  . Smoking status: Former Smoker    Packs/day: 1.00    Years: 20.00    Pack years: 20.00    Types: Cigarettes    Start date: 08/07/1955    Quit date: 08/07/1975    Years since quitting: 44.9  . Smokeless tobacco: Never Used  Vaping Use  . Vaping Use: Never used  Substance and Sexual Activity  . Alcohol use: Yes    Alcohol/week: 0.0 standard drinks    Comment: occassional  . Drug use: No  . Sexual activity: Yes    Birth control/protection: None  Other Topics Concern  . Not on file  Social History Narrative  . Not on file   Social Determinants of Health   Financial Resource Strain: Not on file  Food Insecurity: Not on file  Transportation Needs: Not on file  Physical Activity: Not on file  Stress: Not on file  Social Connections: Not on file  Intimate Partner Violence: Not on file    Outpatient Medications Prior to Visit  Medication Sig Dispense Refill  . ALPRAZolam (XANAX) 1 MG tablet TAKE ONE TABLET BY MOUTH THREE TIMES DAILY 90 tablet 2  . ARIPiprazole (ABILIFY) 2 MG tablet TAKE ONE TABLET BY MOUTH DAILY. 30 tablet 2  . benazepril-hydrochlorthiazide (LOTENSIN HCT) 20-12.5 MG tablet TAKE ONE TABLET BY MOUTH DAILY. **Needs appt for further refills** 90 tablet 0  . buPROPion (WELLBUTRIN XL) 300 MG 24 hr tablet TAKE ONE TABLET BY MOUTH DAILY 90 tablet 1  . ELIQUIS 5 MG TABS tablet TAKE ONE TABLET BY MOUTH TWICE DAILY. 60 tablet 2  . mirtazapine (REMERON SOL-TAB) 30 MG disintegrating tablet DISSOLVE ONE TABLET IN MOUTH AT  BEDTIME. 90 tablet 0  . nitroGLYCERIN (NITROSTAT) 0.4 MG SL tablet DISSOLVE ONE TABLET UNDER TONGUE EVERY 5 MINUTES UP TO 3 DOSES AS NEEDED FOR CHEST PAIN. DO NOT EXCEED A TOTAL OF 3 DOSES IN 15 MINUTES. 25 tablet 2  . nystatin-triamcinolone ointment (MYCOLOG) Apply 1 application topically 2 (two) times daily. 30 g 3   No facility-administered medications prior to visit.    Allergies  Allergen Reactions  . Ferrous Sulfate     REACTION: swelling, hives. Doesn't recall.  . Sulfa Antibiotics Hives and Swelling    ROS Review of Systems  Constitutional: Negative for appetite change, fatigue, fever and unexpected weight change.  Eyes: Negative for visual disturbance.  Respiratory: Negative for cough, chest tightness and shortness of breath.   Cardiovascular: Negative for chest pain, palpitations and leg swelling.  Endocrine: Negative for polydipsia and polyuria.  Genitourinary: Negative for dysuria.  Neurological: Negative for dizziness, syncope, weakness, light-headedness and headaches.      Objective:    Physical Exam Vitals reviewed.  Constitutional:      Appearance: Normal appearance.  Cardiovascular:     Comments: Irregular rhythm but rate controlled with rate of 56-60 Pulmonary:     Effort: Pulmonary effort is normal.     Breath sounds: Normal breath sounds.  Musculoskeletal:     Cervical back: Neck supple.     Right lower leg: No edema.     Left lower leg: No edema.  Neurological:     Mental Status: He is alert.     BP 140/80   Pulse (!) 56   Ht 6' (1.829 m)   Wt 193 lb (87.5 kg)   SpO2 96%   BMI 26.18 kg/m  Wt Readings from Last 3 Encounters:  07/16/20 193 lb (87.5 kg)  08/02/19 191 lb 3.2 oz (86.7 kg)  01/18/19 193 lb (87.5 kg)     Health Maintenance Due  Topic Date Due  . COVID-19 Vaccine (1) Never done  . PNA vac Low Risk Adult (2 of 2 - PPSV23) 05/16/2015  . TETANUS/TDAP  11/12/2019  . INFLUENZA VACCINE  02/11/2020    There are no preventive  care reminders to display for this patient.  Lab Results  Component Value Date   TSH 2.46 08/02/2019   Lab Results  Component Value Date   WBC 5.4 08/02/2019   HGB 13.9 08/02/2019   HCT 42.6 08/02/2019   MCV 96.3 08/02/2019   PLT 122.0 (L) 08/02/2019   Lab Results  Component Value Date   NA 141 08/02/2019   K 4.0 08/02/2019   CO2 34 (H) 08/02/2019   GLUCOSE 97 08/02/2019   BUN 16 08/02/2019   CREATININE 1.07 08/02/2019  BILITOT 0.7 08/02/2019   ALKPHOS 75 08/02/2019   AST 27 08/02/2019   ALT 18 08/02/2019   PROT 6.4 08/02/2019   ALBUMIN 3.9 08/02/2019   CALCIUM 9.7 08/02/2019   ANIONGAP 7 02/28/2016   GFR 65.23 08/02/2019   No results found for: CHOL No results found for: HDL No results found for: LDLCALC No results found for: TRIG No results found for: CHOLHDL Lab Results  Component Value Date   HGBA1C 6.2 11/14/2013      Assessment & Plan:   #1 hypertension stable  -Continue lisinopril HCTZ -Recheck basic metabolic panel  #2 history of atrial fibrillation currently maintained on Eliquis.  Rate controlled. -Check basic metabolic panel and CBC  #3 history of recurrent depression/anxiety currently stable -Continue current medication regimen  #4 history of prediabetes -Check hemoglobin A1c  #5 health maintenance.  Needs flu vaccine -High-dose flu vaccine given  No orders of the defined types were placed in this encounter.   Follow-up: Return in about 1 year (around 07/16/2021).    Evelena Peat, MD

## 2020-07-17 ENCOUNTER — Telehealth: Payer: Self-pay

## 2020-07-17 NOTE — Telephone Encounter (Signed)
-----   Message from Kristian Covey, MD sent at 07/16/2020  7:59 PM EST ----- Blood sugar stable.  Kidney function slightly worse.   Stay well hydrated and avoid all NSAIDS (eg Advil, Aleve)

## 2020-07-17 NOTE — Telephone Encounter (Signed)
Left message for patient to call back regarding results and recommendations. °

## 2020-08-06 ENCOUNTER — Other Ambulatory Visit: Payer: Self-pay | Admitting: Family Medicine

## 2020-08-10 ENCOUNTER — Other Ambulatory Visit: Payer: Self-pay | Admitting: Family Medicine

## 2020-08-23 ENCOUNTER — Telehealth: Payer: Self-pay | Admitting: Family Medicine

## 2020-08-23 NOTE — Telephone Encounter (Signed)
Left message for patient to call back and schedule Medicare Annual Wellness Visit (AWV) either virtually or in office. Did not leave detailed message   Last AWV 08/11/17  please schedule at anytime with [facility] Nurse Health Advisor 1 or 2   This should be a 45 minute visit.

## 2020-09-04 ENCOUNTER — Other Ambulatory Visit: Payer: Self-pay | Admitting: Family Medicine

## 2020-09-11 ENCOUNTER — Ambulatory Visit: Payer: Medicare Other

## 2020-09-23 DIAGNOSIS — L110 Acquired keratosis follicularis: Secondary | ICD-10-CM | POA: Diagnosis not present

## 2020-09-23 DIAGNOSIS — M79672 Pain in left foot: Secondary | ICD-10-CM | POA: Diagnosis not present

## 2020-09-23 DIAGNOSIS — M79675 Pain in left toe(s): Secondary | ICD-10-CM | POA: Diagnosis not present

## 2020-09-23 DIAGNOSIS — M79671 Pain in right foot: Secondary | ICD-10-CM | POA: Diagnosis not present

## 2020-09-23 DIAGNOSIS — I739 Peripheral vascular disease, unspecified: Secondary | ICD-10-CM | POA: Diagnosis not present

## 2020-09-23 DIAGNOSIS — M79674 Pain in right toe(s): Secondary | ICD-10-CM | POA: Diagnosis not present

## 2020-10-01 NOTE — Progress Notes (Signed)
Subjective:   Randall Lara is a 87 y.o. male who presents for Medicare Annual/Subsequent preventive examination.  I connected with Barth Kirks today by telephone and verified that I am speaking with the correct person using two identifiers. Location patient: home Location provider: work Persons participating in the virtual visit: patient, provider.   I discussed the limitations, risks, security and privacy concerns of performing an evaluation and management service by telephone and the availability of in person appointments. I also discussed with the patient that there may be a patient responsible charge related to this service. The patient expressed understanding and verbally consented to this telephonic visit.    Interactive audio and video telecommunications were attempted between this provider and patient, however failed, due to patient having technical difficulties OR patient did not have access to video capability.  We continued and completed visit with audio only.      Review of Systems    N/A  Cardiac Risk Factors include: male gender;advanced age (>31men, >94 women);hypertension;dyslipidemia     Objective:    Today's Vitals   There is no height or weight on file to calculate BMI.  Advanced Directives 10/02/2020 08/11/2017 02/28/2016 12/03/2015 11/13/2015 11/13/2015 11/05/2015  Does Patient Have a Medical Advance Directive? Yes Yes Yes Yes Yes - Yes  Type of Advance Directive Living will;Healthcare Power of Attorney - Living will Living will Living will - Living will  Does patient want to make changes to medical advance directive? No - Patient declined - No - Patient declined - No - Patient declined - No - Patient declined  Copy of [place] in Chart? No - copy requested - Yes - No - copy requested No - copy requested No - copy requested    Current Medications (verified) Outpatient Encounter Medications as of 10/02/2020  Medication Sig  . ALPRAZolam (XANAX) 1  MG tablet TAKE ONE TABLET BY MOUTH THREE TIMES DAILY  . ARIPiprazole (ABILIFY) 2 MG tablet TAKE ONE TABLET BY MOUTH DAILY  . benazepril-hydrochlorthiazide (LOTENSIN HCT) 20-12.5 MG tablet TAKE ONE TABLET BY MOUTH DAILY  . buPROPion (WELLBUTRIN XL) 300 MG 24 hr tablet TAKE ONE TABLET BY MOUTH DAILY  . ELIQUIS 5 MG TABS tablet TAKE ONE TABLET BY MOUTH TWICE DAILY  . mirtazapine (REMERON SOL-TAB) 30 MG disintegrating tablet DISSOLVE ONE TABLET IN MOUTH AT BEDTIME.  Marland Kitchen nystatin-triamcinolone ointment (MYCOLOG) Apply 1 application topically 2 (two) times daily.  . nitroGLYCERIN (NITROSTAT) 0.4 MG SL tablet DISSOLVE ONE TABLET UNDER TONGUE EVERY 5 MINUTES UP TO 3 DOSES AS NEEDED FOR CHEST PAIN. DO NOT EXCEED A TOTAL OF 3 DOSES IN 15 MINUTES. (Patient not taking: Reported on 10/02/2020)   No facility-administered encounter medications on file as of 10/02/2020.    Allergies (verified) Ferrous sulfate and Sulfa antibiotics   History: Past Medical History:  Diagnosis Date  . Anxiety   . Arthritis   . Atrial fibrillation ([place])   . Depression   . Dysrhythmia    atrial fib  . GERD (gastroesophageal reflux disease)    no meds needed- diet controlled  . Heart palpitations   . Hypertension   . Joint pain   . Obesity    Past Surgical History:  Procedure Laterality Date  . CATARACT EXTRACTION Left   . CATARACT EXTRACTION W/PHACO Right 03/05/2016   Procedure: CATARACT EXTRACTION PHACO AND INTRAOCULAR LENS PLACEMENT ; CDE:  11.47;  Surgeon: Tonny Branch, MD;  Location: [facility];  Service: Ophthalmology;  Laterality: Right;  .  ESOPHAGOGASTRODUODENOSCOPY  03/29/2002   XIP:JASNKN esophagus, stomach, and duodenum through the second  portion.  . hernia repair Bilateral    Inguinal  . TOTAL KNEE ARTHROPLASTY Right 11/13/2015   Procedure: TOTAL KNEE ARTHROPLASTY;  Surgeon: Latanya Maudlin, MD;  Location: [facility];  Service: Orthopedics;  Laterality: Right;   Family History  Problem Relation Age of Onset  .  Stroke Mother   . Hypertension Mother   . Stroke Father   . Stroke Sister   . Heart attack Brother   . Hypertension Sister   . Colon cancer Neg Hx    Social History   Socioeconomic History  . Marital status: Married    Spouse name: Not on file  . Number of children: Not on file  . Years of education: Not on file  . Highest education level: Not on file  Occupational History  . Not on file  Tobacco Use  . Smoking status: Former Smoker    Packs/day: 1.00    Years: 20.00    Pack years: 20.00    Types: Cigarettes    Start date: 08/07/1955    Quit date: 08/07/1975    Years since quitting: 45.1  . Smokeless tobacco: Never Used  Vaping Use  . Vaping Use: Never used  Substance and Sexual Activity  . Alcohol use: Yes    Alcohol/week: 0.0 standard drinks    Comment: occassional  . Drug use: No  . Sexual activity: Yes    Birth control/protection: None  Other Topics Concern  . Not on file  Social History Narrative  . Not on file   Social Determinants of Health   Financial Resource Strain: Low Risk   . Difficulty of Paying Living Expenses: Not hard at all  Food Insecurity: No Food Insecurity  . Worried About Charity fundraiser in the Last Year: Never true  . Ran Out of Food in the Last Year: Never true  Transportation Needs: No Transportation Needs  . Lack of Transportation (Medical): No  . Lack of Transportation (Non-Medical): No  Physical Activity: Inactive  . Days of Exercise per Week: 0 days  . Minutes of Exercise per Session: 0 min  Stress: No Stress Concern Present  . Feeling of Stress : Not at all  Social Connections: Moderately Isolated  . Frequency of Communication with Friends and Family: More than three times a week  . Frequency of Social Gatherings with Friends and Family: Three times a week  . Attends Religious Services: Never  . Active Member of Clubs or Organizations: No  . Attends Archivist Meetings: Never  . Marital Status: Married     Tobacco Counseling Counseling given: Not Answered   Clinical Intake:  Pre-visit preparation completed: Yes  Pain : No/denies pain     Nutritional Risks: None Diabetes: No  How often do you need to have someone help you when you read instructions, pamphlets, or other written materials from your doctor or pharmacy?: 1 - Never What is the last grade level you completed in school?: high school  Diabetic?No   Interpreter Needed?: No  Information entered by :: [place] of Daily Living In your present state of health, do you have any difficulty performing the following activities: 10/02/2020  Hearing? Y  Vision? N  Difficulty concentrating or making decisions? N  Walking or climbing stairs? N  Dressing or bathing? N  Doing errands, shopping? Y  Comment patient states no longer drives  Preparing Food and eating ?  N  Using the Toilet? N  In the past six months, have you accidently leaked urine? N  Do you have problems with loss of bowel control? N  Managing your Medications? N  Managing your Finances? N  Housekeeping or managing your Housekeeping? N  Some recent data might be hidden    Patient Care Team: [name], MD as PCP - General (Family Medicine) Herminio Commons, MD (Inactive) as PCP - Cardiology (Cardiology) Gala Romney Cristopher Estimable, MD as Attending Physician (Gastroenterology)  Indicate any recent Medical Services you may have received from other than [facility] providers in the past year (date may be approximate).     Assessment:   This is a routine wellness examination for Vimal.  Hearing/Vision screen  Hearing Screening   125Hz  250Hz  500Hz  1000Hz  2000Hz  3000Hz  4000Hz  6000Hz  8000Hz   Right ear:           Left ear:           Vision Screening Comments: Gets eye exams annually, wears glasses.   Dietary issues and exercise activities discussed: Current Exercise Habits: The patient does not participate in regular exercise at present,  Exercise limited by: None identified  Goals    . Patient Stated     Continues to engage in cars        Depression Screen PHQ 2/9 Scores 10/02/2020 03/09/2018 02/08/2017 05/20/2015 05/15/2014 05/15/2014 05/15/2013  PHQ - 2 Score 6 3 0 6 0 0 0  PHQ- 9 Score 7 22 - 12 - - -    Fall Risk Fall Risk  10/02/2020 08/11/2017 08/11/2017 02/08/2017 05/20/2015  Falls in the past year? 1 No No No Yes  Number falls in past yr: 1 - - - 1  Injury with Fall? 0 - - - Yes  Risk for fall due to : History of fall(s);Impaired balance/gait - - - Other (Comment)  Follow up Falls evaluation completed;Falls prevention discussed - - - Falls prevention discussed;Follow up appointment    FALL RISK PREVENTION PERTAINING TO THE HOME:  Any stairs in or around the home? No  If so, are there any without handrails? No  Home free of loose throw rugs in walkways, pet beds, electrical cords, etc? Yes  Adequate lighting in your home to reduce risk of falls? Yes   ASSISTIVE DEVICES UTILIZED TO PREVENT FALLS:  Life alert? No  Use of a cane, walker or w/c? No  Grab bars in the bathroom? Yes  Shower chair or bench in shower? No  Elevated toilet seat or a handicapped toilet? Yes    Cognitive Function:   Normal cognitive status assessed by direct observation by this Nurse Health Advisor. No abnormalities found.        Immunizations Immunization History  Administered Date(s) Administered  . Fluad Quad(high Dose 65+) 07/16/2020  . Influenza Split 05/08/2011, 05/08/2013  . Influenza, High Dose Seasonal PF 04/30/2017, 04/20/2018, 05/05/2019  . Influenza,inj,Quad PF,6+ Mos 03/28/2019  . Influenza-Unspecified 05/09/2014, 04/30/2015, 05/02/2016, 04/12/2017  . Moderna Sars-Covid-2 Vaccination 08/28/2019, 09/25/2019, 07/02/2020  . Pneumococcal Conjugate-13 07/14/2003, 05/15/2014  . Td 11/11/2009    TDAP status: Due, Education has been provided regarding the importance of this vaccine. Advised may receive this vaccine at  local pharmacy or Health Dept. Aware to provide a copy of the vaccination record if obtained from local pharmacy or Health Dept. Verbalized acceptance and understanding.  Flu Vaccine status: Up to date  Pneumococcal vaccine status: Due, Education has been provided regarding the importance of this vaccine. Advised  may receive this vaccine at local pharmacy or Health Dept. Aware to provide a copy of the vaccination record if obtained from local pharmacy or Health Dept. Verbalized acceptance and understanding.  Covid-19 vaccine status: Completed vaccines  Qualifies for Shingles Vaccine? Yes   Zostavax completed No   Shingrix Completed?: No.    Education has been provided regarding the importance of this vaccine. Patient has been advised to call insurance company to determine out of pocket expense if they have not yet received this vaccine. Advised may also receive vaccine at local pharmacy or Health Dept. Verbalized acceptance and understanding.  Screening Tests Health Maintenance  Topic Date Due  . PNA vac Low Risk Adult (2 of 2 - PPSV23) 05/16/2015  . TETANUS/TDAP  11/12/2019  . COVID-19 Vaccine (4 - Booster for Moderna series) 12/31/2020  . INFLUENZA VACCINE  Completed  . HPV VACCINES  Aged Out    Health Maintenance  Health Maintenance Due  Topic Date Due  . PNA vac Low Risk Adult (2 of 2 - PPSV23) 05/16/2015  . TETANUS/TDAP  11/12/2019    Colorectal cancer screening: No longer required.   Lung Cancer Screening: (Low Dose CT Chest recommended if Age 3-80 years, 30 pack-year currently smoking OR have quit w/in 15years.) does not qualify.   Lung Cancer Screening Referral: N/A   Additional Screening:  Hepatitis C Screening: does not qualify;  Vision Screening: Recommended annual ophthalmology exams for early detection of glaucoma and other disorders of the eye. Is the patient up to date with their annual eye exam?  Yes  Who is the provider or what is the name of the office in  which the patient attends annual eye exams? Dr. Radford Pax  If pt is not established with a provider, would they like to be referred to a provider to establish care? No .   Dental Screening: Recommended annual dental exams for proper oral hygiene  Community Resource Referral / Chronic Care Management: CRR required this visit?  No   CCM required this visit?  No      Plan:     I have personally reviewed and noted the following in the patient's chart:   . Medical and social history . Use of alcohol, tobacco or illicit drugs  . Current medications and supplements . Functional ability and status . Nutritional status . Physical activity . Advanced directives . List of other physicians . Hospitalizations, surgeries, and ER visits in previous 12 months . Vitals . Screenings to include cognitive, depression, and falls . Referrals and appointments  In addition, I have reviewed and discussed with patient certain preventive protocols, quality metrics, and best practice recommendations. A written personalized care plan for preventive services as well as general preventive health recommendations were provided to patient.     Ofilia Neas, LPN   [id number]   Nurse Notes: None

## 2020-10-02 ENCOUNTER — Ambulatory Visit (INDEPENDENT_AMBULATORY_CARE_PROVIDER_SITE_OTHER): Payer: Medicare Other

## 2020-10-02 ENCOUNTER — Other Ambulatory Visit: Payer: Self-pay

## 2020-10-02 DIAGNOSIS — Z0000 Encounter for general adult medical examination without abnormal findings: Secondary | ICD-10-CM

## 2020-10-02 NOTE — Patient Instructions (Signed)
Randall Lara , Thank you for taking time to come for your Medicare Wellness Visit. I appreciate your ongoing commitment to your health goals. Please review the following plan we discussed and let me know if I can assist you in the future.   Screening recommendations/referrals: Colonoscopy: No longer required  Recommended yearly ophthalmology/optometry visit for glaucoma screening and checkup Recommended yearly dental visit for hygiene and checkup  Vaccinations: Influenza vaccine: Up to date, next due fall 2022  Pneumococcal vaccine: Currently due for Pneumovax 23 or Prevnar 20 Tdap vaccine: Currently due, you may await and injury Shingles vaccine: Currently due for Shingrix, if you would like to receive we recommend that you   Advanced directives: Please bring in a copy of your advanced medical directives so that we may scan them into your chart.  Conditions/risks identified: None   Next appointment: None   Preventive Care 65 Years and Older, Male Preventive care refers to lifestyle choices and visits with your health care provider that can promote health and wellness. What does preventive care include?  A yearly physical exam. This is also called an annual well check.  Dental exams once or twice a year.  Routine eye exams. Ask your health care provider how often you should have your eyes checked.  Personal lifestyle choices, including:  Daily care of your teeth and gums.  Regular physical activity.  Eating a healthy diet.  Avoiding tobacco and drug use.  Limiting alcohol use.  Practicing safe sex.  Taking low doses of aspirin every day.  Taking vitamin and mineral supplements as recommended by your health care provider. What happens during an annual well check? The services and screenings done by your health care provider during your annual well check will depend on your age, overall health, lifestyle risk factors, and family history of disease. Counseling  Your health  care provider may ask you questions about your:  Alcohol use.  Tobacco use.  Drug use.  Emotional well-being.  Home and relationship well-being.  Sexual activity.  Eating habits.  History of falls.  Memory and ability to understand (cognition).  Work and work Statistician. Screening  You may have the following tests or measurements:  Height, weight, and BMI.  Blood pressure.  Lipid and cholesterol levels. These may be checked every 5 years, or more frequently if you are over 67 years old.  Skin check.  Lung cancer screening. You may have this screening every year starting at age 93 if you have a 30-pack-year history of smoking and currently smoke or have quit within the past 15 years.  Fecal occult blood test (FOBT) of the stool. You may have this test every year starting at age 79.  Flexible sigmoidoscopy or colonoscopy. You may have a sigmoidoscopy every 5 years or a colonoscopy every 10 years starting at age 38.  Prostate cancer screening. Recommendations will vary depending on your family history and other risks.  Hepatitis C blood test.  Hepatitis B blood test.  Sexually transmitted disease (STD) testing.  Diabetes screening. This is done by checking your blood sugar (glucose) after you have not eaten for a while (fasting). You may have this done every 1-3 years.  Abdominal aortic aneurysm (AAA) screening. You may need this if you are a current or former smoker.  Osteoporosis. You may be screened starting at age 58 if you are at high risk. Talk with your health care provider about your test results, treatment options, and if necessary, the need for more tests. Vaccines  Your health care provider may recommend certain vaccines, such as:  Influenza vaccine. This is recommended every year.  Tetanus, diphtheria, and acellular pertussis (Tdap, Td) vaccine. You may need a Td booster every 10 years.  Zoster vaccine. You may need this after age  74.  Pneumococcal 13-valent conjugate (PCV13) vaccine. One dose is recommended after age 19.  Pneumococcal polysaccharide (PPSV23) vaccine. One dose is recommended after age 2. Talk to your health care provider about which screenings and vaccines you need and how often you need them. This information is not intended to replace advice given to you by your health care provider. Make sure you discuss any questions you have with your health care provider. Document Released: 07/26/2015 Document Revised: 03/18/2016 Document Reviewed: 04/30/2015 Elsevier Interactive Patient Education  2017 [employer] Prevention in the Home Falls can cause injuries. They can happen to people of all ages. There are many things you can do to make your home safe and to help prevent falls. What can I do on the outside of my home?  Regularly fix the edges of walkways and driveways and fix any cracks.  Remove anything that might make you trip as you walk through a door, such as a raised step or threshold.  Trim any bushes or trees on the path to your home.  Use bright outdoor lighting.  Clear any walking paths of anything that might make someone trip, such as rocks or tools.  Regularly check to see if handrails are loose or broken. Make sure that both sides of any steps have handrails.  Any raised decks and porches should have guardrails on the edges.  Have any leaves, snow, or ice cleared regularly.  Use sand or salt on walking paths during winter.  Clean up any spills in your garage right away. This includes oil or grease spills. What can I do in the bathroom?  Use night lights.  Install grab bars by the toilet and in the tub and shower. Do not use towel bars as grab bars.  Use non-skid mats or decals in the tub or shower.  If you need to sit down in the shower, use a plastic, non-slip stool.  Keep the floor dry. Clean up any water that spills on the floor as soon as it happens.  Remove  soap buildup in the tub or shower regularly.  Attach bath mats securely with double-sided non-slip rug tape.  Do not have throw rugs and other things on the floor that can make you trip. What can I do in the bedroom?  Use night lights.  Make sure that you have a light by your bed that is easy to reach.  Do not use any sheets or blankets that are too big for your bed. They should not hang down onto the floor.  Have a firm chair that has side arms. You can use this for support while you get dressed.  Do not have throw rugs and other things on the floor that can make you trip. What can I do in the kitchen?  Clean up any spills right away.  Avoid walking on wet floors.  Keep items that you use a lot in easy-to-reach places.  If you need to reach something above you, use a strong step stool that has a grab bar.  Keep electrical cords out of the way.  Do not use floor polish or wax that makes floors slippery. If you must use wax, use non-skid floor wax.  Do  not have throw rugs and other things on the floor that can make you trip. What can I do with my stairs?  Do not leave any items on the stairs.  Make sure that there are handrails on both sides of the stairs and use them. Fix handrails that are broken or loose. Make sure that handrails are as long as the stairways.  Check any carpeting to make sure that it is firmly attached to the stairs. Fix any carpet that is loose or worn.  Avoid having throw rugs at the top or bottom of the stairs. If you do have throw rugs, attach them to the floor with carpet tape.  Make sure that you have a light switch at the top of the stairs and the bottom of the stairs. If you do not have them, ask someone to add them for you. What else can I do to help prevent falls?  Wear shoes that:  Do not have high heels.  Have rubber bottoms.  Are comfortable and fit you well.  Are closed at the toe. Do not wear sandals.  If you use a  stepladder:  Make sure that it is fully opened. Do not climb a closed stepladder.  Make sure that both sides of the stepladder are locked into place.  Ask someone to hold it for you, if possible.  Clearly mark and make sure that you can see:  Any grab bars or handrails.  First and last steps.  Where the edge of each step is.  Use tools that help you move around (mobility aids) if they are needed. These include:  Canes.  Walkers.  Scooters.  Crutches.  Turn on the lights when you go into a dark area. Replace any light bulbs as soon as they burn out.  Set up your furniture so you have a clear path. Avoid moving your furniture around.  If any of your floors are uneven, fix them.  If there are any pets around you, be aware of where they are.  Review your medicines with your doctor. Some medicines can make you feel dizzy. This can increase your chance of falling. Ask your doctor what other things that you can do to help prevent falls. This information is not intended to replace advice given to you by your health care provider. Make sure you discuss any questions you have with your health care provider. Document Released: 04/25/2009 Document Revised: 12/05/2015 Document Reviewed: 08/03/2014 Elsevier Interactive Patient Education  2017 [employer].

## 2020-10-10 ENCOUNTER — Other Ambulatory Visit: Payer: Self-pay | Admitting: Family Medicine

## 2020-10-10 NOTE — Telephone Encounter (Signed)
He got #90 with 2 refills on 08-07-20 so should still have one refill left.

## 2020-10-10 NOTE — Telephone Encounter (Signed)
Last office visit- 07/16/2020 Last refill- 08/07/2020  No future office visit has been scheduled

## 2020-11-08 ENCOUNTER — Other Ambulatory Visit: Payer: Self-pay | Admitting: Family Medicine

## 2020-11-11 NOTE — Telephone Encounter (Signed)
Last filled 08/07/2020 Last OV 07/16/2020  Ok to fill?

## 2020-12-02 ENCOUNTER — Other Ambulatory Visit: Payer: Self-pay | Admitting: Family Medicine

## 2020-12-23 ENCOUNTER — Other Ambulatory Visit: Payer: Self-pay | Admitting: Family Medicine

## 2020-12-31 ENCOUNTER — Telehealth: Payer: Self-pay | Admitting: Family Medicine

## 2020-12-31 MED ORDER — NITROGLYCERIN 0.4 MG SL SUBL: SUBLINGUAL_TABLET | SUBLINGUAL | 0 refills | Status: DC

## 2020-12-31 NOTE — Telephone Encounter (Signed)
Please advise. Pt does not currently have a cardiologist and would like to know if you could prescribe this until he finds a new one.

## 2020-12-31 NOTE — Telephone Encounter (Signed)
Rx has been sent in. Nothing further needed at this time.

## 2020-12-31 NOTE — Telephone Encounter (Signed)
Patient's pharmacy called stating that they sent in a prescription request for nitroglycerin that was originally prescribed by his cardiologist.   Please advise.

## 2021-01-01 ENCOUNTER — Other Ambulatory Visit: Payer: Self-pay | Admitting: *Deleted

## 2021-01-01 ENCOUNTER — Other Ambulatory Visit: Payer: Self-pay

## 2021-01-01 ENCOUNTER — Encounter: Payer: Self-pay | Admitting: Family Medicine

## 2021-01-01 ENCOUNTER — Ambulatory Visit (INDEPENDENT_AMBULATORY_CARE_PROVIDER_SITE_OTHER): Payer: Medicare Other | Admitting: Family Medicine

## 2021-01-01 VITALS — BP 130/60 | HR 75 | Temp 98.1°F | Wt 196.1 lb

## 2021-01-01 DIAGNOSIS — I10 Essential (primary) hypertension: Secondary | ICD-10-CM | POA: Diagnosis not present

## 2021-01-01 DIAGNOSIS — Z8679 Personal history of other diseases of the circulatory system: Secondary | ICD-10-CM | POA: Diagnosis not present

## 2021-01-01 DIAGNOSIS — I359 Nonrheumatic aortic valve disorder, unspecified: Secondary | ICD-10-CM

## 2021-01-01 DIAGNOSIS — F411 Generalized anxiety disorder: Secondary | ICD-10-CM | POA: Diagnosis not present

## 2021-01-01 MED ORDER — ELIQUIS 5 MG PO TABS: 1.0000 | ORAL_TABLET | Freq: Two times a day (BID) | ORAL | 0 refills | Status: DC

## 2021-01-01 NOTE — Progress Notes (Signed)
Established Patient Office Visit  Subjective:  Patient ID: Randall Lara, male    DOB: 10/01/1929  Age: 87 y.o. MRN: [id number]  CC:  Chief Complaint  Patient presents with   Follow-up    Medication follow up    HPI Randall Lara presents for medical follow-up.  He has history of atrial fibrillation, recurrent depression, hypertension, chronic anxiety.  His wife has dementia and has been declining physically.  He states she is currently in the hospital and may be going to nursing home at least short-term for some rehab.  He has been very busy caring for her over the past few years.  He remains on Eliquis for atrial fibrillation.  He takes benazepril HCTZ for hypertension.  Compliant with medications.  No recent chest pains or dizziness.  He has recurrent depression and feels like he is stable on current regimen of Wellbutrin and mirtazapine.  Also low-dose Abilify 2 mg nightly.  This regimen seems to be working well for him.  He has been on alprazolam for over 30 years.  We have discussed multiple times trying to taper off in the past.  He is extremely reluctant to cut back.  We did in fact cut him back to lower dose years ago and he had severe anxiety symptoms.  He is aware of risk of fall and other potential risk with benzodiazepine use chronically.  Past Medical History:  Diagnosis Date   Anxiety    Arthritis    Atrial fibrillation (HCC)    Depression    Dysrhythmia    atrial fib   GERD (gastroesophageal reflux disease)    no meds needed- diet controlled   Heart palpitations    Hypertension    Joint pain    Obesity     Past Surgical History:  Procedure Laterality Date   CATARACT EXTRACTION Left    CATARACT EXTRACTION W/PHACO Right 03/05/2016   Procedure: CATARACT EXTRACTION PHACO AND INTRAOCULAR LENS PLACEMENT ; CDE:  11.47;  Surgeon: Tonny Branch, MD;  Location: [facility];  Service: Ophthalmology;  Laterality: Right;   ESOPHAGOGASTRODUODENOSCOPY  03/29/2002   [id number]  esophagus, stomach, and duodenum through the second  portion.   hernia repair Bilateral    Inguinal   TOTAL KNEE ARTHROPLASTY Right 11/13/2015   Procedure: TOTAL KNEE ARTHROPLASTY;  Surgeon: Latanya Maudlin, MD;  Location: [facility];  Service: Orthopedics;  Laterality: Right;    Family History  Problem Relation Age of Onset   Stroke Mother    Hypertension Mother    Stroke Father    Stroke Sister    Heart attack Brother    Hypertension Sister    Colon cancer Neg Hx     Social History   Socioeconomic History   Marital status: Married    Spouse name: Not on file   Number of children: Not on file   Years of education: Not on file   Highest education level: Not on file  Occupational History   Not on file  Tobacco Use   Smoking status: Former    Packs/day: 1.00    Years: 20.00    Pack years: 20.00    Types: Cigarettes    Start date: 08/07/1955    Quit date: 08/07/1975    Years since quitting: 45.4   Smokeless tobacco: Never  Vaping Use   Vaping Use: Never used  Substance and Sexual Activity   Alcohol use: Yes    Alcohol/week: 0.0 standard drinks    Comment: occassional  Drug use: No   Sexual activity: Yes    Birth control/protection: None  Other Topics Concern   Not on file  Social History Narrative   Not on file   Social Determinants of Health   Financial Resource Strain: Low Risk    Difficulty of Paying Living Expenses: Not hard at all  Food Insecurity: No Food Insecurity   Worried About Charity fundraiser in the Last Year: Never true   [place] in the Last Year: Never true  Transportation Needs: No Transportation Needs   Lack of Transportation (Medical): No   Lack of Transportation (Non-Medical): No  Physical Activity: Inactive   Days of Exercise per Week: 0 days   Minutes of Exercise per Session: 0 min  Stress: No Stress Concern Present   Feeling of Stress : Not at all  Social Connections: Moderately Isolated   Frequency of Communication with Friends  and Family: More than three times a week   Frequency of Social Gatherings with Friends and Family: Three times a week   Attends Religious Services: Never   Active Member of Clubs or Organizations: No   Attends Archivist Meetings: Never   Marital Status: Married  Human resources officer Violence: Not At Risk   Fear of Current or Ex-Partner: No   Emotionally Abused: No   Physically Abused: No   Sexually Abused: No    Outpatient Medications Prior to Visit  Medication Sig Dispense Refill   ALPRAZolam (XANAX) 1 MG tablet TAKE ONE TABLET BY MOUTH THREE TIMES DAILY 90 tablet 2   ARIPiprazole (ABILIFY) 2 MG tablet TAKE ONE TABLET BY MOUTH DAILY 90 tablet 2   benazepril-hydrochlorthiazide (LOTENSIN HCT) 20-12.5 MG tablet TAKE ONE TABLET BY MOUTH DAILY 90 tablet 0   buPROPion (WELLBUTRIN XL) 300 MG 24 hr tablet TAKE ONE TABLET BY MOUTH DAILY 90 tablet 1   ELIQUIS 5 MG TABS tablet TAKE ONE TABLET BY MOUTH TWICE DAILY 180 tablet 2   mirtazapine (REMERON SOL-TAB) 30 MG disintegrating tablet DISSOLVE ONE TABLET IN MOUTH AT BEDTIME. **Needs Dr Appt** 90 tablet 0   nitroGLYCERIN (NITROSTAT) 0.4 MG SL tablet DISSOLVE ONE TABLET UNDER TONGUE EVERY 5 MINUTES UP TO 3 DOSES AS NEEDED FOR CHEST PAIN. DO NOT EXCEED A TOTAL OF 3 DOSES IN 15 MINUTES. 25 tablet 0   nystatin-triamcinolone ointment (MYCOLOG) Apply 1 application topically 2 (two) times daily. 30 g 3   No facility-administered medications prior to visit.    Allergies  Allergen Reactions   Ferrous Sulfate     REACTION: swelling, hives. Doesn't recall.   Sulfa Antibiotics Hives and Swelling    ROS Review of Systems  Constitutional:  Negative for chills and fever.  Respiratory:  Negative for shortness of breath.   Cardiovascular:  Negative for chest pain and palpitations.  Neurological:  Negative for dizziness and syncope.     Objective:    Physical Exam Vitals reviewed.  Cardiovascular:     Rate and Rhythm: Normal rate and regular  rhythm.     Heart sounds: Murmur heard.     Comments: He has a least 2/6 systolic murmur right upper sternal border Pulmonary:     Effort: Pulmonary effort is normal.     Breath sounds: Normal breath sounds.  Musculoskeletal:     Right lower leg: No edema.     Left lower leg: No edema.  Neurological:     Mental Status: He is alert.    BP 130/60 (BP Location:  Left Arm, Patient Position: Sitting, Cuff Size: Normal)   Pulse 75   Temp 98.1 F (36.7 C) (Oral)   Wt 196 lb 1.6 oz (89 kg)   SpO2 96%   BMI 26.60 kg/m  Wt Readings from Last 3 Encounters:  01/01/21 196 lb 1.6 oz (89 kg)  07/16/20 193 lb (87.5 kg)  08/02/19 191 lb 3.2 oz (86.7 kg)     Health Maintenance Due  Topic Date Due   Zoster Vaccines- Shingrix (1 of 2) Never done   PNA vac Low Risk Adult (2 of 2 - PPSV23) 05/16/2015   TETANUS/TDAP  11/12/2019   COVID-19 Vaccine (4 - Booster for Moderna series) 09/30/2020    There are no preventive care reminders to display for this patient.  Lab Results  Component Value Date   TSH 2.46 08/02/2019   Lab Results  Component Value Date   WBC 5.6 07/16/2020   HGB 13.5 07/16/2020   HCT 40.3 07/16/2020   MCV 95.2 07/16/2020   PLT 132.0 (L) 07/16/2020   Lab Results  Component Value Date   NA 141 07/16/2020   K 4.4 07/16/2020   CO2 32 07/16/2020   GLUCOSE 100 (H) 07/16/2020   BUN 19 07/16/2020   CREATININE 1.32 07/16/2020   BILITOT 0.7 08/02/2019   ALKPHOS 75 08/02/2019   AST 27 08/02/2019   ALT 18 08/02/2019   PROT 6.4 08/02/2019   ALBUMIN 3.9 08/02/2019   CALCIUM 9.8 07/16/2020   ANIONGAP 7 02/28/2016   GFR 48.03 (L) 07/16/2020   No results found for: CHOL No results found for: HDL No results found for: LDLCALC No results found for: TRIG No results found for: [facility] Lab Results  Component Value Date   HGBA1C 5.7 07/16/2020      Assessment & Plan:   #1 chronic atrial fibrillation.  Appears to be in sinus rhythm today. -Continue Eliquis 5 mg twice  daily -He had CBC and basic metabolic panel few months ago.  These need to be checked at least yearly  #2 hypertension stable -Continue benazepril HCTZ at current dose  #3 history of recurrent depression currently stable -Continue mirtazapine and Wellbutrin  #4 aortic murmur right upper sternal border.  Patient likely has some aortic sclerosis.  Denies any dizziness, syncope, or progressive dyspnea  #5 hx of chronic anxiety.  We had tried a couple of SSRIs in past but they did not help.   We have had MANY discussions with him over the years re: risks of benzos with fall risk and cognitive concerns.   We have tried tapering a few times without success.      Follow-up: Return in about 6 months (around 07/03/2021).    Carolann Littler, MD

## 2021-01-09 ENCOUNTER — Other Ambulatory Visit: Payer: Self-pay | Admitting: Family Medicine

## 2021-01-09 NOTE — Telephone Encounter (Signed)
Last refill- 11/11/20-90 tabs,2 refills Last office visit- 01/01/21  No future office visit scheduled

## 2021-01-10 NOTE — Telephone Encounter (Signed)
Called pharmacy patient picked up refill today, next refill due on 02/11/21.  Thanks

## 2021-02-10 ENCOUNTER — Other Ambulatory Visit: Payer: Self-pay | Admitting: Family Medicine

## 2021-02-10 NOTE — Telephone Encounter (Signed)
Last filled 11/11/2020 Last OV 01/01/2021  Ok to fill?

## 2021-03-12 ENCOUNTER — Other Ambulatory Visit: Payer: Self-pay | Admitting: Family Medicine

## 2021-05-07 ENCOUNTER — Telehealth: Payer: Self-pay | Admitting: Family Medicine

## 2021-05-07 NOTE — Telephone Encounter (Signed)
Please advise 

## 2021-05-07 NOTE — Telephone Encounter (Signed)
Pt is calling and needs to have a tooth extraction and would like to know how many days should he stop blood thinner

## 2021-05-08 ENCOUNTER — Other Ambulatory Visit: Payer: Self-pay | Admitting: Family Medicine

## 2021-05-08 NOTE — Telephone Encounter (Signed)
ATC, unable to leave a message.  

## 2021-05-08 NOTE — Telephone Encounter (Signed)
Spoke with the patient. He is aware of Dr. Erick Blinks message.

## 2021-05-09 NOTE — Telephone Encounter (Signed)
Last filled 02/10/2021 Last OV 01/01/2021  Ok to fill?

## 2021-05-27 ENCOUNTER — Ambulatory Visit (INDEPENDENT_AMBULATORY_CARE_PROVIDER_SITE_OTHER): Payer: Medicare Other | Admitting: Family Medicine

## 2021-05-27 VITALS — BP 130/68 | HR 58 | Temp 98.6°F | Wt 196.0 lb

## 2021-05-27 DIAGNOSIS — Z8679 Personal history of other diseases of the circulatory system: Secondary | ICD-10-CM | POA: Diagnosis not present

## 2021-05-27 DIAGNOSIS — R7303 Prediabetes: Secondary | ICD-10-CM

## 2021-05-27 DIAGNOSIS — I10 Essential (primary) hypertension: Secondary | ICD-10-CM

## 2021-05-27 DIAGNOSIS — Z79899 Other long term (current) drug therapy: Secondary | ICD-10-CM

## 2021-05-27 LAB — BASIC METABOLIC PANEL
BUN: 26 mg/dL — ABNORMAL HIGH (ref 6–23)
CO2: 32 mEq/L (ref 19–32)
Calcium: 9.7 mg/dL (ref 8.4–10.5)
Chloride: 102 mEq/L (ref 96–112)
Creatinine, Ser: 1.27 mg/dL (ref 0.40–1.50)
GFR: 50 mL/min — ABNORMAL LOW (ref >60.00)
Glucose, Bld: 85 mg/dL (ref 70–99)
Potassium: 4 mEq/L (ref 3.5–5.1)
Sodium: 140 mEq/L (ref 135–145)

## 2021-05-27 LAB — CBC WITH DIFFERENTIAL/PLATELET
Basophils Absolute: 0 10*3/uL (ref 0.0–0.1)
Basophils Relative: 0.5 % (ref 0.0–3.0)
Eosinophils Absolute: 0.1 10*3/uL (ref 0.0–0.7)
Eosinophils Relative: 1.3 % (ref 0.0–5.0)
HCT: 38.8 % — ABNORMAL LOW (ref 39.0–52.0)
Hemoglobin: 13 g/dL (ref 13.0–17.0)
Lymphocytes Relative: 29.3 % (ref 12.0–46.0)
Lymphs Abs: 1.3 10*3/uL (ref 0.7–4.0)
MCHC: 33.5 g/dL (ref 30.0–36.0)
MCV: 95.8 fl (ref 78.0–100.0)
Monocytes Absolute: 0.6 10*3/uL (ref 0.1–1.0)
Monocytes Relative: 12.3 % — ABNORMAL HIGH (ref 3.0–12.0)
Neutro Abs: 2.6 10*3/uL (ref 1.4–7.7)
Neutrophils Relative %: 56.6 % (ref 43.0–77.0)
Platelets: 110 10*3/uL — ABNORMAL LOW (ref 150.0–400.0)
RBC: 4.05 Mil/uL — ABNORMAL LOW (ref 4.22–5.81)
RDW: 14 % (ref 11.5–15.5)
WBC: 4.5 10*3/uL (ref 4.0–10.5)

## 2021-05-27 LAB — HEMOGLOBIN A1C: Hgb A1c MFr Bld: 5.4 % (ref 4.6–6.5)

## 2021-05-27 MED ORDER — BUPROPION HCL ER (XL) 300 MG PO TB24: 300.0000 mg | ORAL_TABLET | Freq: Every day | ORAL | 3 refills | Status: DC

## 2021-05-27 MED ORDER — BENAZEPRIL-HYDROCHLOROTHIAZIDE 20-12.5 MG PO TABS
1.0000 | ORAL_TABLET | Freq: Every day | ORAL | 3 refills | Status: DC
Start: 2021-05-27 — End: 2022-03-12

## 2021-05-27 MED ORDER — ARIPIPRAZOLE 2 MG PO TABS: 2.0000 mg | ORAL_TABLET | Freq: Every day | ORAL | 3 refills | Status: DC

## 2021-05-27 MED ORDER — APIXABAN 5 MG PO TABS: 5.0000 mg | ORAL_TABLET | Freq: Two times a day (BID) | ORAL | 3 refills | Status: DC

## 2021-05-27 MED ORDER — APIXABAN 5 MG PO TABS: 5.0000 mg | ORAL_TABLET | Freq: Two times a day (BID) | ORAL | 0 refills | Status: DC

## 2021-05-27 MED ORDER — MIRTAZAPINE 30 MG PO TBDP: ORAL_TABLET | ORAL | 3 refills | Status: DC

## 2021-05-27 NOTE — Progress Notes (Signed)
Established Patient Office Visit  Subjective:  Patient ID: Randall Lara, male    DOB: 05/21/1930  Age: 87 y.o. MRN: [id number]  CC:  Chief Complaint  Patient presents with   Follow-up    Yearly visit for refills    HPI Randall Lara presents for medical follow-up.  He has history of hypertension, prediabetes, atrial fibrillation, recurrent depression, severe chronic anxiety.  His wife has advanced dementia and this has been very burdensome on him.  Patient also does not drive much except for locally and he has someone that brought him today.  In reviewing his medications he needs several things refilled including Eliquis, Lotensin HCTZ, Abilify, Wellbutrin, and Remeron.  He does take Xanax 3 times a day.  We have tried many times over the years to get him off this but he has had extreme anxiety symptoms.  He has not responded well to SSRIs in the past.  His balance is good.  Denies any recent falls.  Denies any recent bleeding complications.  He feels like his current combination of medications is working well for his anxiety and depression.  Still has some breakthrough anxiety at times.  Does have aortic murmur and denies any significant dizziness or dyspnea with exertion.  No recent syncope.  No chest pains.  Past Medical History:  Diagnosis Date   Anxiety    Arthritis    Atrial fibrillation (HCC)    Depression    Dysrhythmia    atrial fib   GERD (gastroesophageal reflux disease)    no meds needed- diet controlled   Heart palpitations    Hypertension    Joint pain    Obesity     Past Surgical History:  Procedure Laterality Date   CATARACT EXTRACTION Left    CATARACT EXTRACTION W/PHACO Right 03/05/2016   Procedure: CATARACT EXTRACTION PHACO AND INTRAOCULAR LENS PLACEMENT ; CDE:  11.47;  Surgeon: Tonny Branch, MD;  Location: [facility];  Service: Ophthalmology;  Laterality: Right;   ESOPHAGOGASTRODUODENOSCOPY  03/29/2002   [id number] esophagus, stomach, and duodenum through the  second  portion.   hernia repair Bilateral    Inguinal   TOTAL KNEE ARTHROPLASTY Right 11/13/2015   Procedure: TOTAL KNEE ARTHROPLASTY;  Surgeon: Latanya Maudlin, MD;  Location: [facility];  Service: Orthopedics;  Laterality: Right;    Family History  Problem Relation Age of Onset   Stroke Mother    Hypertension Mother    Stroke Father    Stroke Sister    Heart attack Brother    Hypertension Sister    Colon cancer Neg Hx     Social History   Socioeconomic History   Marital status: Married    Spouse name: Not on file   Number of children: Not on file   Years of education: Not on file   Highest education level: Not on file  Occupational History   Not on file  Tobacco Use   Smoking status: Former    Packs/day: 1.00    Years: 20.00    Pack years: 20.00    Types: Cigarettes    Start date: 08/07/1955    Quit date: 08/07/1975    Years since quitting: 45.8   Smokeless tobacco: Never  Vaping Use   Vaping Use: Never used  Substance and Sexual Activity   Alcohol use: Yes    Alcohol/week: 0.0 standard drinks    Comment: occassional   Drug use: No   Sexual activity: Yes    Birth control/protection: None  Other  Topics Concern   Not on file  Social History Narrative   Not on file   Social Determinants of Health   Financial Resource Strain: Low Risk    Difficulty of Paying Living Expenses: Not hard at all  Food Insecurity: No Food Insecurity   Worried About Charity fundraiser in the Last Year: Never true   [place] in the Last Year: Never true  Transportation Needs: No Transportation Needs   Lack of Transportation (Medical): No   Lack of Transportation (Non-Medical): No  Physical Activity: Inactive   Days of Exercise per Week: 0 days   Minutes of Exercise per Session: 0 min  Stress: No Stress Concern Present   Feeling of Stress : Not at all  Social Connections: Moderately Isolated   Frequency of Communication with Friends and Family: More than three times a week    Frequency of Social Gatherings with Friends and Family: Three times a week   Attends Religious Services: Never   Active Member of Clubs or Organizations: No   Attends Archivist Meetings: Never   Marital Status: Married  Human resources officer Violence: Not At Risk   Fear of Current or Ex-Partner: No   Emotionally Abused: No   Physically Abused: No   Sexually Abused: No    Outpatient Medications Prior to Visit  Medication Sig Dispense Refill   ALPRAZolam (XANAX) 1 MG tablet TAKE ONE TABLET BY MOUTH THREE TIMES DAILY 90 tablet 2   nitroGLYCERIN (NITROSTAT) 0.4 MG SL tablet DISSOLVE ONE TABLET UNDER TONGUE EVERY 5 MINUTES UP TO 3 DOSES AS NEEDED FOR CHEST PAIN. DO NOT EXCEED A TOTAL OF 3 DOSES IN 15 MINUTES. 25 tablet 0   nystatin-triamcinolone ointment (MYCOLOG) Apply 1 application topically 2 (two) times daily. 30 g 3   apixaban (ELIQUIS) 5 MG TABS tablet Take 1 tablet (5 mg total) by mouth 2 (two) times daily. 42 tablet 0   ARIPiprazole (ABILIFY) 2 MG tablet TAKE ONE TABLET BY MOUTH DAILY 90 tablet 2   benazepril-hydrochlorthiazide (LOTENSIN HCT) 20-12.5 MG tablet TAKE ONE TABLET BY MOUTH DAILY 90 tablet 0   buPROPion (WELLBUTRIN XL) 300 MG 24 hr tablet TAKE ONE TABLET BY MOUTH DAILY 90 tablet 1   mirtazapine (REMERON SOL-TAB) 30 MG disintegrating tablet DISSOLVE ONE TABLET BY MOUTH AT BEDTIME **NEED A DOCTORS APPOINTMENT** 90 tablet 0   No facility-administered medications prior to visit.    Allergies  Allergen Reactions   Ferrous Sulfate     REACTION: swelling, hives. Doesn't recall.   Sulfa Antibiotics Hives and Swelling    ROS Review of Systems  Constitutional:  Negative for fatigue.  Eyes:  Negative for visual disturbance.  Respiratory:  Negative for cough, chest tightness and shortness of breath.   Cardiovascular:  Negative for chest pain, palpitations and leg swelling.  Endocrine: Negative for polydipsia and polyuria.  Neurological:  Negative for dizziness,  syncope, weakness, light-headedness and headaches.  Psychiatric/Behavioral:  Negative for suicidal ideas. The patient is nervous/anxious.      Objective:    Physical Exam Constitutional:      Appearance: He is well-developed.  HENT:     Right Ear: External ear normal.     Left Ear: External ear normal.  Eyes:     Pupils: Pupils are equal, round, and reactive to light.  Neck:     Thyroid: No thyromegaly.  Cardiovascular:     Rate and Rhythm: Normal rate.     Heart sounds: Murmur heard.  Comments: Irregular rhythm consistent with his atrial fibrillation.  He has 3/6 systolic murmur right upper sternal border Pulmonary:     Effort: Pulmonary effort is normal. No respiratory distress.     Breath sounds: Normal breath sounds. No wheezing or rales.  Musculoskeletal:     Cervical back: Neck supple.     Right lower leg: No edema.     Left lower leg: No edema.  Neurological:     Mental Status: He is alert and oriented to person, place, and time.    BP 130/68 (BP Location: Left Arm, Patient Position: Sitting, Cuff Size: Normal)   Pulse (!) 58   Temp 98.6 F (37 C) (Oral)   Wt 196 lb (88.9 kg)   SpO2 97%   BMI 26.58 kg/m  Wt Readings from Last 3 Encounters:  05/27/21 196 lb (88.9 kg)  01/01/21 196 lb 1.6 oz (89 kg)  07/16/20 193 lb (87.5 kg)     Health Maintenance Due  Topic Date Due   Zoster Vaccines- Shingrix (1 of 2) Never done   Pneumonia Vaccine 14+ Years old (2 - PPSV23 if available, else PCV20) 05/16/2015   TETANUS/TDAP  11/12/2019   COVID-19 Vaccine (4 - Booster for Moderna series) 08/27/2020    There are no preventive care reminders to display for this patient.  Lab Results  Component Value Date   TSH 2.46 08/02/2019   Lab Results  Component Value Date   WBC 5.6 07/16/2020   HGB 13.5 07/16/2020   HCT 40.3 07/16/2020   MCV 95.2 07/16/2020   PLT 132.0 (L) 07/16/2020   Lab Results  Component Value Date   NA 141 07/16/2020   K 4.4 07/16/2020   CO2  32 07/16/2020   GLUCOSE 100 (H) 07/16/2020   BUN 19 07/16/2020   CREATININE 1.32 07/16/2020   BILITOT 0.7 08/02/2019   ALKPHOS 75 08/02/2019   AST 27 08/02/2019   ALT 18 08/02/2019   PROT 6.4 08/02/2019   ALBUMIN 3.9 08/02/2019   CALCIUM 9.8 07/16/2020   ANIONGAP 7 02/28/2016   GFR 48.03 (L) 07/16/2020   No results found for: CHOL No results found for: HDL No results found for: LDLCALC No results found for: TRIG No results found for: CHOLHDL Lab Results  Component Value Date   HGBA1C 5.7 07/16/2020      Assessment & Plan:   #1 hypertension stable and at goal.  Refill Lotensin HCTZ.  Recheck basic metabolic panel  #2 history of atrial fibrillation.  Appears to be in A. fib today.  Refill Eliquis.  Rate controlled.  Check CBC and BMP  #3 history of recurrent depression.  Currently stable.  Refill Wellbutrin and Remeron.  #4 history of prediabetes range blood sugars.  Reassess A1c  Flu vaccine already given.  We will plan 1 year follow-up unless indicated sooner   Meds ordered this encounter  Medications   mirtazapine (REMERON SOL-TAB) 30 MG disintegrating tablet    Sig: DISSOLVE ONE TABLET BY MOUTH AT BEDTIME    Dispense:  90 tablet    Refill:  3   apixaban (ELIQUIS) 5 MG TABS tablet    Sig: Take 1 tablet (5 mg total) by mouth 2 (two) times daily.    Dispense:  180 tablet    Refill:  3   buPROPion (WELLBUTRIN XL) 300 MG 24 hr tablet    Sig: Take 1 tablet (300 mg total) by mouth daily.    Dispense:  90 tablet    Refill:  3    This prescription was filled on 03/12/2021. Any refills authorized will be placed on file.   benazepril-hydrochlorthiazide (LOTENSIN HCT) 20-12.5 MG tablet    Sig: Take 1 tablet by mouth daily.    Dispense:  90 tablet    Refill:  3   ARIPiprazole (ABILIFY) 2 MG tablet    Sig: Take 1 tablet (2 mg total) by mouth daily.    Dispense:  90 tablet    Refill:  3    This prescription was filled on 08/10/2020. Any refills authorized will be placed  on file.    Follow-up: Return in about 6 months (around 11/24/2021).    Carolann Littler, MD

## 2021-05-27 NOTE — Addendum Note (Signed)
Addended by: Westley Hummer B on: 05/27/2021 05:04 PM   Modules accepted: Orders

## 2021-05-27 NOTE — Addendum Note (Signed)
Addended by: Rosalyn Gess D on: 05/27/2021 01:39 PM   Modules accepted: Orders

## 2021-07-14 ENCOUNTER — Other Ambulatory Visit: Payer: Self-pay | Admitting: Family Medicine

## 2021-08-12 ENCOUNTER — Other Ambulatory Visit: Payer: Self-pay | Admitting: Family Medicine

## 2021-08-12 NOTE — Telephone Encounter (Signed)
Last filled 05/09/2021 Last OV 05/27/2021  Ok to refill?

## 2021-08-27 ENCOUNTER — Telehealth: Payer: Self-pay | Admitting: Family Medicine

## 2021-08-27 NOTE — Telephone Encounter (Signed)
I spoke with patient to schedule Medicare Annual Wellness Visit (AWV) either virtually or in office.   Patient declined stating he only wants to see Dr Elease Hashimoto   Last AWV 10/02/20  please schedule at anytime with [facility] [place] 1 or 2   This should be a 45 minute visit.

## 2021-08-31 DIAGNOSIS — I499 Cardiac arrhythmia, unspecified: Secondary | ICD-10-CM | POA: Diagnosis not present

## 2021-08-31 DIAGNOSIS — I700 Atherosclerosis of aorta: Secondary | ICD-10-CM | POA: Diagnosis not present

## 2021-08-31 DIAGNOSIS — R531 Weakness: Secondary | ICD-10-CM | POA: Diagnosis not present

## 2021-08-31 DIAGNOSIS — J90 Pleural effusion, not elsewhere classified: Secondary | ICD-10-CM | POA: Diagnosis not present

## 2021-08-31 DIAGNOSIS — R933 Abnormal findings on diagnostic imaging of other parts of digestive tract: Secondary | ICD-10-CM | POA: Diagnosis not present

## 2021-08-31 DIAGNOSIS — R42 Dizziness and giddiness: Secondary | ICD-10-CM | POA: Diagnosis not present

## 2021-08-31 DIAGNOSIS — I509 Heart failure, unspecified: Secondary | ICD-10-CM | POA: Diagnosis not present

## 2021-08-31 DIAGNOSIS — Z743 Need for continuous supervision: Secondary | ICD-10-CM | POA: Diagnosis not present

## 2021-08-31 DIAGNOSIS — Z5329 Procedure and treatment not carried out because of patient's decision for other reasons: Secondary | ICD-10-CM | POA: Diagnosis not present

## 2021-08-31 DIAGNOSIS — K5900 Constipation, unspecified: Secondary | ICD-10-CM | POA: Diagnosis not present

## 2021-08-31 DIAGNOSIS — R6889 Other general symptoms and signs: Secondary | ICD-10-CM | POA: Diagnosis not present

## 2021-08-31 DIAGNOSIS — R079 Chest pain, unspecified: Secondary | ICD-10-CM | POA: Diagnosis not present

## 2021-08-31 DIAGNOSIS — I6381 Other cerebral infarction due to occlusion or stenosis of small artery: Secondary | ICD-10-CM | POA: Diagnosis not present

## 2021-08-31 DIAGNOSIS — Z7901 Long term (current) use of anticoagulants: Secondary | ICD-10-CM | POA: Diagnosis not present

## 2021-08-31 DIAGNOSIS — I517 Cardiomegaly: Secondary | ICD-10-CM | POA: Diagnosis not present

## 2021-08-31 DIAGNOSIS — I4891 Unspecified atrial fibrillation: Secondary | ICD-10-CM | POA: Diagnosis not present

## 2021-08-31 DIAGNOSIS — R55 Syncope and collapse: Secondary | ICD-10-CM | POA: Diagnosis not present

## 2021-09-01 ENCOUNTER — Telehealth: Payer: Self-pay | Admitting: Internal Medicine

## 2021-09-01 ENCOUNTER — Ambulatory Visit (INDEPENDENT_AMBULATORY_CARE_PROVIDER_SITE_OTHER): Payer: Medicare Other

## 2021-09-01 ENCOUNTER — Ambulatory Visit: Payer: Medicare Other | Admitting: Physician Assistant

## 2021-09-01 ENCOUNTER — Other Ambulatory Visit: Payer: Self-pay | Admitting: Physician Assistant

## 2021-09-01 ENCOUNTER — Encounter: Payer: Self-pay | Admitting: Physician Assistant

## 2021-09-01 VITALS — BP 124/72 | HR 53 | Ht 73.0 in | Wt 203.8 lb

## 2021-09-01 DIAGNOSIS — I350 Nonrheumatic aortic (valve) stenosis: Secondary | ICD-10-CM

## 2021-09-01 DIAGNOSIS — I5031 Acute diastolic (congestive) heart failure: Secondary | ICD-10-CM

## 2021-09-01 DIAGNOSIS — R55 Syncope and collapse: Secondary | ICD-10-CM | POA: Diagnosis not present

## 2021-09-01 DIAGNOSIS — I4821 Permanent atrial fibrillation: Secondary | ICD-10-CM | POA: Diagnosis not present

## 2021-09-01 DIAGNOSIS — I4891 Unspecified atrial fibrillation: Secondary | ICD-10-CM | POA: Diagnosis not present

## 2021-09-01 DIAGNOSIS — I10 Essential (primary) hypertension: Secondary | ICD-10-CM | POA: Diagnosis not present

## 2021-09-01 MED ORDER — FUROSEMIDE 20 MG PO TABS: 20.0000 mg | ORAL_TABLET | ORAL | 0 refills | Status: DC

## 2021-09-01 NOTE — Telephone Encounter (Signed)
Patient was given OV for today in [place] office with Estella Husk, PA.

## 2021-09-01 NOTE — Progress Notes (Signed)
Cardiology Office Note    Date:  09/01/2021   ID:  [name], DOB 08/26/1929, MRN [id number]   PCP:  Randall Post, MD   [place]  Cardiologist:  Randall Sable, MD (Inactive)   Advanced Practice Provider:  No care team member to display Electrophysiologist:  None   [phone]   No chief complaint on file.   History of Present Illness:  Randall Lara is a 88 y.o. male with history of permanent Afib on eliquis, no AV nodal blocking agents, HTN, dementia, echo 2011 EF 65-70%, mild AI, low risk NST 06/2013.  Patient went to [facility] yesterday with near syncope, and advised admission for CHF but patient declined because of wife with severe dementia. Was not hypotensive and afib rate 60/m. CXR ? Free air in adbomen and CT abd -no free air. Felt to have CHF with elevated BNP-1143.   Patient comes in with his son. Had several episodes of dizziness and blurred vision while watching TV both Sat & Sunday evening.  Felt like he was going to pass out. Makes him nervous and takes a Xanax. Says HR has been around 47/m recently and heart feels like it stops when it happens. Denies chest pain, dyspnea. Son noticed feet swelling some recently. Eats frozen dinners/canned foods. Can't drive and struggles with early dementia as well.   Past Medical History:  Diagnosis Date   Anxiety    Arthritis    Atrial fibrillation (HCC)    Depression    Dysrhythmia    atrial fib   GERD (gastroesophageal reflux disease)    no meds needed- diet controlled   Heart palpitations    Hypertension    Joint pain    Obesity     Past Surgical History:  Procedure Laterality Date   CATARACT EXTRACTION Left    CATARACT EXTRACTION W/PHACO Right 03/05/2016   Procedure: CATARACT EXTRACTION PHACO AND INTRAOCULAR LENS PLACEMENT ; CDE:  11.47;  Surgeon: Randall Branch, MD;  Location: [facility];  Service: Ophthalmology;  Laterality: Right;   ESOPHAGOGASTRODUODENOSCOPY  03/29/2002    GLO:VFIEPP esophagus, stomach, and duodenum through the second  portion.   hernia repair Bilateral    Inguinal   TOTAL KNEE ARTHROPLASTY Right 11/13/2015   Procedure: TOTAL KNEE ARTHROPLASTY;  Surgeon: Randall Maudlin, MD;  Location: [facility];  Service: Orthopedics;  Laterality: Right;    Current Medications: Current Meds  Medication Sig   ALPRAZolam (XANAX) 1 MG tablet TAKE ONE TABLET BY MOUTH THREE TIMES DAILY (Patient taking differently: Take 1 mg by mouth as needed.)   ARIPiprazole (ABILIFY) 2 MG tablet Take 1 tablet (2 mg total) by mouth daily.   benazepril-hydrochlorthiazide (LOTENSIN HCT) 20-12.5 MG tablet Take 1 tablet by mouth daily.   buPROPion (WELLBUTRIN XL) 300 MG 24 hr tablet Take 1 tablet (300 mg total) by mouth daily.   ELIQUIS 5 MG TABS tablet TAKE ONE TABLET BY MOUTH TWICE DAILY   furosemide (LASIX) 20 MG tablet Take 1 tablet (20 mg total) by mouth as directed. X 3 days only, then only as advised by physician.   mirtazapine (REMERON SOL-TAB) 30 MG disintegrating tablet DISSOLVE ONE TABLET BY MOUTH AT BEDTIME   nitroGLYCERIN (NITROSTAT) 0.4 MG SL tablet DISSOLVE ONE TABLET UNDER TONGUE EVERY 5 MINUTES UP TO 3 DOSES AS NEEDED FOR CHEST PAIN. DO NOT EXCEED A TOTAL OF 3 DOSES IN 15 MINUTES.     Allergies:   Ferrous sulfate and Sulfa antibiotics  Social History   Socioeconomic History   Marital status: Married    Spouse name: Not on file   Number of children: Not on file   Years of education: Not on file   Highest education level: Not on file  Occupational History   Not on file  Tobacco Use   Smoking status: Former    Packs/day: 1.00    Years: 20.00    Pack years: 20.00    Types: Cigarettes    Start date: 08/07/1955    Quit date: 08/07/1975    Years since quitting: 46.1   Smokeless tobacco: Never  Vaping Use   Vaping Use: Never used  Substance and Sexual Activity   Alcohol use: Yes    Alcohol/week: 0.0 standard drinks    Comment: occassional   Drug use: No    Sexual activity: Yes    Birth control/protection: None  Other Topics Concern   Not on file  Social History Narrative   Not on file   Social Determinants of Health   Financial Resource Strain: Low Risk    Difficulty of Paying Living Expenses: Not hard at all  Food Insecurity: No Food Insecurity   Worried About Charity fundraiser in the Last Year: Never true   [place] in the Last Year: Never true  Transportation Needs: No Transportation Needs   Lack of Transportation (Medical): No   Lack of Transportation (Non-Medical): No  Physical Activity: Inactive   Days of Exercise per Week: 0 days   Minutes of Exercise per Session: 0 min  Stress: No Stress Concern Present   Feeling of Stress : Not at all  Social Connections: Moderately Isolated   Frequency of Communication with Friends and Family: More than three times a week   Frequency of Social Gatherings with Friends and Family: Three times a week   Attends Religious Services: Never   Active Member of Clubs or Organizations: No   Attends Archivist Meetings: Never   Marital Status: Married     Family History:  The patient's  family history includes Heart attack in his brother; Hypertension in his mother and sister; Stroke in his father, mother, and sister.   ROS:   Please see the history of present illness.    ROS All other systems reviewed and are negative.   PHYSICAL EXAM:   VS:  BP 124/72    Pulse (!) 53    Ht 6\' 1"  (1.854 m)    Wt 203 lb 12.8 oz (92.4 kg)    SpO2 95%    BMI 26.89 kg/m   Physical Exam  GEN: Well nourished, well developed, in no acute distress  Neck: no JVD, carotid bruits, or masses Cardiac:RRR;  4/6 harsh systolic murmur RSB/LSB  Respiratory:  clear to auscultation bilaterally, normal work of breathing GI: soft, nontender, nondistended, + BS Ext: bilateral edema, without cyanosis, clubbing,  Good distal pulses bilaterally Neuro:  Alert and Oriented x 3 Psych: euthymic mood, full  affect  Wt Readings from Last 3 Encounters:  09/01/21 203 lb 12.8 oz (92.4 kg)  05/27/21 196 lb (88.9 kg)  01/01/21 196 lb 1.6 oz (89 kg)      Studies/Labs Reviewed:   EKG:  EKG is not ordered today.   Recent Labs: 05/27/2021: BUN 26; Creatinine, Ser 1.27; Hemoglobin 13.0; Platelets 110.0; Potassium 4.0; Sodium 140   Lipid Panel No results found for: CHOL, TRIG, HDL, CHOLHDL, VLDL, LDLCALC, LDLDIRECT  Additional studies/ records that were reviewed  today include:  [facility]/31[facility]21 IMPRESSION: Changes consistent with CHF.  Lucency beneath the right hemidiaphragm suspicious for free air. CT of the abdomen and pelvis is recommended for further evaluation.  CT head [facility] 08/31/21 IMPRESSION: Chronic atrophic and ischemic changes without acute abnormality.  CT abd 08/31/21 IMPRESSION: 1. No free intra-abdominal air, particularly, no free air under the right hemidiaphragm. 2. Moderate colonic stool burden with colonic redundancy, suggesting constipation. 3. Cardiomegaly with trace right pleural effusion. 4. Nonspecific skin thickening overlying the bilateral ischia, recommend correlation with physical exam for decubitus ulcer.  Aortic Atherosclerosis (ICD10-I70.0).   Electronically Signed By: Keith Rake M.D. On: 09/01/2021 00:24   Risk Assessment/Calculations:    CHA2DS2-VASc Score = 5   This indicates a 7.2% annual risk of stroke. The patient's score is based upon: CHF History: 1 HTN History: 1 Diabetes History: 0 Stroke History: 0 Vascular Disease History: 1 Age Score: 2 Gender Score: 0        ASSESSMENT:    1. Permanent atrial fibrillation ([place])   2. Aortic valve stenosis, etiology of cardiac valve disease unspecified   3. Near syncope   4. Essential hypertension   5. Acute diastolic CHF (congestive heart failure), NYHA class 1 (HCC)      PLAN:  In order of problems listed above:  Permanent atrial fibrillation on Eliquis.  Has  not been on AV nodal agents for many years.  Patient noticed heart rates in the 40s for a while now.  Now with near syncope.  We will place 2-week monitor to rule out bradycardia arrhythmias  Near syncope occurred while sitting watching TV several times over the weekend.  Went to [facility] hospital EKG read as sinus bradycardia at 57 bpm, troponin negative CT of the head chest and abdomen unrevealing.  Possible CHF.  Patient declined admission. Will arrange transportation for f/u as his son works in [place], patient lives in [place]. Hospital records reviewed in detail. Advised to call EMS if any recurrent episodes.   Aortic stenosis murmur on exam sounds significant.  Will order 2D echo to be done this week  Diastolic CHF with leg edema, BNP > 1100, lungs clear, high sodium diet.  We will give low-dose Lasix 20 mg for 3 days he is already taken benazepril HCTZ.  2 g sodium diet  Shared Decision Making/Informed Consent        Medication Adjustments/Labs and Tests Ordered: Current medicines are reviewed at length with the patient today.  Concerns regarding medicines are outlined above.  Medication changes, Labs and Tests ordered today are listed in the Patient Instructions below. Patient Instructions  Medication Instructions:  Lasix 20mg  daily x 3 days only, then STOP Continue all other medications.     Labwork: none  Testing/Procedures: Your physician has requested that you have an echocardiogram. Echocardiography is a painless test that uses sound waves to create images of your heart. It provides your doctor with information about the size and shape of your heart and how well your hearts chambers and valves are working. This procedure takes approximately one hour. There are no restrictions for this procedure - will call patient back with transportation information.  Your physician has recommended that you wear a 14 day event monitor. Event monitors are medical devices that record the  hearts electrical activity. Doctors most often [place] these monitors to diagnose arrhythmias. Arrhythmias are problems with the speed or rhythm of the heartbeat. The monitor is a small, portable device. You can wear  one while you do your normal daily activities. This is usually used to diagnose what is causing palpitations/syncope (passing out). Office will contact with results via phone or letter.     Follow-Up: September 15, 2021 with Estella Husk, PA  Any Other Special Instructions Will Be Listed Below (If Applicable).   If you need a refill on your cardiac medications before your next appointment, please call your pharmacy.    Sumner Boast, PA-C  09/01/2021 1:33 PM    [facility] [place], [place], [place]  [address] Phone: [phone]; Fax: [phone]

## 2021-09-01 NOTE — Telephone Encounter (Signed)
° °  Pt's niece calling, pt was in the [facility] hospital and was told need to f/u with heart doctor in 24-48 hour, pt can only go to [facility]  office

## 2021-09-01 NOTE — Patient Instructions (Addendum)
Medication Instructions:  Lasix 20mg  daily x 3 days only, then STOP Continue all other medications.     Labwork: none  Testing/Procedures: Your physician has requested that you have an echocardiogram. Echocardiography is a painless test that uses sound waves to create images of your heart. It provides your doctor with information about the size and shape of your heart and how well your hearts chambers and valves are working. This procedure takes approximately one hour. There are no restrictions for this procedure - will call patient back with transportation information.  Your physician has recommended that you wear a 14 day event monitor. Event monitors are medical devices that record the hearts electrical activity. Doctors most often [place] these monitors to diagnose arrhythmias. Arrhythmias are problems with the speed or rhythm of the heartbeat. The monitor is a small, portable device. You can wear one while you do your normal daily activities. This is usually used to diagnose what is causing palpitations/syncope (passing out). Office will contact with results via phone or letter.     Follow-Up: September 15, 2021 with Estella Husk, PA  Any Other Special Instructions Will Be Listed Below (If Applicable).   If you need a refill on your cardiac medications before your next appointment, please call your pharmacy.

## 2021-09-03 ENCOUNTER — Ambulatory Visit (INDEPENDENT_AMBULATORY_CARE_PROVIDER_SITE_OTHER): Payer: Medicare Other

## 2021-09-03 DIAGNOSIS — I350 Nonrheumatic aortic (valve) stenosis: Secondary | ICD-10-CM | POA: Diagnosis not present

## 2021-09-03 LAB — ECHOCARDIOGRAM COMPLETE
AR max vel: 0.66 cm2
AV Area VTI: 0.81 cm2
AV Area mean vel: 0.7 cm2
AV Mean grad: 25.6 mmHg
AV Peak grad: 48.5 mmHg
Ao pk vel: 3.48 m/s
Area-P 1/2: 2.87 cm2
MV M vel: 4.86 m/s
MV Peak grad: 94.5 mmHg
S' Lateral: 2.87 cm

## 2021-09-08 ENCOUNTER — Other Ambulatory Visit: Payer: Self-pay

## 2021-09-08 ENCOUNTER — Ambulatory Visit: Payer: Medicare Other | Admitting: Internal Medicine

## 2021-09-08 ENCOUNTER — Encounter: Payer: Self-pay | Admitting: Internal Medicine

## 2021-09-08 VITALS — BP 126/76 | HR 60 | Ht 72.0 in | Wt 202.6 lb

## 2021-09-08 DIAGNOSIS — I350 Nonrheumatic aortic (valve) stenosis: Secondary | ICD-10-CM | POA: Diagnosis not present

## 2021-09-08 DIAGNOSIS — I493 Ventricular premature depolarization: Secondary | ICD-10-CM | POA: Diagnosis not present

## 2021-09-08 DIAGNOSIS — I4821 Permanent atrial fibrillation: Secondary | ICD-10-CM | POA: Diagnosis not present

## 2021-09-08 DIAGNOSIS — R55 Syncope and collapse: Secondary | ICD-10-CM | POA: Diagnosis not present

## 2021-09-08 NOTE — Progress Notes (Signed)
OFFICE NOTE  Chief Complaint:  Follow-up echo  Primary Care Physician: [name], MD  HPI:  Randall Lara is a 88 y.o. male with a past medial history significant for atrial fibrillation on Eliquis, HTN, depression and recent hospital visit for syncope.  He was seen at [facility] about a week ago with near syncope.  He was found to have an elevated BNP of 1143 and felt to have heart failure.  Admission was recommended however he declined as he cares for his wife who has dementia.  He reports he has had memory issues as well.  He was recently seen by Estella Husk, PA-C, and she noted several episodes of dizziness and blurry vision as well as presyncopal symptoms.  Heart rate has been low at times.  On EKG today was noted to have frequent PVCs which may be an issue.  Otherwise A-fib underlying which is permanent.  He did have an echocardiogram which showed LVEF 60 to 65%, moderate LVH and moderate left atrial enlargement.  The aortic valve was noted to be severely stenotic although the gradient was 26 mmHg, the dimensionless index was 0.24.  I suspect that the outflow gradient was under sampled.  This would not be considered a "paradoxical low-flow low gradient stenosis given normal LV function and presumed normal cardiac output.  I had a long discussion with [name] and his son today in the office about the echo findings and I feel this is likely a contributing agent to his presyncopal iron or syncopal episodes.  Bradycardia however may be related to frequent PVCs which have been noted.  PMHx:  Past Medical History:  Diagnosis Date   Anxiety    Arthritis    Atrial fibrillation (HCC)    Depression    Dysrhythmia    atrial fib   GERD (gastroesophageal reflux disease)    no meds needed- diet controlled   Heart palpitations    Hypertension    Joint pain    Obesity     Past Surgical History:  Procedure Laterality Date   CATARACT EXTRACTION Left    CATARACT EXTRACTION  W/PHACO Right 03/05/2016   Procedure: CATARACT EXTRACTION PHACO AND INTRAOCULAR LENS PLACEMENT ; CDE:  11.47;  Surgeon: Tonny Branch, MD;  Location: [facility];  Service: Ophthalmology;  Laterality: Right;   ESOPHAGOGASTRODUODENOSCOPY  03/29/2002   AQT:MAUQJF esophagus, stomach, and duodenum through the second  portion.   hernia repair Bilateral    Inguinal   TOTAL KNEE ARTHROPLASTY Right 11/13/2015   Procedure: TOTAL KNEE ARTHROPLASTY;  Surgeon: Latanya Maudlin, MD;  Location: [facility];  Service: Orthopedics;  Laterality: Right;    FAMHx:  Family History  Problem Relation Age of Onset   Stroke Mother    Hypertension Mother    Stroke Father    Stroke Sister    Heart attack Brother    Hypertension Sister    Colon cancer Neg Hx     SOCHx:   reports that he quit smoking about 46 years ago. His smoking use included cigarettes. He started smoking about 66 years ago. He has a 20.00 pack-year smoking history. He has never used smokeless tobacco. He reports current alcohol use. He reports that he does not use drugs.  ALLERGIES:  Allergies  Allergen Reactions   Ferrous Sulfate     REACTION: swelling, hives. Doesn't recall.   Sulfa Antibiotics Hives and Swelling    ROS: Pertinent items noted in HPI and remainder of comprehensive ROS otherwise negative.  HOME MEDS: Current Outpatient Medications on File Prior to Visit  Medication Sig Dispense Refill   ALPRAZolam (XANAX) 1 MG tablet TAKE ONE TABLET BY MOUTH THREE TIMES DAILY (Patient taking differently: Take 1 mg by mouth as needed.) 90 tablet 2   ARIPiprazole (ABILIFY) 2 MG tablet Take 1 tablet (2 mg total) by mouth daily. 90 tablet 3   benazepril-hydrochlorthiazide (LOTENSIN HCT) 20-12.5 MG tablet Take 1 tablet by mouth daily. 90 tablet 3   buPROPion (WELLBUTRIN XL) 300 MG 24 hr tablet Take 1 tablet (300 mg total) by mouth daily. 90 tablet 3   ELIQUIS 5 MG TABS tablet TAKE ONE TABLET BY MOUTH TWICE DAILY 180 tablet 2   furosemide (LASIX) 20 MG  tablet Take 1 tablet (20 mg total) by mouth as directed. X 3 days only, then only as advised by physician. 30 tablet 0   mirtazapine (REMERON SOL-TAB) 30 MG disintegrating tablet DISSOLVE ONE TABLET BY MOUTH AT BEDTIME 90 tablet 3   nitroGLYCERIN (NITROSTAT) 0.4 MG SL tablet DISSOLVE ONE TABLET UNDER TONGUE EVERY 5 MINUTES UP TO 3 DOSES AS NEEDED FOR CHEST PAIN. DO NOT EXCEED A TOTAL OF 3 DOSES IN 15 MINUTES. 25 tablet 0   polyethylene glycol (MIRALAX / GLYCOLAX) 17 g packet Take 17 g by mouth daily.     psyllium (METAMUCIL) 58.6 % powder Take 1 packet by mouth daily.     No current facility-administered medications on file prior to visit.    LABS/IMAGING: No results found for this or any previous visit (from the past 48 hour(s)). No results found.  LIPID PANEL: No results found for: CHOL, TRIG, HDL, CHOLHDL, VLDL, LDLCALC, LDLDIRECT   WEIGHTS: Wt Readings from Last 3 Encounters:  09/08/21 202 lb 9.6 oz (91.9 kg)  09/01/21 203 lb 12.8 oz (92.4 kg)  05/27/21 196 lb (88.9 kg)    VITALS: BP 126/76    Pulse 60    Ht 6' (1.829 m)    Wt 202 lb 9.6 oz (91.9 kg)    SpO2 97%    BMI 27.48 kg/m   EXAM: General appearance: alert, appears stated age, and no distress Neck: no carotid bruit, no JVD, and thyroid not enlarged, symmetric, no tenderness/mass/nodules Lungs: clear to auscultation bilaterally Heart: regular rate and rhythm, S1: normal, S2: Absent, and systolic murmur: late systolic 3/6, crescendo at 2nd right intercostal space Abdomen: soft, non-tender; bowel sounds normal; no masses,  no organomegaly Extremities: extremities normal, atraumatic, no cyanosis or edema Pulses: 2+ and symmetric Skin: Skin color, texture, turgor normal. No rashes or lesions Neurologic: Grossly normal Psych: Pleasant  EKG: A-fib with frequent PVCs- personally reviewed  ASSESSMENT: Severe symptomatic aortic stenosis Frequent PVCs Permanent A-fib Syncope/presyncope  PLAN: 1.   [name] has severe  symptomatic aortic stenosis.  He is also having some frequent PVCs which could be contributing to his syncope.  He is currently wearing a monitor for 2-week period.  The EKG today showed several PVCs.  He does have permanent atrial fibrillation.  His aortic stenosis clinically sounds severe without an audible second heart sound.  His gradient on echo was less than significant however his dimensionless index suggested severe aortic stenosis.  I think the envelope was likely under sampled.  We discussed the natural history of aortic stenosis and the fact that without intervention the mortality with symptomatic severe aortic stenosis could be as high as 50% at 1 year.  After discussion with his son and him he would be agreeable to more  consideration for TAVR, as I feel he would be a poor candidate for SAVR.  He is agreeable to a structural heart referral to further discuss findings.  Would also asked Selinda Eon to reach out for him for follow-up of his monitor.  Despite his elevated BNP, he does not appear in clinical heart failure and I suspect this may be atrial stretch secondary to high LVEDP in the setting of severe aortic stenosis.  Refer to structural heart clinic and follow-up with Selinda Eon in 3 months.  Pixie Casino, MD, [facility], [place] Director of the Advanced Lipid Disorders &  Cardiovascular Risk Reduction Clinic Diplomate of the American Board of Clinical Lipidology Attending Cardiologist  Direct Dial: [phone]   Fax: [phone]  Website:  [email or web address] 09/08/2021, 12:43 PM

## 2021-09-08 NOTE — Patient Instructions (Signed)
Medication Instructions:  Your physician recommends that you continue on your current medications as directed. Please refer to the Current Medication list given to you today.   Labwork: None today  Testing/Procedures: None today  Follow-Up: 3 months  Any Other Special Instructions Will Be Listed Below (If Applicable).   You have been referred to Structural Heart Disease Clinic in [place]. They will call you to schedule an appointment.   If you need a refill on your cardiac medications before your next appointment, please call your pharmacy.

## 2021-09-11 NOTE — Addendum Note (Signed)
Addended by: Levonne Hubert on: 09/11/2021 09:00 AM   Modules accepted: Orders

## 2021-09-11 NOTE — H&P (View-Only) (Signed)
Structural Heart Clinic Consult Note  Chief Complaint  Patient presents with   New Patient (Initial Visit)    Severe aortic stenosis   History of Present Illness: 88 yo male with history of anxiety, depression, permanent atrial fibrillation on Eliquis, GERD, HTN, dementia and severe aortic stenosis who is here today as a new consult in the structural heart clinic, referred by Dr. Debara Pickett, for further discussion regarding his aortic stenosis and possible stenosis. He had a recent near syncopal event and was seen in the [facility] ED 08/31/21. Echo 09/03/21 with LVEF=60-65%. Moderate LVH. Normal RV function. The aortic valve leaflets are thickened and calcified, mean gradient 26 mmHg, AVA 0.8 cm2, DI 0.24, SVI 29. This is consistent with severe paradoxical low flow,low gradient aortic stenosis. He was seen last week by Dr. Debara Pickett and given his near syncope and AS, he was referred to the structural clinic. EKG on 09/08/21 with atrial fib with PVCs. He has permanent atrial fib and is on Eliquis. He is currently wearing a cardiac monitor. He has mild memory issues.   He tells me today that he has progressive dyspnea on exertion. He has had several near syncopal events while sitting. He uses Xanax several times per day for anxiety. He has no chest pain or LE edema. He had some LE swelling piror to using Lasix. He lives in [place], [place] with his wife who has dementia. His son lives nearby and is with him hear today. He has upper partial dentures. He sees a Pharmacist, community and has no active dental issues. He is retired from [employer].   Primary Care Physician: [name], MD Primary Cardiologist: [facility] Referring Cardiologist: Debara Pickett  Past Medical History:  Diagnosis Date   Anxiety    Arthritis    Atrial fibrillation ([place])    Depression    Dysrhythmia    atrial fib   GERD (gastroesophageal reflux disease)    no meds needed- diet controlled   Heart palpitations    Hypertension     Joint pain    Obesity     Past Surgical History:  Procedure Laterality Date   CATARACT EXTRACTION Left    CATARACT EXTRACTION W/PHACO Right 03/05/2016   Procedure: CATARACT EXTRACTION PHACO AND INTRAOCULAR LENS PLACEMENT ; CDE:  11.47;  Surgeon: Tonny Branch, MD;  Location: [facility];  Service: Ophthalmology;  Laterality: Right;   ESOPHAGOGASTRODUODENOSCOPY  03/29/2002   JKD:TOIZTI esophagus, stomach, and duodenum through the second  portion.   hernia repair Bilateral    Inguinal   TOTAL KNEE ARTHROPLASTY Right 11/13/2015   Procedure: TOTAL KNEE ARTHROPLASTY;  Surgeon: Latanya Maudlin, MD;  Location: [facility];  Service: Orthopedics;  Laterality: Right;    Current Outpatient Medications  Medication Sig Dispense Refill   ALPRAZolam (XANAX) 1 MG tablet TAKE ONE TABLET BY MOUTH THREE TIMES DAILY (Patient taking differently: Take 1 mg by mouth as needed.) 90 tablet 2   ARIPiprazole (ABILIFY) 2 MG tablet Take 1 tablet (2 mg total) by mouth daily. 90 tablet 3   benazepril-hydrochlorthiazide (LOTENSIN HCT) 20-12.5 MG tablet Take 1 tablet by mouth daily. 90 tablet 3   buPROPion (WELLBUTRIN XL) 300 MG 24 hr tablet Take 1 tablet (300 mg total) by mouth daily. 90 tablet 3   ELIQUIS 5 MG TABS tablet TAKE ONE TABLET BY MOUTH TWICE DAILY 180 tablet 2   furosemide (LASIX) 20 MG tablet Take 1 tablet (20 mg total) by mouth as directed. X 3 days only, then only  as advised by physician. 30 tablet 0   mirtazapine (REMERON SOL-TAB) 30 MG disintegrating tablet DISSOLVE ONE TABLET BY MOUTH AT BEDTIME 90 tablet 3   nitroGLYCERIN (NITROSTAT) 0.4 MG SL tablet DISSOLVE ONE TABLET UNDER TONGUE EVERY 5 MINUTES UP TO 3 DOSES AS NEEDED FOR CHEST PAIN. DO NOT EXCEED A TOTAL OF 3 DOSES IN 15 MINUTES. 25 tablet 0   polyethylene glycol (MIRALAX / GLYCOLAX) 17 g packet Take 17 g by mouth daily.     psyllium (METAMUCIL) 58.6 % powder Take 1 packet by mouth daily.     No current facility-administered medications for this visit.     Allergies  Allergen Reactions   Ferrous Sulfate     REACTION: swelling, hives. Doesn't recall.   Sulfa Antibiotics Hives and Swelling    Social History   Socioeconomic History   Marital status: Married    Spouse name: Not on file   Number of children: 1   Years of education: Not on file   Highest education level: Not on file  Occupational History   Occupation: Retired from the [employer]   Smoking status: Former    Packs/day: 1.00    Years: 20.00    Pack years: 20.00    Types: Cigarettes    Start date: 08/07/1955    Quit date: 08/07/1975    Years since quitting: 46.1   Smokeless tobacco: Never  Vaping Use   Vaping Use: Never used  Substance and Sexual Activity   Alcohol use: Not Currently    Comment: occassional   Drug use: No   Sexual activity: Yes    Birth control/protection: None  Other Topics Concern   Not on file  Social History Narrative   Not on file   Social Determinants of Health   Financial Resource Strain: Low Risk    Difficulty of Paying Living Expenses: Not hard at all  Food Insecurity: No Food Insecurity   Worried About Charity fundraiser in the Last Year: Never true   Central in the Last Year: Never true  Transportation Needs: No Transportation Needs   Lack of Transportation (Medical): No   Lack of Transportation (Non-Medical): No  Physical Activity: Inactive   Days of Exercise per Week: 0 days   Minutes of Exercise per Session: 0 min  Stress: No Stress Concern Present   Feeling of Stress : Not at all  Social Connections: Moderately Isolated   Frequency of Communication with Friends and Family: More than three times a week   Frequency of Social Gatherings with Friends and Family: Three times a week   Attends Religious Services: Never   Active Member of Clubs or Organizations: No   Attends Archivist Meetings: Never   Marital Status: Married  Human resources officer Violence: Not At Risk   Fear of  Current or Ex-Partner: No   Emotionally Abused: No   Physically Abused: No   Sexually Abused: No    Family History  Problem Relation Age of Onset   Stroke Mother    Hypertension Mother    Stroke Father    Stroke Sister    Heart attack Brother    Hypertension Sister    Colon cancer Neg Hx     Review of Systems:  As stated in the HPI and otherwise negative.   BP 110/70    Pulse 66    Ht 6' (1.829 m)    Wt 196 lb 9.6 oz (89.2 kg)  SpO2 95%    BMI 26.66 kg/m   Physical Examination: General: Well developed, well nourished, NAD  HEENT: OP clear, mucus membranes moist  SKIN: warm, dry. No rashes. Neuro: No focal deficits  Musculoskeletal: Muscle strength 5/5 all ext  Psychiatric: Mood and affect normal  Neck: No JVD Lungs:Clear bilaterally, no wheezes, rhonci, crackles Cardiovascular: Regular rate and rhythm. Loud, harsh, late peaking systolic murmur.  Abdomen:Soft. Bowel sounds present. Non-tender.  Extremities: No lower extremity edema.   EKG:  EKG is not ordered today. The ekg ordered today demonstrates atrial fib with PVCs, rate 66 bpm  Echo 09/03/21:  1. Left ventricular ejection fraction, by estimation, is 60 to 65%. The  left ventricle has normal function. Left ventricular endocardial border  not optimally defined to evaluate regional wall motion. There is moderate  left ventricular hypertrophy. Left  ventricular diastolic parameters are indeterminate.   2. Right ventricular systolic function is normal. The right ventricular  size is normal. There is normal pulmonary artery systolic pressure.   3. Left atrial size was moderately dilated.   4. The mitral valve was not well visualized. No evidence of mitral valve  regurgitation. No evidence of mitral stenosis.   5. Aortic valve mean gradient 26 mmHg, AVA VTI 0.81, DI 0.24, SVI 29.  Findings would be consistent with paradoxical low flow low gradient severe  aortic stenosis. . The aortic valve was not well visualized.  There is  severe calcifcation of the aortic  valve. There is severe thickening of the aortic valve. Aortic valve  regurgitation is trivial.   FINDINGS   Left Ventricle: Left ventricular ejection fraction, by estimation, is 60  to 65%. The left ventricle has normal function. Left ventricular  endocardial border not optimally defined to evaluate regional wall motion.  Global longitudinal strain performed but   not reported based on interpreter judgement due to suboptimal tracking.  The left ventricular internal cavity size was normal in size. There is  moderate left ventricular hypertrophy. Left ventricular diastolic  parameters are indeterminate.   Right Ventricle: The right ventricular size is normal. No increase in  right ventricular wall thickness. Right ventricular systolic function is  normal. There is normal pulmonary artery systolic pressure. The tricuspid  regurgitant velocity is 2.78 m/s, and   with an assumed right atrial pressure of 3 mmHg, the estimated right  ventricular systolic pressure is 62.6 mmHg.   Left Atrium: Left atrial size was moderately dilated.   Right Atrium: Right atrial size was normal in size.   Pericardium: The pericardium was not well visualized.   Mitral Valve: The mitral valve was not well visualized. No evidence of  mitral valve regurgitation. No evidence of mitral valve stenosis.   Tricuspid Valve: The tricuspid valve is not well visualized. Tricuspid  valve regurgitation is mild . No evidence of tricuspid stenosis.   Aortic Valve: Aortic valve mean gradient 26 mmHg, AVA VTI 0.81, DI 0.24,  SVI 29. Findings would be consistent with paradoxical low flow low  gradient severe aortic stenosis. The aortic valve was not well visualized.  There is severe calcifcation of the  aortic valve. There is severe thickening of the aortic valve. There is  severe aortic valve annular calcification. Aortic valve regurgitation is  trivial. Aortic valve mean  gradient measures 25.6 mmHg. Aortic valve peak  gradient measures 48.5 mmHg. Aortic  valve area, by VTI measures 0.81 cm.   Pulmonic Valve: The pulmonic valve was not well visualized. Pulmonic valve  regurgitation is  not visualized. No evidence of pulmonic stenosis.   Aorta: The aortic root is normal in size and structure.   IAS/Shunts: The interatrial septum was not well visualized.      LEFT VENTRICLE  PLAX 2D  LVIDd:         4.27 cm   Diastology  LVIDs:         2.87 cm   LV e' medial:    9.79 cm/s  LV PW:         1.39 cm   LV E/e' medial:  9.1  LV IVS:        1.38 cm   LV e' lateral:   12.20 cm/s  LVOT diam:     2.10 cm   LV E/e' lateral: 7.3  LV SV:         63  LV SV Index:   29  LVOT Area:     3.46 cm      RIGHT VENTRICLE  RV Basal diam:  2.85 cm  RV S prime:     9.57 cm/s  TAPSE (M-mode): 1.6 cm   LEFT ATRIUM             Index        RIGHT ATRIUM           Index  LA diam:        4.00 cm 1.84 cm/m   RA Area:     20.70 cm  LA Vol (A2C):   74.2 ml 34.21 ml/m  RA Volume:   50.70 ml  23.38 ml/m  LA Vol (A4C):   97.3 ml 44.86 ml/m  LA Biplane Vol: 86.8 ml 40.02 ml/m   AORTIC VALVE  AV Area (Vmax):    0.66 cm  AV Area (Vmean):   0.70 cm  AV Area (VTI):     0.81 cm  AV Vmax:           348.20 cm/s  AV Vmean:          236.000 cm/s  AV VTI:            0.777 m  AV Peak Grad:      48.5 mmHg  AV Mean Grad:      25.6 mmHg  LVOT Vmax:         66.44 cm/s  LVOT Vmean:        47.917 cm/s  LVOT VTI:          0.183 m  LVOT/AV VTI ratio: 0.24     AORTA  Ao Root diam: 3.80 cm  Ao Asc diam:  3.50 cm   MITRAL VALVE               TRICUSPID VALVE  MV Area (PHT): 2.87 cm    TR Peak grad:   30.9 mmHg  MV Decel Time: 264 msec    TR Vmax:        278.00 cm/s  MR Peak grad: 94.5 mmHg  MR Vmax:      486.00 cm/s  SHUNTS  MV E velocity: 89.20 cm/s  Systemic VTI:  0.18 m                             Systemic Diam: 2.10 cm   Recent Labs: 05/27/2021: BUN 26; Creatinine, Ser  1.27; Hemoglobin 13.0; Platelets 110.0; Potassium 4.0; Sodium 140   Lipid Panel No results found for: CHOL, TRIG, HDL, CHOLHDL, VLDL, LDLCALC, LDLDIRECT  Wt Readings from Last 3 Encounters:  09/12/21 196 lb 9.6 oz (89.2 kg)  09/08/21 202 lb 9.6 oz (91.9 kg)  09/01/21 203 lb 12.8 oz (92.4 kg)     Other studies Reviewed: Additional studies/ records that were reviewed today include: echo images, EKG, office notes Review of the above records demonstrates: severe AS   Assessment and Plan:   1. Severe Aortic Valve Stenosis: He has severe, stage D3 paradoxical low flow/low gradient aortic valve stenosis. I have personally reviewed the echo images. The aortic valve is thickened, calcified with limited leaflet mobility. I think He would benefit from AVR. Given advanced age, he is not a good candidate for conventional AVR by surgical approach. I think he may be a good candidate for TAVR. He has mild memory issues per his son but he is very interactive during my interview and answers all questions appropriately. He cares for his wife at home who has dementia. It is also possible that some of his near syncope is related to his heart rate. He is currently wearing a cardiac monitor assessing for heart block/bradycardia.   I have reviewed the natural history of aortic stenosis with the patient and their family members  who are present today. We have discussed the limitations of medical therapy and the poor prognosis associated with symptomatic aortic stenosis. We have reviewed potential treatment options, including palliative medical therapy, conventional surgical aortic valve replacement, and transcatheter aortic valve replacement. We discussed treatment options in the context of the patient's specific comorbid medical conditions.   He would like to proceed with planning for TAVR. I will arrange a right and left heart catheterization at [facility] 09/19/21 at noon. Risks and benefits of the cath procedure and the  valve procedure are reviewed with the patient. After the cath, he will have a cardiac CT, CTA of the chest/abdomen and pelvis and will then be seen by Dr. Cyndia Bent.      Labs/ tests ordered today include:   Orders Placed This Encounter  Procedures   EKG 12-Lead   Disposition:   F/U with the valve team.   Signed, Lauree Chandler, MD 09/12/2021 3:08 PM    [facility] [address], [place], [place]  [address] Phone: [phone]; Fax: [phone]

## 2021-09-11 NOTE — Progress Notes (Signed)
Structural Heart Clinic Consult Note  Chief Complaint  Patient presents with   New Patient (Initial Visit)    Severe aortic stenosis   History of Present Illness: 88 yo male with history of anxiety, depression, permanent atrial fibrillation on Eliquis, GERD, HTN, dementia and severe aortic stenosis who is here today as a new consult in the structural heart clinic, referred by Dr. Debara Pickett, for further discussion regarding his aortic stenosis and possible stenosis. He had a recent near syncopal event and was seen in the [facility] ED 08/31/21. Echo 09/03/21 with LVEF=60-65%. Moderate LVH. Normal RV function. The aortic valve leaflets are thickened and calcified, mean gradient 26 mmHg, AVA 0.8 cm2, DI 0.24, SVI 29. This is consistent with severe paradoxical low flow,low gradient aortic stenosis. He was seen last week by Dr. Debara Pickett and given his near syncope and AS, he was referred to the structural clinic. EKG on 09/08/21 with atrial fib with PVCs. He has permanent atrial fib and is on Eliquis. He is currently wearing a cardiac monitor. He has mild memory issues.   He tells me today that he has progressive dyspnea on exertion. He has had several near syncopal events while sitting. He uses Xanax several times per day for anxiety. He has no chest pain or LE edema. He had some LE swelling piror to using Lasix. He lives in [place], [place] with his wife who has dementia. His son lives nearby and is with him hear today. He has upper partial dentures. He sees a Pharmacist, community and has no active dental issues. He is retired from [employer].   Primary Care Physician: [name], MD Primary Cardiologist: [facility] Referring Cardiologist: Debara Pickett  Past Medical History:  Diagnosis Date   Anxiety    Arthritis    Atrial fibrillation ([place])    Depression    Dysrhythmia    atrial fib   GERD (gastroesophageal reflux disease)    no meds needed- diet controlled   Heart palpitations    Hypertension     Joint pain    Obesity     Past Surgical History:  Procedure Laterality Date   CATARACT EXTRACTION Left    CATARACT EXTRACTION W/PHACO Right 03/05/2016   Procedure: CATARACT EXTRACTION PHACO AND INTRAOCULAR LENS PLACEMENT ; CDE:  11.47;  Surgeon: Tonny Branch, MD;  Location: [facility];  Service: Ophthalmology;  Laterality: Right;   ESOPHAGOGASTRODUODENOSCOPY  03/29/2002   BOF:BPZWCH esophagus, stomach, and duodenum through the second  portion.   hernia repair Bilateral    Inguinal   TOTAL KNEE ARTHROPLASTY Right 11/13/2015   Procedure: TOTAL KNEE ARTHROPLASTY;  Surgeon: Latanya Maudlin, MD;  Location: [facility];  Service: Orthopedics;  Laterality: Right;    Current Outpatient Medications  Medication Sig Dispense Refill   ALPRAZolam (XANAX) 1 MG tablet TAKE ONE TABLET BY MOUTH THREE TIMES DAILY (Patient taking differently: Take 1 mg by mouth as needed.) 90 tablet 2   ARIPiprazole (ABILIFY) 2 MG tablet Take 1 tablet (2 mg total) by mouth daily. 90 tablet 3   benazepril-hydrochlorthiazide (LOTENSIN HCT) 20-12.5 MG tablet Take 1 tablet by mouth daily. 90 tablet 3   buPROPion (WELLBUTRIN XL) 300 MG 24 hr tablet Take 1 tablet (300 mg total) by mouth daily. 90 tablet 3   ELIQUIS 5 MG TABS tablet TAKE ONE TABLET BY MOUTH TWICE DAILY 180 tablet 2   furosemide (LASIX) 20 MG tablet Take 1 tablet (20 mg total) by mouth as directed. X 3 days only, then only  as advised by physician. 30 tablet 0   mirtazapine (REMERON SOL-TAB) 30 MG disintegrating tablet DISSOLVE ONE TABLET BY MOUTH AT BEDTIME 90 tablet 3   nitroGLYCERIN (NITROSTAT) 0.4 MG SL tablet DISSOLVE ONE TABLET UNDER TONGUE EVERY 5 MINUTES UP TO 3 DOSES AS NEEDED FOR CHEST PAIN. DO NOT EXCEED A TOTAL OF 3 DOSES IN 15 MINUTES. 25 tablet 0   polyethylene glycol (MIRALAX / GLYCOLAX) 17 g packet Take 17 g by mouth daily.     psyllium (METAMUCIL) 58.6 % powder Take 1 packet by mouth daily.     No current facility-administered medications for this visit.     Allergies  Allergen Reactions   Ferrous Sulfate     REACTION: swelling, hives. Doesn't recall.   Sulfa Antibiotics Hives and Swelling    Social History   Socioeconomic History   Marital status: Married    Spouse name: Not on file   Number of children: 1   Years of education: Not on file   Highest education level: Not on file  Occupational History   Occupation: Retired from the [employer]   Smoking status: Former    Packs/day: 1.00    Years: 20.00    Pack years: 20.00    Types: Cigarettes    Start date: 08/07/1955    Quit date: 08/07/1975    Years since quitting: 46.1   Smokeless tobacco: Never  Vaping Use   Vaping Use: Never used  Substance and Sexual Activity   Alcohol use: Not Currently    Comment: occassional   Drug use: No   Sexual activity: Yes    Birth control/protection: None  Other Topics Concern   Not on file  Social History Narrative   Not on file   Social Determinants of Health   Financial Resource Strain: Low Risk    Difficulty of Paying Living Expenses: Not hard at all  Food Insecurity: No Food Insecurity   Worried About Charity fundraiser in the Last Year: Never true   [place] in the Last Year: Never true  Transportation Needs: No Transportation Needs   Lack of Transportation (Medical): No   Lack of Transportation (Non-Medical): No  Physical Activity: Inactive   Days of Exercise per Week: 0 days   Minutes of Exercise per Session: 0 min  Stress: No Stress Concern Present   Feeling of Stress : Not at all  Social Connections: Moderately Isolated   Frequency of Communication with Friends and Family: More than three times a week   Frequency of Social Gatherings with Friends and Family: Three times a week   Attends Religious Services: Never   Active Member of Clubs or Organizations: No   Attends Archivist Meetings: Never   Marital Status: Married  Human resources officer Violence: Not At Risk   Fear of  Current or Ex-Partner: No   Emotionally Abused: No   Physically Abused: No   Sexually Abused: No    Family History  Problem Relation Age of Onset   Stroke Mother    Hypertension Mother    Stroke Father    Stroke Sister    Heart attack Brother    Hypertension Sister    Colon cancer Neg Hx     Review of Systems:  As stated in the HPI and otherwise negative.   BP 110/70    Pulse 66    Ht 6' (1.829 m)    Wt 196 lb 9.6 oz (89.2 kg)  SpO2 95%    BMI 26.66 kg/m   Physical Examination: General: Well developed, well nourished, NAD  HEENT: OP clear, mucus membranes moist  SKIN: warm, dry. No rashes. Neuro: No focal deficits  Musculoskeletal: Muscle strength 5/5 all ext  Psychiatric: Mood and affect normal  Neck: No JVD Lungs:Clear bilaterally, no wheezes, rhonci, crackles Cardiovascular: Regular rate and rhythm. Loud, harsh, late peaking systolic murmur.  Abdomen:Soft. Bowel sounds present. Non-tender.  Extremities: No lower extremity edema.   EKG:  EKG is not ordered today. The ekg ordered today demonstrates atrial fib with PVCs, rate 66 bpm  Echo 09/03/21:  1. Left ventricular ejection fraction, by estimation, is 60 to 65%. The  left ventricle has normal function. Left ventricular endocardial border  not optimally defined to evaluate regional wall motion. There is moderate  left ventricular hypertrophy. Left  ventricular diastolic parameters are indeterminate.   2. Right ventricular systolic function is normal. The right ventricular  size is normal. There is normal pulmonary artery systolic pressure.   3. Left atrial size was moderately dilated.   4. The mitral valve was not well visualized. No evidence of mitral valve  regurgitation. No evidence of mitral stenosis.   5. Aortic valve mean gradient 26 mmHg, AVA VTI 0.81, DI 0.24, SVI 29.  Findings would be consistent with paradoxical low flow low gradient severe  aortic stenosis. . The aortic valve was not well visualized.  There is  severe calcifcation of the aortic  valve. There is severe thickening of the aortic valve. Aortic valve  regurgitation is trivial.   FINDINGS   Left Ventricle: Left ventricular ejection fraction, by estimation, is 60  to 65%. The left ventricle has normal function. Left ventricular  endocardial border not optimally defined to evaluate regional wall motion.  Global longitudinal strain performed but   not reported based on interpreter judgement due to suboptimal tracking.  The left ventricular internal cavity size was normal in size. There is  moderate left ventricular hypertrophy. Left ventricular diastolic  parameters are indeterminate.   Right Ventricle: The right ventricular size is normal. No increase in  right ventricular wall thickness. Right ventricular systolic function is  normal. There is normal pulmonary artery systolic pressure. The tricuspid  regurgitant velocity is 2.78 m/s, and   with an assumed right atrial pressure of 3 mmHg, the estimated right  ventricular systolic pressure is 97.9 mmHg.   Left Atrium: Left atrial size was moderately dilated.   Right Atrium: Right atrial size was normal in size.   Pericardium: The pericardium was not well visualized.   Mitral Valve: The mitral valve was not well visualized. No evidence of  mitral valve regurgitation. No evidence of mitral valve stenosis.   Tricuspid Valve: The tricuspid valve is not well visualized. Tricuspid  valve regurgitation is mild . No evidence of tricuspid stenosis.   Aortic Valve: Aortic valve mean gradient 26 mmHg, AVA VTI 0.81, DI 0.24,  SVI 29. Findings would be consistent with paradoxical low flow low  gradient severe aortic stenosis. The aortic valve was not well visualized.  There is severe calcifcation of the  aortic valve. There is severe thickening of the aortic valve. There is  severe aortic valve annular calcification. Aortic valve regurgitation is  trivial. Aortic valve mean  gradient measures 25.6 mmHg. Aortic valve peak  gradient measures 48.5 mmHg. Aortic  valve area, by VTI measures 0.81 cm.   Pulmonic Valve: The pulmonic valve was not well visualized. Pulmonic valve  regurgitation is  not visualized. No evidence of pulmonic stenosis.   Aorta: The aortic root is normal in size and structure.   IAS/Shunts: The interatrial septum was not well visualized.      LEFT VENTRICLE  PLAX 2D  LVIDd:         4.27 cm   Diastology  LVIDs:         2.87 cm   LV e' medial:    9.79 cm/s  LV PW:         1.39 cm   LV E/e' medial:  9.1  LV IVS:        1.38 cm   LV e' lateral:   12.20 cm/s  LVOT diam:     2.10 cm   LV E/e' lateral: 7.3  LV SV:         63  LV SV Index:   29  LVOT Area:     3.46 cm      RIGHT VENTRICLE  RV Basal diam:  2.85 cm  RV S prime:     9.57 cm/s  TAPSE (M-mode): 1.6 cm   LEFT ATRIUM             Index        RIGHT ATRIUM           Index  LA diam:        4.00 cm 1.84 cm/m   RA Area:     20.70 cm  LA Vol (A2C):   74.2 ml 34.21 ml/m  RA Volume:   50.70 ml  23.38 ml/m  LA Vol (A4C):   97.3 ml 44.86 ml/m  LA Biplane Vol: 86.8 ml 40.02 ml/m   AORTIC VALVE  AV Area (Vmax):    0.66 cm  AV Area (Vmean):   0.70 cm  AV Area (VTI):     0.81 cm  AV Vmax:           348.20 cm/s  AV Vmean:          236.000 cm/s  AV VTI:            0.777 m  AV Peak Grad:      48.5 mmHg  AV Mean Grad:      25.6 mmHg  LVOT Vmax:         66.44 cm/s  LVOT Vmean:        47.917 cm/s  LVOT VTI:          0.183 m  LVOT/AV VTI ratio: 0.24     AORTA  Ao Root diam: 3.80 cm  Ao Asc diam:  3.50 cm   MITRAL VALVE               TRICUSPID VALVE  MV Area (PHT): 2.87 cm    TR Peak grad:   30.9 mmHg  MV Decel Time: 264 msec    TR Vmax:        278.00 cm/s  MR Peak grad: 94.5 mmHg  MR Vmax:      486.00 cm/s  SHUNTS  MV E velocity: 89.20 cm/s  Systemic VTI:  0.18 m                             Systemic Diam: 2.10 cm   Recent Labs: 05/27/2021: BUN 26; Creatinine, Ser  1.27; Hemoglobin 13.0; Platelets 110.0; Potassium 4.0; Sodium 140   Lipid Panel No results found for: CHOL, TRIG, HDL, CHOLHDL, VLDL, LDLCALC, LDLDIRECT  Wt Readings from Last 3 Encounters:  09/12/21 196 lb 9.6 oz (89.2 kg)  09/08/21 202 lb 9.6 oz (91.9 kg)  09/01/21 203 lb 12.8 oz (92.4 kg)     Other studies Reviewed: Additional studies/ records that were reviewed today include: echo images, EKG, office notes Review of the above records demonstrates: severe AS   Assessment and Plan:   1. Severe Aortic Valve Stenosis: He has severe, stage D3 paradoxical low flow/low gradient aortic valve stenosis. I have personally reviewed the echo images. The aortic valve is thickened, calcified with limited leaflet mobility. I think He would benefit from AVR. Given advanced age, he is not a good candidate for conventional AVR by surgical approach. I think he may be a good candidate for TAVR. He has mild memory issues per his son but he is very interactive during my interview and answers all questions appropriately. He cares for his wife at home who has dementia. It is also possible that some of his near syncope is related to his heart rate. He is currently wearing a cardiac monitor assessing for heart block/bradycardia.   I have reviewed the natural history of aortic stenosis with the patient and their family members  who are present today. We have discussed the limitations of medical therapy and the poor prognosis associated with symptomatic aortic stenosis. We have reviewed potential treatment options, including palliative medical therapy, conventional surgical aortic valve replacement, and transcatheter aortic valve replacement. We discussed treatment options in the context of the patient's specific comorbid medical conditions.   He would like to proceed with planning for TAVR. I will arrange a right and left heart catheterization at [facility] 09/19/21 at noon. Risks and benefits of the cath procedure and the  valve procedure are reviewed with the patient. After the cath, he will have a cardiac CT, CTA of the chest/abdomen and pelvis and will then be seen by Dr. Cyndia Bent.      Labs/ tests ordered today include:   Orders Placed This Encounter  Procedures   EKG 12-Lead   Disposition:   F/U with the valve team.   Signed, Lauree Chandler, MD 09/12/2021 3:08 PM    [facility] [address], [place], [place]  [address] Phone: [phone]; Fax: [phone]

## 2021-09-12 ENCOUNTER — Other Ambulatory Visit: Payer: Self-pay

## 2021-09-12 ENCOUNTER — Encounter: Payer: Self-pay | Admitting: Cardiovascular Disease

## 2021-09-12 ENCOUNTER — Ambulatory Visit: Payer: Medicare Other | Admitting: Cardiovascular Disease

## 2021-09-12 VITALS — BP 110/70 | HR 66 | Ht 72.0 in | Wt 196.6 lb

## 2021-09-12 DIAGNOSIS — I350 Nonrheumatic aortic (valve) stenosis: Secondary | ICD-10-CM

## 2021-09-12 NOTE — Progress Notes (Signed)
Pre Surgical Assessment: 5 M Walk Test ? ?61M=16.71ft ? ?5 Meter Walk Test- trial 1: 10.41 seconds ?5 Meter Walk Test- trial 2: 6.48 seconds ?5 Meter Walk Test- trial 3: 7.80 seconds ?5 Meter Walk Test Average: 8.23 seconds ? ? ?

## 2021-09-12 NOTE — Patient Instructions (Signed)
Medication Instructions:  ?No changes ?*If you need a refill on your cardiac medications before your next appointment, please call your pharmacy* ? ? ?Lab Work: ?none ?If you have labs (blood work) drawn today and your tests are completely normal, you will receive your results only by: ?MyChart Message (if you have MyChart) OR ?A paper copy in the mail ?If you have any lab test that is abnormal or we need to change your treatment, we will call you to review the results. ? ? ?Testing/Procedures: ?Your physician has requested that you have a cardiac catheterization. Cardiac catheterization is used to diagnose and/or treat various heart conditions. Doctors may recommend this procedure for a number of different reasons. The most common reason is to evaluate chest pain. Chest pain can be a symptom of coronary artery disease (CAD), and cardiac catheterization can show whether plaque is narrowing or blocking your heart?s arteries. This procedure is also used to evaluate the valves, as well as measure the blood flow and oxygen levels in different parts of your heart. For further information please visit [email or web address]. Please follow instruction sheet, as given. ? ? ?Follow-Up: ?Per Structural Heart Valve Team ? ?Other Instructions ? ?[facility] ?[address], [address] ?[place] [address] ?Dept: [phone] ?Loc: [phone] ? ?KYREE ADRIANO  09/12/2021 ? ?You are scheduled for a Cardiac Catheterization on Friday, March 10 with Dr. Lauree Chandler. ? ?1. Please arrive at the Main Entrance A at [facility]: [place], [place] [address] at 10:00 AM (This time is two hours before your procedure to ensure your preparation). Free valet parking service is available.  ? ?Special note: Every effort is made to have your procedure done on time. Please understand that emergencies sometimes delay scheduled  procedures. ? ?2. Diet: Do not eat solid foods after midnight.  You may have clear liquids until 5 AM upon the day of the procedure. ? ?3. Labs: Completed 08/31/21 ? ?4. Medication instructions in preparation for your procedure: ? ? Contrast Allergy: No ? ?Hold Eliquis 2 days prior to procedure (do not take after Tues March 7) Restart after procedure as directed. ?Hold benazepril-hctz the day of procedure-Mar 10 ? ?On the morning of your procedure, Aspirin 81 mg and any morning medicines NOT listed above.  You may use sips of water. ? ?5. Plan to go home the same day, you will only stay overnight if medically necessary. ?6. You MUST have a responsible adult to drive you home. ?7. An adult MUST be with you the first 24 hours after you arrive home. ?8. Bring a current list of your medications, and the last time and date medication taken. ?9. Bring ID and current insurance cards. ?10.Please wear clothes that are easy to get on and off and wear slip-on shoes. ? ?Thank you for allowing [place] to care for you! ?  -- [facility] ?  ?

## 2021-09-15 ENCOUNTER — Ambulatory Visit: Payer: Medicare Other | Admitting: Physician Assistant

## 2021-09-16 ENCOUNTER — Telehealth: Payer: Self-pay | Admitting: Cardiovascular Disease

## 2021-09-16 NOTE — Telephone Encounter (Signed)
Okay to speak with per patient: Reviewed with Randall Lara that it is fine to take benazepril-hctz the night before cath and that someone needs to stay with patient for the first 24 hours after cath when he gets home.    ?

## 2021-09-16 NOTE — Telephone Encounter (Signed)
?  Niece is calling because the paperwork for the patient's procedure said for him not to take his benazepril-hydrochlorthiazide (LOTENSIN HCT) 20-12.5 MG tablet the morning of his procedure. Patient normally takes it at night and she would like to know if he can take it the night before.  ? ?She would also like to know if he will need someone to stay with him after he comes home from the procedure. Please advise.  ? ?Catheterization is scheduled for 09/19/21 ?

## 2021-09-18 ENCOUNTER — Telehealth: Payer: Self-pay | Admitting: Cardiovascular Disease

## 2021-09-18 NOTE — Telephone Encounter (Signed)
Patient is requesting to reschedule 3/10 procedure with Dr. Angelena Form. He is requesting PPM consideration if possible. Please return call to discuss as able. ?

## 2021-09-18 NOTE — Telephone Encounter (Addendum)
I spoke with patient and explained reason for cath tomorrow was part of evaluation/testing needed prior to TAVR. ?Patient aware TAVR would be done at later date after some additional testing. ?Patient asking about getting pacemaker and advised ,if recommended, it would also be done at later date. ? ?After discussion with patient he is willing to proceed with cath tomorrow. ? ?Cardiac Catheterization scheduled at [facility] for: Friday September 19, 2021 11:30 AM ?Arrival time and place: [facility] Main Entrance A  at: 9:30 AM-10 AM ? ? ?No solid food after midnight prior to cath, clear liquids until 5 AM day of procedure. ? ?Medication instructions: ?-Hold: ? Eliquis-none 09/17/21 until post procedure- ?-Except hold medications usual morning medications can be taken with sips of water including aspirin 81 mg. ? ?Confirmed patient has responsible adult to drive home post procedure and be with patient first 24 hours after arriving home: ? ?One visitor is allowed to stay in the waiting room during the time you are at the hospital for your procedure.  ? ?I also spoke with patient's son, Randall Lara, and reviewed instructions /plan. ?Patient's son states he was aware cath part of TAVR evaluation and patient had also been told this. ? ?Patient reports his niece, Randall Lara, [phone], will provide transportation, patient's son confirmed this.  ?Patient reports may not be able to be at hospital before 10 AM tomorrow.  ? ? ? ?

## 2021-09-19 ENCOUNTER — Encounter (HOSPITAL_COMMUNITY)
Admission: RE | Disposition: A | Discharge status: Home / Self Care | Payer: Medicare Other | Source: Home / Self Care | Attending: Cardiovascular Disease

## 2021-09-19 ENCOUNTER — Ambulatory Visit (HOSPITAL_COMMUNITY)
Admission: RE | Admit: 2021-09-19 | Discharge: 2021-09-19 | Disposition: A | Discharge status: Home / Self Care | Payer: Medicare Other | Attending: Cardiovascular Disease | Admitting: Cardiovascular Disease

## 2021-09-19 ENCOUNTER — Other Ambulatory Visit: Payer: Self-pay

## 2021-09-19 DIAGNOSIS — K219 Gastro-esophageal reflux disease without esophagitis: Secondary | ICD-10-CM | POA: Insufficient documentation

## 2021-09-19 DIAGNOSIS — I2510 Atherosclerotic heart disease of native coronary artery without angina pectoris: Secondary | ICD-10-CM

## 2021-09-19 DIAGNOSIS — I350 Nonrheumatic aortic (valve) stenosis: Secondary | ICD-10-CM

## 2021-09-19 DIAGNOSIS — F03C4 Unspecified dementia, severe, with anxiety: Secondary | ICD-10-CM | POA: Diagnosis not present

## 2021-09-19 DIAGNOSIS — I4821 Permanent atrial fibrillation: Secondary | ICD-10-CM | POA: Insufficient documentation

## 2021-09-19 DIAGNOSIS — Z7901 Long term (current) use of anticoagulants: Secondary | ICD-10-CM | POA: Insufficient documentation

## 2021-09-19 DIAGNOSIS — I10 Essential (primary) hypertension: Secondary | ICD-10-CM | POA: Insufficient documentation

## 2021-09-19 DIAGNOSIS — Z79899 Other long term (current) drug therapy: Secondary | ICD-10-CM | POA: Insufficient documentation

## 2021-09-19 DIAGNOSIS — Z87891 Personal history of nicotine dependence: Secondary | ICD-10-CM | POA: Insufficient documentation

## 2021-09-19 DIAGNOSIS — I253 Aneurysm of heart: Secondary | ICD-10-CM | POA: Insufficient documentation

## 2021-09-19 DIAGNOSIS — R0609 Other forms of dyspnea: Secondary | ICD-10-CM | POA: Diagnosis not present

## 2021-09-19 HISTORY — PX: RIGHT/LEFT HEART CATH AND CORONARY ANGIOGRAPHY: CATH118266

## 2021-09-19 LAB — POCT I-STAT EG7
Acid-Base Excess: 4 mmol/L — ABNORMAL HIGH (ref 0.0–2.0)
Bicarbonate: 29.4 mmol/L — ABNORMAL HIGH (ref 20.0–28.0)
Calcium, Ion: 1.24 mmol/L (ref 1.15–1.40)
HCT: 37 % — ABNORMAL LOW (ref 39.0–52.0)
Hemoglobin: 12.6 g/dL — ABNORMAL LOW (ref 13.0–17.0)
O2 Saturation: 73 %
Potassium: 3.8 mmol/L (ref 3.5–5.1)
Sodium: 142 mmol/L (ref 135–145)
TCO2: 31 mmol/L (ref 22–32)
pCO2, Ven: 47.9 mmHg (ref 44–60)
pH, Ven: 7.396 (ref 7.25–7.43)
pO2, Ven: 40 mmHg (ref 32–45)

## 2021-09-19 LAB — POCT I-STAT 7, (LYTES, BLD GAS, ICA,H+H)
Acid-Base Excess: 2 mmol/L (ref 0.0–2.0)
Bicarbonate: 26.9 mmol/L (ref 20.0–28.0)
Calcium, Ion: 1.16 mmol/L (ref 1.15–1.40)
HCT: 35 % — ABNORMAL LOW (ref 39.0–52.0)
Hemoglobin: 11.9 g/dL — ABNORMAL LOW (ref 13.0–17.0)
O2 Saturation: 95 %
Potassium: 3.6 mmol/L (ref 3.5–5.1)
Sodium: 143 mmol/L (ref 135–145)
TCO2: 28 mmol/L (ref 22–32)
pCO2 arterial: 41.2 mmHg (ref 32–48)
pH, Arterial: 7.423 (ref 7.35–7.45)
pO2, Arterial: 76 mmHg — ABNORMAL LOW (ref 83–108)

## 2021-09-19 SURGERY — RIGHT/LEFT HEART CATH AND CORONARY ANGIOGRAPHY
Anesthesia: LOCAL

## 2021-09-19 MED ORDER — HEPARIN (PORCINE) IN NACL 1000-0.9 UT/500ML-% IV SOLN
INTRAVENOUS | Status: DC | PRN
  Administered 2021-09-19 (×2): 500 mL

## 2021-09-19 MED ORDER — SODIUM CHLORIDE 0.9% FLUSH: 3.0000 mL | Freq: Two times a day (BID) | INTRAVENOUS | Status: DC

## 2021-09-19 MED ORDER — SODIUM CHLORIDE 0.9% FLUSH: 3.0000 mL | INTRAVENOUS | Status: DC | PRN

## 2021-09-19 MED ORDER — HYDRALAZINE HCL 20 MG/ML IJ SOLN: 10.0000 mg | INTRAMUSCULAR | Status: DC | PRN

## 2021-09-19 MED ORDER — LIDOCAINE HCL (PF) 1 % IJ SOLN
INTRAMUSCULAR | Status: DC | PRN
Start: 2021-09-19 — End: 2021-09-19
  Administered 2021-09-19: 5 mL

## 2021-09-19 MED ORDER — ACETAMINOPHEN 325 MG PO TABS: 650.0000 mg | ORAL_TABLET | ORAL | Status: DC | PRN

## 2021-09-19 MED ORDER — VERAPAMIL HCL 2.5 MG/ML IV SOLN
INTRAVENOUS | Status: AC
  Filled 2021-09-19: qty 2

## 2021-09-19 MED ORDER — SODIUM CHLORIDE 0.9 % IV SOLN: 250.0000 mL | INTRAVENOUS | Status: DC | PRN

## 2021-09-19 MED ORDER — HEPARIN (PORCINE) IN NACL 1000-0.9 UT/500ML-% IV SOLN
INTRAVENOUS | Status: AC
  Filled 2021-09-19: qty 1000

## 2021-09-19 MED ORDER — SODIUM CHLORIDE 0.9 % IV SOLN: INTRAVENOUS | Status: AC

## 2021-09-19 MED ORDER — SODIUM CHLORIDE 0.9 % WEIGHT BASED INFUSION
3.0000 mL/kg/h | INTRAVENOUS | Status: AC
  Administered 2021-09-19: 3 mL/kg/h via INTRAVENOUS

## 2021-09-19 MED ORDER — VERAPAMIL HCL 2.5 MG/ML IV SOLN
INTRAVENOUS | Status: DC | PRN
  Administered 2021-09-19: 10 mL via INTRA_ARTERIAL

## 2021-09-19 MED ORDER — SODIUM CHLORIDE 0.9 % WEIGHT BASED INFUSION: 1.0000 mL/kg/h | INTRAVENOUS | Status: DC

## 2021-09-19 MED ORDER — LABETALOL HCL 5 MG/ML IV SOLN: 10.0000 mg | INTRAVENOUS | Status: DC | PRN

## 2021-09-19 MED ORDER — ASPIRIN 81 MG PO CHEW: 81.0000 mg | CHEWABLE_TABLET | ORAL | Status: DC

## 2021-09-19 MED ORDER — LIDOCAINE HCL (PF) 1 % IJ SOLN
INTRAMUSCULAR | Status: AC
  Filled 2021-09-19: qty 30

## 2021-09-19 MED ORDER — HEPARIN SODIUM (PORCINE) 1000 UNIT/ML IJ SOLN
INTRAMUSCULAR | Status: AC
  Filled 2021-09-19: qty 10

## 2021-09-19 MED ORDER — HEPARIN SODIUM (PORCINE) 1000 UNIT/ML IJ SOLN
INTRAMUSCULAR | Status: DC | PRN
  Administered 2021-09-19: 4500 [IU] via INTRAVENOUS

## 2021-09-19 MED ORDER — IOHEXOL 350 MG/ML SOLN
INTRAVENOUS | Status: DC | PRN
  Administered 2021-09-19: 50 mL

## 2021-09-19 MED ORDER — ONDANSETRON HCL 4 MG/2ML IJ SOLN
4.0000 mg | Freq: Four times a day (QID) | INTRAMUSCULAR | Status: DC | PRN
Start: 2021-09-19 — End: 2021-09-19

## 2021-09-19 SURGICAL SUPPLY — 13 items
CATH BALLN WEDGE 5F 110CM (CATHETERS) ×2 IMPLANT
CATH INFINITI 5 FR JL3.5 (CATHETERS) ×2 IMPLANT
CATH INFINITI JR4 5F (CATHETERS) ×2 IMPLANT
DEVICE RAD COMP TR BAND LRG (VASCULAR PRODUCTS) ×2 IMPLANT
GLIDESHEATH SLEND SS 6F .021 (SHEATH) ×2 IMPLANT
GUIDEWIRE .025 260CM (WIRE) ×2 IMPLANT
GUIDEWIRE INQWIRE 1.5J.035X260 (WIRE) IMPLANT
INQWIRE 1.5J .035X260CM (WIRE) ×3
KIT HEART LEFT (KITS) ×3 IMPLANT
PACK CARDIAC CATHETERIZATION (CUSTOM PROCEDURE TRAY) ×3 IMPLANT
SHEATH GLIDE SLENDER 4/5FR (SHEATH) ×2 IMPLANT
TRANSDUCER W/STOPCOCK (MISCELLANEOUS) ×3 IMPLANT
TUBING CIL FLEX 10 FLL-RA (TUBING) ×3 IMPLANT

## 2021-09-19 NOTE — Interval H&P Note (Signed)
History and Physical Interval Note: ? ?09/19/2021 ?9:57 AM ? ?[name]  has presented today for surgery, with the diagnosis of severe aortic stenosis.  The various methods of treatment have been discussed with the patient and family. After consideration of risks, benefits and other options for treatment, the patient has consented to  Procedure(s): ?RIGHT/LEFT HEART CATH AND CORONARY ANGIOGRAPHY (N/A) as a surgical intervention.  The patient's history has been reviewed, patient examined, no change in status, stable for surgery.  I have reviewed the patient's chart and labs.  Questions were answered to the patient's satisfaction.   ? ?Cath Lab Visit (complete for each Cath Lab visit) ? ?Clinical Evaluation Leading to the Procedure:  ? ?ACS: No. ? ?Non-ACS:   ? ?Anginal Classification: CCS II ? ?Anti-ischemic medical therapy: No Therapy ? ?Non-Invasive Test Results: No non-invasive testing performed ? ?Prior CABG: No previous CABG ? ? ? ? ? ? ? ?Lauree Chandler ? ? ?

## 2021-09-19 NOTE — Discharge Instructions (Signed)
Resume Eliquis tomorrow if no bleeding from right arm cath sites ?

## 2021-09-22 ENCOUNTER — Encounter (HOSPITAL_COMMUNITY): Payer: Self-pay | Admitting: Cardiovascular Disease

## 2021-09-23 ENCOUNTER — Telehealth: Payer: Self-pay

## 2021-09-23 DIAGNOSIS — I4891 Unspecified atrial fibrillation: Secondary | ICD-10-CM | POA: Diagnosis not present

## 2021-09-23 DIAGNOSIS — R55 Syncope and collapse: Secondary | ICD-10-CM | POA: Diagnosis not present

## 2021-09-23 NOTE — Telephone Encounter (Signed)
-----   Message from Burr Medico sent at 09/23/2021  1:38 PM EDT ----- ?Patient called and cancelled his new patient appt. He doesn't want to pursue. ?SB ? ?

## 2021-09-24 ENCOUNTER — Telehealth: Payer: Self-pay | Admitting: Physician Assistant

## 2021-09-24 NOTE — Telephone Encounter (Signed)
Spoke with Jamie Brookes who reports pt's monitor results are now posted and ready for review.  She reports pt had an episode of Afib with rate of 38bpm for 60 seconds ( strip 6, page 13) and 2 runs of VT for 18 beats, fastest rate of 167.  This occurred on 09/14/2021 at 303pm.  ?

## 2021-09-25 ENCOUNTER — Telehealth: Payer: Self-pay

## 2021-09-25 NOTE — Telephone Encounter (Signed)
Pt scheduled  to see Dr Lovena Le 09/30/2021. ?

## 2021-09-25 NOTE — Telephone Encounter (Signed)
My chart message sent to me in regards to pt not wanting to proceed with TAVR evaluation and surgery.  The pt's niece, Randall Lara, has already cancelled his TAVR CT scans and Dr Cyndia Bent appointment.  When speaking with [name] the pt made her aware that he is 88 years old and he does not want to undergo additional testing or surgery at this time in regards to his heart valve. I advised [name] that if the pt changes his mind and would like to purse TAVR in the future to please contact our team.   ?

## 2021-09-26 ENCOUNTER — Ambulatory Visit (HOSPITAL_COMMUNITY): Admit: 2021-09-26 | Payer: Medicare Other

## 2021-09-30 ENCOUNTER — Ambulatory Visit: Payer: Medicare Other | Admitting: Internal Medicine

## 2021-09-30 ENCOUNTER — Encounter: Payer: Self-pay | Admitting: Internal Medicine

## 2021-09-30 ENCOUNTER — Other Ambulatory Visit: Payer: Self-pay

## 2021-09-30 VITALS — BP 140/78 | HR 49 | Ht 72.0 in | Wt 198.6 lb

## 2021-09-30 DIAGNOSIS — R55 Syncope and collapse: Secondary | ICD-10-CM | POA: Diagnosis not present

## 2021-09-30 DIAGNOSIS — I4891 Unspecified atrial fibrillation: Secondary | ICD-10-CM | POA: Diagnosis not present

## 2021-09-30 NOTE — Patient Instructions (Signed)
Medication Instructions:  °Your physician recommends that you continue on your current medications as directed. Please refer to the Current Medication list given to you today. ° °*If you need a refill on your cardiac medications before your next appointment, please call your pharmacy* ° ° °Lab Work: °NONE  ° °If you have labs (blood work) drawn today and your tests are completely normal, you will receive your results only by: °MyChart Message (if you have MyChart) OR °A paper copy in the mail °If you have any lab test that is abnormal or we need to change your treatment, we will call you to review the results. ° ° °Testing/Procedures: °NONE  ° ° °Follow-Up: °At [facility], you and your health needs are our priority.  As part of our continuing mission to provide you with exceptional heart care, we have created designated Provider Care Teams.  These Care Teams include your primary Cardiologist (physician) and Advanced Practice Providers (APPs -  Physician Assistants and Nurse Practitioners) who all work together to provide you with the care you need, when you need it. ° °We recommend signing up for the patient portal called "MyChart".  Sign up information is provided on this After Visit Summary.  MyChart is used to connect with patients for Virtual Visits (Telemedicine).  Patients are able to view lab/test results, encounter notes, upcoming appointments, etc.  Non-urgent messages can be sent to your provider as well.   °To learn more about what you can do with MyChart, go to [email or web address].   ° °Your next appointment:   ° As Needed  ° °The format for your next appointment:   °In Person ° °Provider:   °Gregg Taylor, MD  ° ° °Other Instructions °Thank you for choosing [facility]! °  ° ° °

## 2021-09-30 NOTE — Progress Notes (Signed)
? ? ? ? ?HPI ?Mr. Heuring is referred by Dr. Angelena Form for consideration for PPM insertion. 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 He has undergone workup and also has moderate CAD. He has elected not to have a TAVR. The patient's subsequent workup demonstrates atrial fib from the 30's to the 90's as well as NSVT. We do not have a rhythm diagnosis to correlate his heart block. His 12 lead ECG demonstrates atrial fib with a controlled VR and PVC's ?Allergies  ?Allergen Reactions  ? Ferrous Sulfate Hives and Swelling  ?  Patient doesn't recall.  ? Sulfa Antibiotics Hives and Swelling  ?  Patient doesn't recall.  ? ? ? ?Current Outpatient Medications  ?Medication Sig Dispense Refill  ? ALPRAZolam (XANAX) 1 MG tablet TAKE ONE TABLET BY MOUTH THREE TIMES DAILY (Patient taking differently: Take 0.5-3 mg by mouth 3 (three) times daily as needed for anxiety.) 90 tablet 2  ? ARIPiprazole (ABILIFY) 2 MG tablet Take 1 tablet (2 mg total) by mouth daily. 90 tablet 3  ? benazepril-hydrochlorthiazide (LOTENSIN HCT) 20-12.5 MG tablet Take 1 tablet by mouth daily. 90 tablet 3  ? buPROPion (WELLBUTRIN XL) 300 MG 24 hr tablet Take 1 tablet (300 mg total) by mouth daily. 90 tablet 3  ? ELIQUIS 5 MG TABS tablet TAKE ONE TABLET BY MOUTH TWICE DAILY 180 tablet 2  ? furosemide (LASIX) 20 MG tablet Take 1 tablet (20 mg total) by mouth as directed. X 3 days only, then only as advised by physician. 30 tablet 0  ? mirtazapine (REMERON SOL-TAB) 30 MG disintegrating tablet DISSOLVE ONE TABLET BY MOUTH AT BEDTIME (Patient taking differently: Take 30 mg by mouth at bedtime. DISSOLVE ONE TABLET BY MOUTH AT BEDTIME) 90 tablet 3  ? nitroGLYCERIN (NITROSTAT) 0.4 MG SL tablet DISSOLVE ONE TABLET UNDER TONGUE EVERY 5 MINUTES UP TO 3 DOSES AS NEEDED FOR CHEST PAIN. DO NOT EXCEED A TOTAL OF 3 DOSES IN 15 MINUTES. 25 tablet 0  ? polyethylene glycol (MIRALAX / GLYCOLAX) 17 g packet Take 17 g by mouth daily.     ? psyllium (METAMUCIL) 58.6 % powder Take 1 packet by mouth daily.    ? ?No current facility-administered medications for this visit.  ? ? ? ?Past Medical History:  ?Diagnosis Date  ? Anxiety   ? Arthritis   ? Atrial fibrillation ([place])   ? Depression   ? Dysrhythmia   ? atrial fib  ? GERD (gastroesophageal reflux disease)   ? no meds needed- diet controlled  ? Heart palpitations   ? Hypertension   ? Joint pain   ? Obesity   ? ? ?ROS: ? ? All systems reviewed and negative except as noted in the HPI. ? ? ?Past Surgical History:  ?Procedure Laterality Date  ? CATARACT EXTRACTION Left   ? CATARACT EXTRACTION W/PHACO Right 03/05/2016  ? Procedure: CATARACT EXTRACTION PHACO AND INTRAOCULAR LENS PLACEMENT ; CDE:  11.47;  Surgeon: Tonny Branch, MD;  Location: [facility];  Service: Ophthalmology;  Laterality: Right;  ? ESOPHAGOGASTRODUODENOSCOPY  03/29/2002  ? SHF:WYOVZC esophagus, stomach, and duodenum through the second  portion.  ? hernia repair Bilateral   ? Inguinal  ? RIGHT/LEFT HEART CATH AND CORONARY ANGIOGRAPHY N/A 09/19/2021  ? Procedure: RIGHT/LEFT HEART CATH AND CORONARY ANGIOGRAPHY;  Surgeon: Burnell Blanks, MD;  Location: [facility];  Service: Cardiovascular;  Laterality: N/A;  ? TOTAL KNEE ARTHROPLASTY Right 11/13/2015  ?  Procedure: TOTAL KNEE ARTHROPLASTY;  Surgeon: Latanya Maudlin, MD;  Location: [facility];  Service: Orthopedics;  Laterality: Right;  ? ? ? ?Family History  ?Problem Relation Age of Onset  ? Stroke Mother   ? Hypertension Mother   ? Stroke Father   ? Stroke Sister   ? Heart attack Brother   ? Hypertension Sister   ? Colon cancer Neg Hx   ? ? ? ?Social History  ? ?Socioeconomic History  ? Marital status: Married  ?  Spouse name: Not on file  ? Number of children: 1  ? Years of education: Not on file  ? Highest education level: Not on file  ?Occupational History  ? Occupation: Retired from [employer]  ?Tobacco Use  ? Smoking status: Former  ?  Packs/day: 1.00  ?  Years: 20.00   ?  Pack years: 20.00  ?  Types: Cigarettes  ?  Start date: 08/07/1955  ?  Quit date: 08/07/1975  ?  Years since quitting: 46.1  ? Smokeless tobacco: Never  ?Vaping Use  ? Vaping Use: Never used  ?Substance and Sexual Activity  ? Alcohol use: Not Currently  ?  Comment: occassional  ? Drug use: No  ? Sexual activity: Yes  ?  Birth control/protection: None  ?Other Topics Concern  ? Not on file  ?Social History Narrative  ? Not on file  ? ?Social Determinants of Health  ? ?Financial Resource Strain: Low Risk   ? Difficulty of Paying Living Expenses: Not hard at all  ?Food Insecurity: No Food Insecurity  ? Worried About Charity fundraiser in the Last Year: Never true  ? Ran Out of Food in the Last Year: Never true  ?Transportation Needs: No Transportation Needs  ? Lack of Transportation (Medical): No  ? Lack of Transportation (Non-Medical): No  ?Physical Activity: Inactive  ? Days of Exercise per Week: 0 days  ? Minutes of Exercise per Session: 0 min  ?Stress: No Stress Concern Present  ? Feeling of Stress : Not at all  ?Social Connections: Moderately Isolated  ? Frequency of Communication with Friends and Family: More than three times a week  ? Frequency of Social Gatherings with Friends and Family: Three times a week  ? Attends Religious Services: Never  ? Active Member of Clubs or Organizations: No  ? Attends Archivist Meetings: Never  ? Marital Status: Married  ?Intimate Partner Violence: Not At Risk  ? Fear of Current or Ex-Partner: No  ? Emotionally Abused: No  ? Physically Abused: No  ? Sexually Abused: No  ? ? ? ?BP 140/78   Pulse (!) 49   Ht 6' (1.829 m)   Wt 198 lb 9.6 oz (90.1 kg)   SpO2 96%   BMI 26.94 kg/m?  ? ?Physical Exam: ? ?Well appearing NAD ?HEENT: Unremarkable ?Neck:  No JVD, no thyromegally ?Lymphatics:  No adenopathy ?Back:  No CVA tenderness ?Lungs:  Clear with no wheezes ?HEART:  IRegular rate rhythm, 1/6 systolic murmur of AS, no rubs, no clicks ?Abd:  soft, positive bowel  sounds, no organomegally, no rebound, no guarding ?Ext:  2 plus pulses, no edema, no cyanosis, no clubbing ?Skin:  No rashes no nodules ?Neuro:  CN II through XII intact, motor grossly intact ? ?Assess/Plan:  ?Syncope -I am not convinced his syncope is do to bradycardia though I cannot rule it out. I have offered him watchful waiting and ILR insertion. He would like to undergo watchful waiting. ?  Persistent atrial fib - his rates are well controlled. Continue current meds. ?AS - he has surgical disease but refuses TAVR. He understands that his long term prognosis is poor but reminds me that he is 64.  ? ?Carleene Overlie Leeam Cedrone,MD ?

## 2021-10-06 ENCOUNTER — Emergency Department (HOSPITAL_COMMUNITY): Payer: Medicare Other

## 2021-10-06 ENCOUNTER — Emergency Department (HOSPITAL_COMMUNITY)
Admission: EM | Admit: 2021-10-06 | Discharge: 2021-10-06 | Disposition: A | Discharge status: Home / Self Care | Payer: Medicare Other | Attending: Emergency Medicine | Admitting: Emergency Medicine

## 2021-10-06 ENCOUNTER — Encounter (HOSPITAL_COMMUNITY): Payer: Self-pay | Admitting: Emergency Medicine

## 2021-10-06 ENCOUNTER — Other Ambulatory Visit: Payer: Self-pay

## 2021-10-06 DIAGNOSIS — R200 Anesthesia of skin: Secondary | ICD-10-CM

## 2021-10-06 DIAGNOSIS — R202 Paresthesia of skin: Secondary | ICD-10-CM | POA: Diagnosis not present

## 2021-10-06 DIAGNOSIS — I4891 Unspecified atrial fibrillation: Secondary | ICD-10-CM | POA: Diagnosis not present

## 2021-10-06 DIAGNOSIS — G319 Degenerative disease of nervous system, unspecified: Secondary | ICD-10-CM | POA: Diagnosis not present

## 2021-10-06 DIAGNOSIS — Z7901 Long term (current) use of anticoagulants: Secondary | ICD-10-CM | POA: Insufficient documentation

## 2021-10-06 DIAGNOSIS — R011 Cardiac murmur, unspecified: Secondary | ICD-10-CM | POA: Diagnosis not present

## 2021-10-06 LAB — CBC WITH DIFFERENTIAL/PLATELET
Abs Immature Granulocytes: 0.01 10*3/uL (ref 0.00–0.07)
Basophils Absolute: 0 10*3/uL (ref 0.0–0.1)
Basophils Relative: 0 %
Eosinophils Absolute: 0.1 10*3/uL (ref 0.0–0.5)
Eosinophils Relative: 2 %
HCT: 38.9 % — ABNORMAL LOW (ref 39.0–52.0)
Hemoglobin: 12.6 g/dL — ABNORMAL LOW (ref 13.0–17.0)
Immature Granulocytes: 0 %
Lymphocytes Relative: 30 %
Lymphs Abs: 1.4 10*3/uL (ref 0.7–4.0)
MCH: 31.7 pg (ref 26.0–34.0)
MCHC: 32.4 g/dL (ref 30.0–36.0)
MCV: 97.7 fL (ref 80.0–100.0)
Monocytes Absolute: 0.5 10*3/uL (ref 0.1–1.0)
Monocytes Relative: 12 %
Neutro Abs: 2.7 10*3/uL (ref 1.7–7.7)
Neutrophils Relative %: 56 %
Platelets: 106 10*3/uL — ABNORMAL LOW (ref 150–400)
RBC: 3.98 MIL/uL — ABNORMAL LOW (ref 4.22–5.81)
RDW: 14.1 % (ref 11.5–15.5)
WBC: 4.7 10*3/uL (ref 4.0–10.5)
nRBC: 0 % (ref 0.0–0.2)

## 2021-10-06 LAB — BASIC METABOLIC PANEL
Anion gap: 7 (ref 5–15)
BUN: 28 mg/dL — ABNORMAL HIGH (ref 8–23)
CO2: 29 mmol/L (ref 22–32)
Calcium: 9.5 mg/dL (ref 8.9–10.3)
Chloride: 106 mmol/L (ref 98–111)
Creatinine, Ser: 1.3 mg/dL — ABNORMAL HIGH (ref 0.61–1.24)
GFR, Estimated: 52 mL/min — ABNORMAL LOW (ref >60)
Glucose, Bld: 96 mg/dL (ref 70–99)
Potassium: 3.8 mmol/L (ref 3.5–5.1)
Sodium: 142 mmol/L (ref 135–145)

## 2021-10-06 LAB — PROTIME-INR
INR: 1.2 (ref 0.8–1.2)
Prothrombin Time: 15.4 seconds — ABNORMAL HIGH (ref 11.4–15.2)

## 2021-10-06 NOTE — Discharge Instructions (Signed)
You were seen in the emergency department for some numbness in your left leg below your knee.  You had an MRI that did not show any sign of acute stroke.  Your lab work was also unremarkable.  Please follow-up with your primary care doctor.  Return to the emergency department if any worsening or concerning symptoms. ?

## 2021-10-06 NOTE — ED Notes (Signed)
Pt reports that numbness has currently subsided. EDP at bedside. Neuro remains WDL ?

## 2021-10-06 NOTE — ED Triage Notes (Signed)
Pt reports left leg numbness from his knee down; unsure what time it started but thinks he noticed it around 1100-1200; denies any other symptoms; pt able to move all extremities; neuro check negative  ?

## 2021-10-06 NOTE — ED Provider Notes (Signed)
?[facility] ?Provider Note ? ? ?CSN: [id number] ?Arrival date & time: 10/06/21  1601 ? ?  ? ?History ? ?No chief complaint on file. ? ? ?Randall Lara is a 88 y.o. male.  He has a history of A-fib on anticoagulation, aortic stenosis.  He is complaining of numbness in his left leg below his knee to his foot that started around 11 AM today.  Occurred while he was sitting in chair but lasted multiple hours.  He feels like it may be improving.  Does not appreciate any weakness.  Denies any other focal numbness or weakness.  He is concerned that he may be having a stroke.  No prior history of stroke.  Family member states he has severe aortic stenosis and is supposed to get a valve replacement but patient declined to get it done. ? ?The history is provided by the patient and a relative.  ?Cerebrovascular Accident ?This is a new problem. The current episode started 6 to 12 hours ago. The problem occurs constantly. The problem has been gradually improving. Pertinent negatives include no chest pain, no abdominal pain, no headaches and no shortness of breath. Nothing aggravates the symptoms. Nothing relieves the symptoms. He has tried nothing for the symptoms. The treatment provided mild relief.  ? ?  ? ?Home Medications ?Prior to Admission medications   ?Medication Sig Start Date End Date Taking? Authorizing Provider  ?ALPRAZolam (XANAX) 1 MG tablet TAKE ONE TABLET BY MOUTH THREE TIMES DAILY ?Patient taking differently: Take 0.5-3 mg by mouth 3 (three) times daily as needed for anxiety. 08/12/21   Burchette, Alinda Sierras, MD  ?ARIPiprazole (ABILIFY) 2 MG tablet Take 1 tablet (2 mg total) by mouth daily. 05/27/21   Burchette, Alinda Sierras, MD  ?benazepril-hydrochlorthiazide (LOTENSIN HCT) 20-12.5 MG tablet Take 1 tablet by mouth daily. 05/27/21   Burchette, Alinda Sierras, MD  ?buPROPion (WELLBUTRIN XL) 300 MG 24 hr tablet Take 1 tablet (300 mg total) by mouth daily. 05/27/21   Burchette, Alinda Sierras, MD  ?Arne Cleveland 5 MG TABS  tablet TAKE ONE TABLET BY MOUTH TWICE DAILY 07/15/21   Burchette, Alinda Sierras, MD  ?furosemide (LASIX) 20 MG tablet Take 1 tablet (20 mg total) by mouth as directed. X 3 days only, then only as advised by physician. 09/01/21 11/30/21  Imogene Burn, PA-C  ?mirtazapine (REMERON SOL-TAB) 30 MG disintegrating tablet DISSOLVE ONE TABLET BY MOUTH AT BEDTIME ?Patient taking differently: Take 30 mg by mouth at bedtime. DISSOLVE ONE TABLET BY MOUTH AT BEDTIME 05/27/21   Burchette, Alinda Sierras, MD  ?nitroGLYCERIN (NITROSTAT) 0.4 MG SL tablet DISSOLVE ONE TABLET UNDER TONGUE EVERY 5 MINUTES UP TO 3 DOSES AS NEEDED FOR CHEST PAIN. DO NOT EXCEED A TOTAL OF 3 DOSES IN 15 MINUTES. 12/31/20   Burchette, Alinda Sierras, MD  ?polyethylene glycol (MIRALAX / GLYCOLAX) 17 g packet Take 17 g by mouth daily.    [provider]  ?psyllium (METAMUCIL) 58.6 % powder Take 1 packet by mouth daily.    [provider]  ?   ? ?Allergies    ?Ferrous sulfate and Sulfa antibiotics   ? ?Review of Systems   ?Review of Systems  ?Constitutional:  Negative for fever.  ?Eyes:  Negative for visual disturbance.  ?Respiratory:  Negative for shortness of breath.   ?Cardiovascular:  Negative for chest pain.  ?Gastrointestinal:  Negative for abdominal pain.  ?Musculoskeletal:  Negative for back pain.  ?Skin:  Negative for rash.  ?Neurological:  Positive for numbness.  Negative for speech difficulty, weakness and headaches.  ? ?Physical Exam ?Updated Vital Signs ?BP 137/69 (BP Location: Right Arm)   Pulse (!) 53   Temp 97.9 ?F (36.6 ?C) (Oral)   Resp 18   Ht 6' (1.829 m)   Wt 89.8 kg   SpO2 97%   BMI 26.85 kg/m?  ?Physical Exam ?Vitals and nursing note reviewed.  ?Constitutional:   ?   General: He is not in acute distress. ?   Appearance: Normal appearance. He is well-developed.  ?HENT:  ?   Head: Normocephalic and atraumatic.  ?Eyes:  ?   Conjunctiva/sclera: Conjunctivae normal.  ?Cardiovascular:  ?   Rate and Rhythm: Normal rate and regular rhythm.  ?    Heart sounds: Murmur (systolic) heard.  ?Pulmonary:  ?   Effort: Pulmonary effort is normal. No respiratory distress.  ?   Breath sounds: Normal breath sounds.  ?Abdominal:  ?   Palpations: Abdomen is soft.  ?   Tenderness: There is no abdominal tenderness. There is no guarding or rebound.  ?Musculoskeletal:     ?   General: No swelling or tenderness. Normal range of motion.  ?   Cervical back: Neck supple.  ?   Right lower leg: No edema.  ?   Left lower leg: No edema.  ?Skin: ?   General: Skin is warm and dry.  ?   Capillary Refill: Capillary refill takes less than 2 seconds.  ?Neurological:  ?   General: No focal deficit present.  ?   Mental Status: He is alert.  ?   Cranial Nerves: No cranial nerve deficit.  ?   Sensory: No sensory deficit.  ?   Motor: No weakness.  ?   Gait: Gait normal.  ? ? ?ED Results / Procedures / Treatments   ?Labs ?(all labs ordered are listed, but only abnormal results are displayed) ?Labs Reviewed  ?BASIC METABOLIC PANEL - Abnormal; Notable for the following components:  ?    Result Value  ? BUN 28 (*)   ? Creatinine, Ser 1.30 (*)   ? GFR, Estimated 52 (*)   ? All other components within normal limits  ?CBC WITH DIFFERENTIAL/PLATELET - Abnormal; Notable for the following components:  ? RBC 3.98 (*)   ? Hemoglobin 12.6 (*)   ? HCT 38.9 (*)   ? Platelets 106 (*)   ? All other components within normal limits  ?PROTIME-INR - Abnormal; Notable for the following components:  ? Prothrombin Time 15.4 (*)   ? All other components within normal limits  ? ? ?EKG ?EKG Interpretation ? ?Date/Time:  Monday October 06 2021 16:08:40 EDT ?Ventricular Rate:  62 ?PR Interval:    ?QRS Duration: 96 ?QT Interval:  410 ?QTC Calculation: 416 ?R Axis:   93 ?Text Interpretation: Atrial fibrillation with occasional Premature ventricular complexes Rightward axis Septal infarct , age undetermined Abnormal ECG increased ectopy from prior 8/17 increased ectopy from prior 8/17  Atrial fibrillation Confirmed by Aletta Edouard [id number]) on 10/06/2021 4:57:21 PM ? ?Radiology ?MR BRAIN WO CONTRAST ? ?Result Date: 10/06/2021 ?CLINICAL DATA:  Neuro deficit with acute stroke suspected EXAM: MRI HEAD WITHOUT CONTRAST TECHNIQUE: Multiplanar, multiecho pulse sequences of the brain and surrounding structures were obtained without intravenous contrast. COMPARISON:  None. FINDINGS: Brain: No acute infarction, hemorrhage, hydrocephalus, extra-axial collection or mass lesion. Moderate size remote left MCA branch infarcts in the frontal and frontal parietal regions. Brain atrophy especially notable at the vertex. Confluent chronic small vessel ischemia in  the hemispheric white matter. Small remote right cerebellar infarct. Vascular: Right V4 segment loss of flow void, although reconstituted by the distal V4 segment and right PICA based on coronal T2 weighted imaging. Skull and upper cervical spine: Normal marrow signal. Sinuses/Orbits: Bilateral cataract resection IMPRESSION: 1. No acute finding. 2. Chronic small vessel disease, brain atrophy, and remote left MCA branch infarcts. Electronically Signed   By: Jorje Guild M.D.   On: 10/06/2021 18:22   ? ?Procedures ?Procedures  ? ? ?Medications Ordered in ED ?Medications - No data to display ? ?ED Course/ Medical Decision Making/ A&P ?  ?                        ?Medical Decision Making ?Amount and/or Complexity of Data Reviewed ?Labs: ordered. ?Radiology: ordered. ? ?This patient complains of leg numbness on the left below the knee; this involves an extensive number of treatment ?Options and is a complaint that carries with it a high risk of complications and ?morbidity. The differential includes stroke, peripheral nerve, radiculopathy, metabolic derangement, ischemic limb ? ?I ordered, reviewed and interpreted labs, which included CBC with normal white count, hemoglobin low stable from priors, platelets low similar to priors, chemistries with chronic CKD ?I ordered imaging studies which included  MRI brain and I independently ?   visualized and interpreted imaging which showed no acute findings ?Additional history obtained from patient's family member ?Previous records obtained and reviewed in epic, la

## 2021-10-07 ENCOUNTER — Telehealth: Payer: Self-pay

## 2021-10-07 NOTE — Telephone Encounter (Signed)
Transition Care Management Follow-up Telephone Call ?Date of discharge and from where: [facility] ED on 10/06/21 ?How have you been since you were released from the hospital? Pt states his leg has since gotten better; has normal feeling in his leg at this time. ?Any questions or concerns? No ? ?Items Reviewed: ?Did the pt receive and understand the discharge instructions provided? Yes  ?Medications obtained and verified? N/a ?Any new allergies since your discharge? No  ?Dietary orders reviewed? No ?Do you have support at home? Yes  ? ?Home Care and Equipment/Supplies: ?Were home health services ordered? not applicable ? ? ?Functional Questionnaire: (I = Independent and D = Dependent) ?ADLs: I ? ?Bathing/Dressing- I ? ?Meal Prep- I ? ?Eating- I ? ?Maintaining continence- I ? ?Transferring/Ambulation- I ? ?Managing Meds- I ? ?Follow up appointments reviewed: ? ?PCP Hospital f/u appt confirmed? No  Pt states since he's better he does not need appt. Pt advised to call to schedule appt if symptoms return. Scheduled 6 month f/u ?[facility] f/u appt confirmed?  Na/   ?Are transportation arrangements needed? No  ?If their condition worsens, is the pt aware to call PCP or go to the Emergency Dept.? Yes ?Was the patient provided with contact information for the PCP's office or ED? Yes ?Was to pt encouraged to call back with questions or concerns? Yes  ?

## 2021-10-24 ENCOUNTER — Telehealth: Payer: Self-pay | Admitting: Physician Assistant

## 2021-10-24 NOTE — Telephone Encounter (Signed)
Spoke with [name] who will call and have pt weigh and call us back.  ?

## 2021-10-24 NOTE — Telephone Encounter (Signed)
Called [name] for an update on pt weight. She states that pt is down 2 pounds and denies swelling at this time. She ask that we disregard prior msg.  ?

## 2021-10-24 NOTE — Telephone Encounter (Signed)
Pt c/o swelling: STAT is pt has developed SOB within 24 hours ? ?If swelling, where is the swelling located? His niece called stating he is having some swelling in both legs, more towards his feet and ankles.   ? ?How much weight have you gained and in what time span? Hasn't checked his weight.  She doesn't think he has gained any weight ? ?Have you gained 3 pounds in a day or 5 pounds in a week?  ? ?Do you have a log of your daily weights (if so, list)?  ? ?Are you currently taking a fluid pill? no ? ?Are you currently SOB? No  ? ?Have you traveled recently? No ? ? She states Selinda Eon had in the past put him on furosemide (LASIX) 20 MG tablet for 3 days, she is wondering if she can prescribed that for him again.   ?

## 2021-10-27 ENCOUNTER — Encounter: Payer: Medicare Other | Admitting: Surgery

## 2021-11-11 ENCOUNTER — Other Ambulatory Visit: Payer: Self-pay | Admitting: Family Medicine

## 2021-11-11 NOTE — Telephone Encounter (Signed)
I tried to send this several times electronically and for some reason this would not go through. ?

## 2021-11-12 NOTE — Telephone Encounter (Signed)
Pt calling today for an update on this script. Advised that there was an issue with submitting the script electronically. Pls advise patient on next steps ?

## 2021-11-12 NOTE — Telephone Encounter (Signed)
Pt informed that there is an issue sending rx electronically and informed rx will be sent once issue is resolved. Pt was offered printed rx but declined at this time  ?

## 2021-11-12 NOTE — Telephone Encounter (Signed)
Patient called to follow up on prescription being sent. I let patient know that prescription would be sent when the issue was resolved. Patient is stating that he is having panic attacks and he wants to know if it would be today or tomorrow before it is sent in. I reiterated that [facility] had told him that he will send it, so it will be sent as soon as the electronic refill system issue is resolved. ? ? ? ? ? ? ? ?FYI  ?

## 2021-11-13 MED ORDER — ALPRAZOLAM 1 MG PO TABS: 1.0000 mg | ORAL_TABLET | Freq: Three times a day (TID) | ORAL | 0 refills | Status: DC

## 2021-11-13 NOTE — Addendum Note (Signed)
Addended by: Apolinar Junes on: 11/13/2021 09:02 AM ? ? Modules accepted: Orders ? ?

## 2021-11-13 NOTE — Addendum Note (Signed)
Addended by: Nilda Riggs on: 11/13/2021 08:39 AM ? ? Modules accepted: Orders ? ?

## 2021-11-13 NOTE — Telephone Encounter (Signed)
Please see above message!

## 2021-11-24 ENCOUNTER — Telehealth: Payer: Self-pay | Admitting: Family Medicine

## 2021-11-24 ENCOUNTER — Ambulatory Visit (INDEPENDENT_AMBULATORY_CARE_PROVIDER_SITE_OTHER): Payer: Medicare Other | Admitting: Family Medicine

## 2021-11-24 ENCOUNTER — Encounter: Payer: Self-pay | Admitting: Family Medicine

## 2021-11-24 VITALS — BP 122/60 | HR 70 | Temp 97.6°F | Ht 72.0 in | Wt 198.5 lb

## 2021-11-24 DIAGNOSIS — Z8679 Personal history of other diseases of the circulatory system: Secondary | ICD-10-CM

## 2021-11-24 DIAGNOSIS — F411 Generalized anxiety disorder: Secondary | ICD-10-CM

## 2021-11-24 DIAGNOSIS — L570 Actinic keratosis: Secondary | ICD-10-CM | POA: Diagnosis not present

## 2021-11-24 DIAGNOSIS — I10 Essential (primary) hypertension: Secondary | ICD-10-CM

## 2021-11-24 DIAGNOSIS — R21 Rash and other nonspecific skin eruption: Secondary | ICD-10-CM | POA: Diagnosis not present

## 2021-11-24 MED ORDER — NYSTATIN-TRIAMCINOLONE 100000-0.1 UNIT/GM-% EX OINT: 1.0000 "application " | TOPICAL_OINTMENT | Freq: Two times a day (BID) | CUTANEOUS | 2 refills | Status: DC

## 2021-11-24 MED ORDER — APIXABAN 5 MG PO TABS
5.0000 mg | ORAL_TABLET | Freq: Two times a day (BID) | ORAL | 0 refills | Status: DC
Start: 2021-11-24 — End: 2022-07-17

## 2021-11-24 NOTE — Telephone Encounter (Signed)
Randall Lara with [facility] given ok to change rx and pt informed  ?

## 2021-11-24 NOTE — Telephone Encounter (Signed)
[name] from [facility] call and stated the insurance company will not pay for pt medication together and she want to know if she can separate the Nystatin from Triamcinolone and want a call back at [phone] because they will pay if it is separated. ?

## 2021-11-24 NOTE — Progress Notes (Signed)
? ?Established Patient Office Visit ? ?Subjective   ?Patient ID: Randall Lara, male    DOB: Nov 02, 1929  Age: 88 y.o. MRN: [id number] ? ?Chief Complaint  ?Patient presents with  ? Follow-up  ? ? ?HPI ? ?[place] ?Randall Lara is seen for medical follow-up.  His medical problems include history of aortic stenosis, hypertension, GERD, chronic anxiety, history of atrial fibrillation.  He has been somewhat less active in recent years but no recent falls.  Drives very infrequently at this time.  His wife has advancing dementia and he spends most of his time caring for her. ? ?He does have longstanding history of recurrent depression and feels like his depression is stable currently on regimen including Wellbutrin, Remeron, low-dose Abilify.  He has been on alprazolam 3 times daily for many years.  We tried taking off a couple times and he did not tolerate well. ? ?He requests refill of triamcinolone nystatin cream which he has used for recurrent groin rash in the past with good success. ? ?He has some scaly areas dorsum left ear.  Denies any recent ulceration. ? ?History of atrial fibrillation.  He remains on Eliquis.  He has relatively slow heart rate at baseline with normal heart rate around 48.  Denies any recent dizziness or any syncope or presyncopal symptoms.  Requesting samples of Eliquis today.  He states he does take this regularly ? ?Past Medical History:  ?Diagnosis Date  ? Anxiety   ? Arthritis   ? Atrial fibrillation ([place])   ? Depression   ? Dysrhythmia   ? atrial fib  ? GERD (gastroesophageal reflux disease)   ? no meds needed- diet controlled  ? Heart palpitations   ? Hypertension   ? Joint pain   ? Obesity   ? ?Past Surgical History:  ?Procedure Laterality Date  ? CATARACT EXTRACTION Left   ? CATARACT EXTRACTION W/PHACO Right 03/05/2016  ? Procedure: CATARACT EXTRACTION PHACO AND INTRAOCULAR LENS PLACEMENT ; CDE:  11.47;  Surgeon: Tonny Branch, MD;  [facility]: [facility];  Service: Ophthalmology;  Laterality: Right;  ?  ESOPHAGOGASTRODUODENOSCOPY  03/29/2002  ? PXT:GGYIRS esophagus, stomach, and duodenum through the second  portion.  ? hernia repair Bilateral   ? Inguinal  ? RIGHT/LEFT HEART CATH AND CORONARY ANGIOGRAPHY N/A 09/19/2021  ? Procedure: RIGHT/LEFT HEART CATH AND CORONARY ANGIOGRAPHY;  Surgeon: Burnell Blanks, MD;  Location: [facility];  Service: Cardiovascular;  Laterality: N/A;  ? TOTAL KNEE ARTHROPLASTY Right 11/13/2015  ? Procedure: TOTAL KNEE ARTHROPLASTY;  Surgeon: Latanya Maudlin, MD;  Location: [facility];  Service: Orthopedics;  Laterality: Right;  ? ? reports that he quit smoking about 46 years ago. His smoking use included cigarettes. He started smoking about 66 years ago. He has a 20.00 pack-year smoking history. He has never used smokeless tobacco. He reports that he does not currently use alcohol. He reports that he does not use drugs. ?family history includes Heart attack in his brother; Hypertension in his mother and sister; Stroke in his father, mother, and sister. ?Allergies  ?Allergen Reactions  ? Ferrous Sulfate Hives and Swelling  ?  Patient doesn't recall.  ? Sulfa Antibiotics Hives and Swelling  ?  Patient doesn't recall.  ? ? ?Review of Systems  ?Constitutional:  Negative for malaise/fatigue.  ?Respiratory:  Negative for cough and shortness of breath.   ?Cardiovascular:  Negative for chest pain and palpitations.  ?Genitourinary:  Negative for dysuria.  ?Neurological:  Negative for dizziness, speech change and focal  weakness.  ?Psychiatric/Behavioral:  Negative for depression.   ? ?  ?Objective:  ?  ? ?BP 122/60 (BP Location: Left Arm, Patient Position: Sitting, Cuff Size: Normal)   Pulse 70   Temp 97.6 ?F (36.4 ?C) (Oral)   Ht 6' (1.829 m)   Wt 198 lb 8 oz (90 kg)   SpO2 98%   BMI 26.92 kg/m?  ?Wt Readings from Last 3 Encounters:  ?11/24/21 198 lb 8 oz (90 kg)  ?10/06/21 198 lb (89.8 kg)  ?09/30/21 198 lb 9.6 oz (90.1 kg)  ? ?  ? ?Physical Exam ?Vitals reviewed.  ?Cardiovascular:   ?   Comments: Regular rhythm with rate around 48 ?Pulmonary:  ?   Effort: Pulmonary effort is normal.  ?   Breath sounds: Normal breath sounds. No wheezing or rales.  ?Musculoskeletal:  ?   Cervical back: Neck supple.  ?   Right lower leg: No edema.  ?   Left lower leg: No edema.  ?Skin: ?   Comments: Whitish scaly hyperkeratotic lesion dorsum left ear.  No ulceration.  No nodular changes.  ? ? ? ?No results found for any visits on 11/24/21. ? ?Last CBC ?Lab Results  ?Component Value Date  ? WBC 4.7 10/06/2021  ? HGB 12.6 (L) 10/06/2021  ? HCT 38.9 (L) 10/06/2021  ? MCV 97.7 10/06/2021  ? MCH 31.7 10/06/2021  ? RDW 14.1 10/06/2021  ? PLT 106 (L) 10/06/2021  ? ?Last metabolic panel ?Lab Results  ?Component Value Date  ? GLUCOSE 96 10/06/2021  ? NA 142 10/06/2021  ? K 3.8 10/06/2021  ? CL 106 10/06/2021  ? CO2 29 10/06/2021  ? BUN 28 (H) 10/06/2021  ? CREATININE 1.30 (H) 10/06/2021  ? GFRNONAA 52 (L) 10/06/2021  ? CALCIUM 9.5 10/06/2021  ? PROT 6.4 08/02/2019  ? ALBUMIN 3.9 08/02/2019  ? BILITOT 0.7 08/02/2019  ? ALKPHOS 75 08/02/2019  ? AST 27 08/02/2019  ? ALT 18 08/02/2019  ? ANIONGAP 7 10/06/2021  ? ?  ? ?The ASCVD Risk score (Arnett DK, et al., 2019) failed to calculate for the following reasons: ?  The 2019 ASCVD risk score is only valid for ages 98 to 20 ? ?  ?Assessment & Plan:  ? ?#1 history of recurrent depression.  Currently stable on regimen of bupropion and Remeron.  He also takes low-dose Abilify at night.  Continue current regimen. ? ?#2 longstanding history of severe anxiety.  We have multiple times try to taper him off alprazolam without success.  We discussed many times increased risk of falls associated with benzodiazepines.  He states his gait is steady.  No recent falls. ? ?#3 history of recurrent rash groin region.  This has been responsive to nystatin triamcinolone ointment in the past.  This was refilled ? ?#4 history of atrial fibrillation.  Patient on chronic Eliquis.  He is requesting  samples and these were given.  He did have recent CBC and basic metabolic panel which were reviewed and stable. ? ?#5 actinic keratoses involving dorsum of the left ear.  Discussed risk and benefits of treatment with liquid nitrogen including risk of pain, blistering, low risk of infection.  Patient consented.  Actinic keratosis dorsum left ear treated without difficulty and patient tolerated well ? ?#6 hypertension stable and at goal.  Continue benazepril HCTZ ? ?Return in about 6 months (around 05/27/2022).  ? ? ?Carolann Littler, MD ? ?

## 2021-12-01 ENCOUNTER — Telehealth: Payer: Self-pay | Admitting: Family Medicine

## 2021-12-01 NOTE — Telephone Encounter (Signed)
Pt's niece stated he has not received recent x-ray, Appt has been scheduled for f/u

## 2021-12-01 NOTE — Telephone Encounter (Signed)
Pt requesting information on someone you would recommend to perform a hip replacement. Pt not sure who to go to and seeking advice. Pt requests that you relay this info to his niece whose contact info is provider

## 2021-12-09 ENCOUNTER — Encounter: Payer: Self-pay | Admitting: Family Medicine

## 2021-12-09 ENCOUNTER — Ambulatory Visit (INDEPENDENT_AMBULATORY_CARE_PROVIDER_SITE_OTHER): Payer: Medicare Other | Admitting: Family Medicine

## 2021-12-09 VITALS — BP 150/70 | HR 50 | Temp 97.4°F | Ht 72.0 in | Wt 193.3 lb

## 2021-12-09 DIAGNOSIS — M545 Low back pain, unspecified: Secondary | ICD-10-CM | POA: Diagnosis not present

## 2021-12-09 NOTE — Progress Notes (Signed)
Established Patient Office Visit  Subjective   Patient ID: Randall Lara, male    DOB: 11/29/1929  Age: 88 y.o. MRN: [id number]  Chief Complaint  Patient presents with   Referral   Back Pain    Patient complains of back pain, x2 weeks, Tried Tylenol with little relief     HPI   We had received a call last week for information regarding who we would recommend for a "hip replacement ".  We had not evaluated the patient for any recent hip pain.  It turns out he has really more left lower lumbar back pain.  His wife has dementia and he has to do a lot of physical things to care for her.  Frequently has to reposition her and lift her.  He currently has aide coming out daily to help.  He thinks he may have strained things couple weeks ago.  Pain left lower lumbar area.  No radiculitis symptoms.  No anterior hip pain.  Took some Tylenol and states pain is almost resolved at this time compared with when he set up the appointment.  No fevers or chills.  No lower extremity numbness or weakness.  Past Medical History:  Diagnosis Date   Anxiety    Arthritis    Atrial fibrillation (HCC)    Depression    Dysrhythmia    atrial fib   GERD (gastroesophageal reflux disease)    no meds needed- diet controlled   Heart palpitations    Hypertension    Joint pain    Obesity    Past Surgical History:  Procedure Laterality Date   CATARACT EXTRACTION Left    CATARACT EXTRACTION W/PHACO Right 03/05/2016   Procedure: CATARACT EXTRACTION PHACO AND INTRAOCULAR LENS PLACEMENT ; CDE:  11.47;  Surgeon: Tonny Branch, MD;  [facility];  Service: Ophthalmology;  Laterality: Right;   ESOPHAGOGASTRODUODENOSCOPY  03/29/2002   XHB:ZJIRCV esophagus, stomach, and duodenum through the second  portion.   hernia repair Bilateral    Inguinal   RIGHT/LEFT HEART CATH AND CORONARY ANGIOGRAPHY N/A 09/19/2021   Procedure: RIGHT/LEFT HEART CATH AND CORONARY ANGIOGRAPHY;  Surgeon: Burnell Blanks, MD;  Location: [facility] [place] [facility];  Service: Cardiovascular;  Laterality: N/A;   TOTAL KNEE ARTHROPLASTY Right 11/13/2015   Procedure: TOTAL KNEE ARTHROPLASTY;  Surgeon: Latanya Maudlin, MD;  [facility];  Service: Orthopedics;  Laterality: Right;    reports that he quit smoking about 46 years ago. His smoking use included cigarettes. He started smoking about 66 years ago. He has a 20.00 pack-year smoking history. He has never used smokeless tobacco. He reports that he does not currently use alcohol. He reports that he does not use drugs. family history includes Heart attack in his brother; Hypertension in his mother and sister; Stroke in his father, mother, and sister. Allergies  Allergen Reactions   Ferrous Sulfate Hives and Swelling    Patient doesn't recall.   Sulfa Antibiotics Hives and Swelling    Patient doesn't recall.    Review of Systems  Constitutional:  Negative for chills and fever.  Respiratory:  Negative for shortness of breath.   Cardiovascular:  Negative for chest pain.  Genitourinary:  Negative for dysuria.  Musculoskeletal:  Positive for back pain.     Objective:     BP (!) 150/70 (BP Location: Left Arm, Patient Position: Sitting, Cuff Size: Normal)   Pulse (!) 50   Temp (!) 97.4 F (36.3 C) (Oral)   Ht 6' (1.829  m)   Wt 193 lb 4.8 oz (87.7 kg)   SpO2 98%   BMI 26.22 kg/m    Physical Exam Vitals reviewed.  Constitutional:      Appearance: Normal appearance.  Musculoskeletal:     Comments: No spinal tenderness.  Straight leg raise are negative bilaterally.  Fair range of motion left hip.  No lateral hip tenderness.  Neurological:     Mental Status: He is alert.     Comments: Full strength lower extremity with plantarflexion, dorsiflexion.  Trace reflex knee and ankle bilaterally.     No results found for any visits on 12/09/21.    The ASCVD Risk score (Arnett DK, et al., 2019) failed to calculate for the following reasons:   The 2019 ASCVD risk score is only  valid for ages 49 to 53    Assessment & Plan:   Problem List Items Addressed This Visit   None Visit Diagnoses     Acute left-sided low back pain without sciatica    -  Primary     Pain almost resolved at this time.  Suspect musculoskeletal strain related to recent lifting of wife.  He may have some arthritis in his hip but doubt this is the major source of his recent pain.  Reminder to avoid nonsteroidals with his chronic anticoagulation with Eliquis.  Continue Tylenol as needed.  Consider physical therapy for any recurrent symptoms.  No follow-ups on file.    Carolann Littler, MD

## 2021-12-09 NOTE — Progress Notes (Signed)
Cardiology Office Note    Date:  12/22/2021   ID:  Randall Lara, DOB 1929/09/21, MRN [id number]   PCP:  [name], MD   [place]  Cardiologist:  Kate Sable, MD (Inactive)   Advanced Practice Provider:  No care team member to display Electrophysiologist:  None   [phone]   No chief complaint on file.   History of Present Illness:  Randall Lara is a 88 y.o. male with history of permanent Afib on eliquis, no AV nodal blocking agents, HTN, dementia, echo 2011 EF 65-70%, mild AI, low risk NST 06/2013.   Patient went to [facility] 08/31/21 with near syncope, and advised admission for CHF but patient declined because of wife with severe dementia. Was not hypotensive and afib rate 60/m. CXR ? Free air in adbomen and CT abd -no free air. Felt to have CHF with elevated [id number]. echocardiogram which showed LVEF 60 to 65%, moderate LVH and moderate left atrial enlargement. The aortic valve was noted to be severely stenotic although the gradient was 26 mmHg, the dimensionless index was 0.24.   Cardiac cath 09/19/2021 showed 70% ostial to proximal LAD otherwise nonobstructive disease see below for details.  He had severe aortic stenosis peak to peak gradient 10 to 15 mmHg by cath.  Medical management was recommended for his CAD and he was to continue work-up for TAVR but patient declined.  Monitor showed 100% underlying atrial fibrillation 2 ventricular tachycardia runs occurred the fastest lasting 18 beats rate of 167 bpm.  He had some slow A-fib ranging from 35 to 96 bpm.  Patient saw Dr. Lovena Le who offered him ILR insertion versus watchful waiting and patient chose watchful waiting.  Patient comes in with his son. He didn't bring his meds but says they haven't changed. He denies chest pain, dyspnea, dizziness or presyncope. Still taking care of his wife with dementia. Doesn't do much of anything else. Wants to extend his appointments out beyond 3  months.    Past Medical History:  Diagnosis Date   Anxiety    Arthritis    Atrial fibrillation (HCC)    Depression    Dysrhythmia    atrial fib   GERD (gastroesophageal reflux disease)    no meds needed- diet controlled   Heart palpitations    Hypertension    Joint pain    Obesity     Past Surgical History:  Procedure Laterality Date   CATARACT EXTRACTION Left    CATARACT EXTRACTION W/PHACO Right 03/05/2016   Procedure: CATARACT EXTRACTION PHACO AND INTRAOCULAR LENS PLACEMENT ; CDE:  11.47;  Surgeon: Tonny Branch, MD;  Location: [facility];  Service: Ophthalmology;  Laterality: Right;   ESOPHAGOGASTRODUODENOSCOPY  03/29/2002   SNK:NLZJQB esophagus, stomach, and duodenum through the second  portion.   hernia repair Bilateral    Inguinal   RIGHT/LEFT HEART CATH AND CORONARY ANGIOGRAPHY N/A 09/19/2021   Procedure: RIGHT/LEFT HEART CATH AND CORONARY ANGIOGRAPHY;  Surgeon: Burnell Blanks, MD;  Location: [place] [facility];  Service: Cardiovascular;  Laterality: N/A;   TOTAL KNEE ARTHROPLASTY Right 11/13/2015   Procedure: TOTAL KNEE ARTHROPLASTY;  Surgeon: Latanya Maudlin, MD;  [facility];  Service: Orthopedics;  Laterality: Right;    Current Medications: Current Meds  Medication Sig   ALPRAZolam (XANAX) 1 MG tablet TAKE ONE TABLET BY MOUTH THREE TIMES DAILY   apixaban (ELIQUIS) 5 MG TABS tablet Take 1 tablet (5 mg total) by mouth 2 (two) times daily.  Lot: [id number] EXP: 09/2023   ARIPiprazole (ABILIFY) 2 MG tablet Take 1 tablet (2 mg total) by mouth daily.   benazepril-hydrochlorthiazide (LOTENSIN HCT) 20-12.5 MG tablet Take 1 tablet by mouth daily.   buPROPion (WELLBUTRIN XL) 300 MG 24 hr tablet Take 1 tablet (300 mg total) by mouth daily.   ELIQUIS 5 MG TABS tablet TAKE ONE TABLET BY MOUTH TWICE DAILY   furosemide (LASIX) 20 MG tablet Take 1 tablet (20 mg total) by mouth as directed. X 3 days only, then only as advised by physician.   mirtazapine (REMERON SOL-TAB) 30 MG  disintegrating tablet DISSOLVE ONE TABLET BY MOUTH AT BEDTIME (Patient taking differently: Take 30 mg by mouth at bedtime. DISSOLVE ONE TABLET BY MOUTH AT BEDTIME)   nitroGLYCERIN (NITROSTAT) 0.4 MG SL tablet DISSOLVE ONE TABLET UNDER TONGUE EVERY 5 MINUTES UP TO 3 DOSES AS NEEDED FOR CHEST PAIN. DO NOT EXCEED A TOTAL OF 3 DOSES IN 15 MINUTES.   nystatin-triamcinolone ointment (MYCOLOG) Apply 1 application. topically 2 (two) times daily.   polyethylene glycol (MIRALAX / GLYCOLAX) 17 g packet Take 17 g by mouth daily.   psyllium (METAMUCIL) 58.6 % powder Take 1 packet by mouth daily.     Allergies:   Ferrous sulfate and Sulfa antibiotics   Social History   Socioeconomic History   Marital status: Married    Spouse name: Not on file   Number of children: 1   Years of education: Not on file   Highest education level: Not on file  Occupational History   Occupation: Retired from [employer]   Smoking status: Former    Packs/day: 1.00    Years: 20.00    Total pack years: 20.00    Types: Cigarettes    Start date: 08/07/1955    Quit date: 08/07/1975    Years since quitting: 46.4   Smokeless tobacco: Never  Vaping Use   Vaping Use: Never used  Substance and Sexual Activity   Alcohol use: Not Currently    Comment: occassional   Drug use: No   Sexual activity: Yes    Birth control/protection: None  Other Topics Concern   Not on file  Social History Narrative   Not on file   Social Determinants of Health   Financial Resource Strain: Low Risk  (10/02/2020)   Overall Financial Resource Strain (CARDIA)    Difficulty of Paying Living Expenses: Not hard at all  Food Insecurity: No Food Insecurity (10/02/2020)   Hunger Vital Sign    Worried About Running Out of Food in the Last Year: Never true    [place] in the Last Year: Never true  Transportation Needs: No Transportation Needs (10/02/2020)   PRAPARE - Hydrologist (Medical):  No    Lack of Transportation (Non-Medical): No  Physical Activity: Inactive (10/02/2020)   Exercise Vital Sign    Days of Exercise per Week: 0 days    Minutes of Exercise per Session: 0 min  Stress: No Stress Concern Present (10/02/2020)   [place]    Feeling of Stress : Not at all  Social Connections: Moderately Isolated (10/02/2020)   Social Connection and Isolation Panel [NHANES]    Frequency of Communication with Friends and Family: More than three times a week    Frequency of Social Gatherings with Friends and Family: Three times a week    Attends Religious Services: Never  Active Member of Clubs or Organizations: No    Attends Archivist Meetings: Never    Marital Status: Married     Family History:  The patient's  family history includes Heart attack in his brother; Hypertension in his mother and sister; Stroke in his father, mother, and sister.   ROS:   Please see the history of present illness.    ROS All other systems reviewed and are negative.   PHYSICAL EXAM:   VS:  BP 126/74   Pulse 62   Ht 6' (1.829 m)   Wt 195 lb 12.8 oz (88.8 kg)   SpO2 95%   BMI 26.56 kg/m   Physical Exam  GEN: Well nourished, well developed, in no acute distress  Neck: no JVD, carotid bruits, or masses Cardiac:RRR; 4/6 systolic murmur LSB Respiratory:  clear to auscultation bilaterally, normal work of breathing GI: soft, nontender, nondistended, + BS Ext: without cyanosis, clubbing, or edema, Good distal pulses bilaterally Neuro:  Alert and Oriented x 3, Psych: euthymic mood, full affect  Wt Readings from Last 3 Encounters:  12/22/21 195 lb 12.8 oz (88.8 kg)  12/09/21 193 lb 4.8 oz (87.7 kg)  11/24/21 198 lb 8 oz (90 kg)      Studies/Labs Reviewed:   EKG:  EKG is not ordered today.     Recent Labs: 10/06/2021: BUN 28; Creatinine, Ser 1.30; Hemoglobin 12.6; Platelets 106; Potassium 3.8; Sodium 142    Lipid Panel No results found for: "CHOL", "TRIG", "HDL", "CHOLHDL", "VLDL", "LDLCALC", "LDLDIRECT"  Additional studies/ records that were reviewed today include:  Monitor 3/20230500)   2 Ventricular Tachycardia runs occurred, the run with the fastest interval lasting 18 beats with a max rate of 167 bpm (avg 128 bpm); the run with the fastest interval was also the longest. Atrial Fibrillation occurred continuously (100% burden), ranging  from 35-96 bpm (avg of 50 bpm). Isolated VEs were frequent (12.9%, 65849), VE Couplets were rare (<1.0%, 1929), and VE Triplets were rare (<1.0%, 56). Ventricular Bigeminy and Trigeminy were present. MD notification criteria for Slow Atrial Fibrillation  met - report posted prior to notification per account request (BN).   100% afib underling. PVC's were noted, including 2 runs of VT - the longest of 18 beats up to 167 bpm (~8 seconds). Some slow afib in the 30's was noted as well.   Pixie Casino, MD, [facility], [place]   Cardiac cath 09/2021  Mid RCA lesion is 30% stenosed.   Prox RCA lesion is 20% stenosed.   RPDA lesion is 60% stenosed.   Prox Cx to Mid Cx lesion is 30% stenosed.   Mid LAD lesion is 40% stenosed.   Ost LAD to Prox LAD lesion is 70% stenosed.   Moderate to severe, heavily calcified proximal LAD stenosis followed by an aneurysmal segment in the mid LAD.  Mild non-obstructive disease in the Circumflex Heavily calcified, large dominant RCA with mild non-obstructive disease in the proximal and mid vessel. The PDA has moderate ostial stenosis.  Severe aortic stenosis by echo. Peak to peak gradient 10-15 mmHg by cath today.    Recommendations: His LAD is heavily calcified with moderately severe proximal stenosis. He has no angina. I favor medical management of his CAD at this time. Will continue workup for TAVR.     Risk Assessment/Calculations:    CHA2DS2-VASc Score = 5   This indicates a 7.2% annual risk of stroke. The patient's score  is based upon: CHF History: 1 HTN History: 1  Diabetes History: 0 Stroke History: 0 Vascular Disease History: 1 Age Score: 2 Gender Score: 0        ASSESSMENT:    1. Severe aortic stenosis   2. Coronary artery disease involving native coronary artery of native heart without angina pectoris   3. Permanent atrial fibrillation ([place])   4. Chronic diastolic CHF (congestive heart failure) (HCC)      PLAN:  In order of problems listed above:  Severe aortic stenosis peak to peak gradient 10 to 15 mmHg on cath.  Patient declined TAVR. Denies any dizziness, syncope or chest pain  CAD 70% ost-prox LAD mild nonobstructive disease elsewhere. No chest pain, continue Medical therapy.  Permanent atrial fibrillation with some slow ventricular rates in the 30s as well as NSVT with history of near syncope.  On Eliquis no AV nodal agents. No bleeding problems. Will check labs today and have f/u in 6 months in [place]  Diastolic CHF compensated    Shared Decision Making/Informed Consent        Medication Adjustments/Labs and Tests Ordered: Current medicines are reviewed at length with the patient today.  Concerns regarding medicines are outlined above.  Medication changes, Labs and Tests ordered today are listed in the Patient Instructions below. Patient Instructions  Medication Instructions:  Your physician recommends that you continue on your current medications as directed. Please refer to the Current Medication list given to you today.  *If you need a refill on your cardiac medications before your next appointment, please call your pharmacy*   Lab Work: TODAY: BMET, CBC If you have labs (blood work) drawn today and your tests are completely normal, you will receive your results only by: [place] (if you have MyChart) OR A paper copy in the mail If you have any lab test that is abnormal or we need to change your treatment, we will call you to review the  results.   Testing/Procedures: NONE ORDERED    Follow-Up: At [facility], you and your health needs are our priority.  As part of our continuing mission to provide you with exceptional heart care, we have created designated Provider Care Teams.  These Care Teams include your primary Cardiologist (physician) and Advanced Practice Providers (APPs -  Physician Assistants and Nurse Practitioners) who all work together to provide you with the care you need, when you need it.  We recommend signing up for the patient portal called "MyChart".  Sign up information is provided on this After Visit Summary.  MyChart is used to connect with patients for Virtual Visits (Telemedicine).  Patients are able to view lab/test results, encounter notes, upcoming appointments, etc.  Non-urgent messages can be sent to your provider as well.   To learn more about what you can do with MyChart, go to [email or web address].    Your next appointment:   6 month(s)  The format for your next appointment:   In Person  Provider:   You may see a doctor in [place] or the following Advanced Practice Provider on your designated Care Team:   Katina Dung, [facility]   Important Information About Sugar         Signed, Ermalinda Barrios, PA-C  12/22/2021 11:20 AM    [facility] [address], [place], [place]  [address] Phone: [phone]; Fax: [phone]

## 2021-12-11 ENCOUNTER — Other Ambulatory Visit: Payer: Self-pay | Admitting: Family Medicine

## 2021-12-22 ENCOUNTER — Encounter: Payer: Self-pay | Admitting: Physician Assistant

## 2021-12-22 ENCOUNTER — Ambulatory Visit: Payer: Medicare Other | Admitting: Physician Assistant

## 2021-12-22 VITALS — BP 126/74 | HR 62 | Ht 72.0 in | Wt 195.8 lb

## 2021-12-22 DIAGNOSIS — I4821 Permanent atrial fibrillation: Secondary | ICD-10-CM | POA: Diagnosis not present

## 2021-12-22 DIAGNOSIS — I2510 Atherosclerotic heart disease of native coronary artery without angina pectoris: Secondary | ICD-10-CM | POA: Diagnosis not present

## 2021-12-22 DIAGNOSIS — I5032 Chronic diastolic (congestive) heart failure: Secondary | ICD-10-CM

## 2021-12-22 DIAGNOSIS — I350 Nonrheumatic aortic (valve) stenosis: Secondary | ICD-10-CM | POA: Diagnosis not present

## 2021-12-22 NOTE — Patient Instructions (Addendum)
Medication Instructions:  Your physician recommends that you continue on your current medications as directed. Please refer to the Current Medication list given to you today.  *If you need a refill on your cardiac medications before your next appointment, please call your pharmacy*   Lab Work: TODAY: BMET, CBC If you have labs (blood work) drawn today and your tests are completely normal, you will receive your results only by: [place] (if you have MyChart) OR A paper copy in the mail If you have any lab test that is abnormal or we need to change your treatment, we will call you to review the results.   Testing/Procedures: NONE ORDERED    Follow-Up: At [facility], you and your health needs are our priority.  As part of our continuing mission to provide you with exceptional heart care, we have created designated Provider Care Teams.  These Care Teams include your primary Cardiologist (physician) and Advanced Practice Providers (APPs -  Physician Assistants and Nurse Practitioners) who all work together to provide you with the care you need, when you need it.  We recommend signing up for the patient portal called "MyChart".  Sign up information is provided on this After Visit Summary.  MyChart is used to connect with patients for Virtual Visits (Telemedicine).  Patients are able to view lab/test results, encounter notes, upcoming appointments, etc.  Non-urgent messages can be sent to your provider as well.   To learn more about what you can do with MyChart, go to [email or web address].    Your next appointment:   6 month(s)  The format for your next appointment:   In Person  Provider:   You may see a doctor in [place] or the following Advanced Practice Provider on your designated Care Team:   Katina Dung, [facility]   Important Information About [place]

## 2022-02-18 ENCOUNTER — Other Ambulatory Visit: Payer: Self-pay

## 2022-02-18 MED ORDER — BUPROPION HCL ER (XL) 300 MG PO TB24: 300.0000 mg | ORAL_TABLET | Freq: Every day | ORAL | 1 refills | Status: DC

## 2022-02-18 MED ORDER — MIRTAZAPINE 30 MG PO TBDP: ORAL_TABLET | ORAL | 1 refills | Status: DC

## 2022-03-12 ENCOUNTER — Other Ambulatory Visit: Payer: Self-pay | Admitting: Family Medicine

## 2022-04-20 ENCOUNTER — Telehealth: Payer: Self-pay | Admitting: *Deleted

## 2022-04-20 ENCOUNTER — Encounter: Payer: Self-pay | Admitting: *Deleted

## 2022-04-20 NOTE — Patient Instructions (Signed)
Visit Information  Thank you for taking time to visit with me today. Please don't hesitate to contact me if I can be of assistance to you.   Following are the goals we discussed today:   Goals Addressed             This Visit's Progress    COMPLETED: No needs       Care Coordination Interventions: Advised patient to to scheduled the pt's AWV with his provider's office  Reviewed medications with patient and discussed adherence and educated accordingly Reviewed scheduled/upcoming provider appointments including pending appointments and verified sufficient transportation source Assessed social determinant of health barriers          Please call the care guide team at [phone] if you need to cancel or reschedule your appointment.   If you are experiencing a Mental Health or [place] or need someone to talk to, please call the Suicide and Crisis Lifeline: 988 call the [place] National Suicide Prevention Lifeline: 4048579485 or TTY: 209 127 6856 TTY 386-711-9172) to talk to a trained counselor call 1-800-273-TALK (toll free, 24 hour hotline) call the Suicide and Crisis Lifeline: 988 Patient verbalizes understanding of instructions and care plan provided today and agrees to view in [place]. Active MyChart status and patient understanding of how to access instructions and care plan via MyChart confirmed with patient.     No further follow up required: No needs  Raina Mina, RN Care Management Coordinator [place] Office [phone]

## 2022-04-20 NOTE — Patient Outreach (Signed)
  Care Coordination   Initial Visit Note   04/20/2022 Name: Randall Lara MRN: [id number] DOB: [birth year]  Randall Lara is a 88 y.o. year old male who sees Randall Lara, Randall Sierras, MD for primary care. I  spoke with the DPR son Randall Lara  What matters to the patients health and wellness today?  No needs    Goals Addressed             This Visit's Progress    COMPLETED: No needs       Care Coordination Interventions: Advised patient to to scheduled the pt's AWV with his provider's office  Reviewed medications with patient and discussed adherence and educated accordingly Reviewed scheduled/upcoming provider appointments including pending appointments and verified sufficient transportation source Assessed social determinant of health barriers          SDOH assessments and interventions completed:  Yes  SDOH Interventions Today    Flowsheet Row Most Recent Value  SDOH Interventions   Food Insecurity Interventions Intervention Not Indicated  Housing Interventions Intervention Not Indicated  Transportation Interventions Intervention Not Indicated  Utilities Interventions Intervention Not Indicated        Care Coordination Interventions Activated:  Yes  Care Coordination Interventions:  Yes, provided   Follow up plan: No further intervention required.   Encounter Outcome:  Pt. Visit Completed   Raina Mina, RN Care Management Coordinator [place] Office [phone]

## 2022-05-04 ENCOUNTER — Encounter: Payer: Self-pay | Admitting: Family Medicine

## 2022-05-04 ENCOUNTER — Ambulatory Visit (INDEPENDENT_AMBULATORY_CARE_PROVIDER_SITE_OTHER): Payer: Medicare Other | Admitting: Family Medicine

## 2022-05-04 VITALS — BP 122/66 | HR 64 | Temp 97.6°F | Ht 72.0 in | Wt 196.2 lb

## 2022-05-04 DIAGNOSIS — Z8679 Personal history of other diseases of the circulatory system: Secondary | ICD-10-CM | POA: Diagnosis not present

## 2022-05-04 DIAGNOSIS — I10 Essential (primary) hypertension: Secondary | ICD-10-CM

## 2022-05-04 DIAGNOSIS — F339 Major depressive disorder, recurrent, unspecified: Secondary | ICD-10-CM

## 2022-05-04 MED ORDER — NYSTATIN-TRIAMCINOLONE 100000-0.1 UNIT/GM-% EX OINT: 1.0000 | TOPICAL_OINTMENT | Freq: Two times a day (BID) | CUTANEOUS | 2 refills | Status: DC

## 2022-05-04 MED ORDER — ARIPIPRAZOLE 2 MG PO TABS: 2.0000 mg | ORAL_TABLET | Freq: Every day | ORAL | 3 refills | Status: DC

## 2022-05-04 NOTE — Progress Notes (Signed)
Established Patient Office Visit  Subjective   Patient ID: Randall Lara, male    DOB: 1929/07/23  Age: 88 y.o. MRN: [id number]  Chief Complaint  Patient presents with   Follow-up    HPI   Patient seen for medical follow-up.  His wife has advanced dementia and he spends a good amount of time caring for her.  They do have a niece that lives beside them that helps considerably with his medications as well as watching her at times.  Patient feels like he has declined significantly past few years.  Will turn 89 soon.  He has chronic problems including hypertension, osteoarthritis, severe aortic stenosis, recurrent depression, chronic severe anxiety.  He had echo back earlier this year which showed severe calcification aortic valve with some stenosis but patient denies any recent syncope, dizziness, or increased shortness of breath.  Fairly sedentary.  Remains on Eliquis and also takes benazepril HCTZ regularly.  He takes several medications for depression including bupropion, Remeron, low-dose Abilify.  He has been on alprazolam for well over 30 years for chronic anxiety symptoms.  He has been tried off this multiple times without success.  No recent falls.  Still battles depression some but feels overall improved with current medications.  Past Medical History:  Diagnosis Date   Anxiety    Arthritis    Atrial fibrillation (HCC)    Depression    Dysrhythmia    atrial fib   GERD (gastroesophageal reflux disease)    no meds needed- diet controlled   Heart palpitations    Hypertension    Joint pain    Obesity    Past Surgical History:  Procedure Laterality Date   CATARACT EXTRACTION Left    CATARACT EXTRACTION W/PHACO Right 03/05/2016   Procedure: CATARACT EXTRACTION PHACO AND INTRAOCULAR LENS PLACEMENT ; CDE:  11.47;  Surgeon: Tonny Branch, MD;  Location: [facility];  Service: Ophthalmology;  Laterality: Right;   ESOPHAGOGASTRODUODENOSCOPY  03/29/2002   RCV:ELFYBO esophagus, stomach, and  duodenum through the second  portion.   hernia repair Bilateral    Inguinal   RIGHT/LEFT HEART CATH AND CORONARY ANGIOGRAPHY N/A 09/19/2021   Procedure: RIGHT/LEFT HEART CATH AND CORONARY ANGIOGRAPHY;  Surgeon: Burnell Blanks, MD;  Location: [place] [facility];  Service: Cardiovascular;  Laterality: N/A;   TOTAL KNEE ARTHROPLASTY Right 11/13/2015   Procedure: TOTAL KNEE ARTHROPLASTY;  Surgeon: Latanya Maudlin, MD;  Location: [facility];  Service: Orthopedics;  Laterality: Right;    reports that he quit smoking about 46 years ago. His smoking use included cigarettes. He started smoking about 66 years ago. He has a 20.00 pack-year smoking history. He has never used smokeless tobacco. He reports that he does not currently use alcohol. He reports that he does not use drugs. family history includes Heart attack in his brother; Hypertension in his mother and sister; Stroke in his father, mother, and sister. Allergies  Allergen Reactions   Ferrous Sulfate Hives and Swelling    Patient doesn't recall.   Sulfa Antibiotics Hives and Swelling    Patient doesn't recall.    Review of Systems  Constitutional:  Positive for malaise/fatigue. Negative for chills and fever.  Eyes:  Negative for blurred vision.  Respiratory:  Negative for shortness of breath.   Cardiovascular:  Negative for chest pain.  Gastrointestinal:  Negative for abdominal pain.  Genitourinary:  Negative for dysuria.  Neurological:  Negative for dizziness, weakness and headaches.      Objective:  BP 122/66 (BP Location: Left Arm, Patient Position: Sitting, Cuff Size: Large)   Pulse 64   Temp 97.6 F (36.4 C) (Oral)   Ht 6' (1.829 m)   Wt 196 lb 3.2 oz (89 kg)   SpO2 97%   BMI 26.61 kg/m  BP Readings from Last 3 Encounters:  05/04/22 122/66  12/22/21 126/74  12/09/21 (!) 150/70   Wt Readings from Last 3 Encounters:  05/04/22 196 lb 3.2 oz (89 kg)  12/22/21 195 lb 12.8 oz (88.8 kg)  12/09/21 193 lb 4.8 oz (87.7  kg)      Physical Exam Vitals reviewed.  Constitutional:      General: He is not in acute distress. Cardiovascular:     Rate and Rhythm: Normal rate.     Heart sounds: Murmur heard.     Comments: has fairly prominent 3/6 systolic ejection murmur right upper sternal border Pulmonary:     Effort: Pulmonary effort is normal.     Breath sounds: Normal breath sounds. No wheezing or rales.  Musculoskeletal:     Right lower leg: No edema.     Left lower leg: No edema.  Neurological:     Mental Status: He is alert.      No results found for any visits on 05/04/22.  Last CBC Lab Results  Component Value Date   WBC 4.7 10/06/2021   HGB 12.6 (L) 10/06/2021   HCT 38.9 (L) 10/06/2021   MCV 97.7 10/06/2021   MCH 31.7 10/06/2021   RDW 14.1 10/06/2021   PLT 106 (L) 79/09/8331   Last metabolic panel Lab Results  Component Value Date   GLUCOSE 96 10/06/2021   NA 142 10/06/2021   K 3.8 10/06/2021   CL 106 10/06/2021   CO2 29 10/06/2021   BUN 28 (H) 10/06/2021   CREATININE 1.30 (H) 10/06/2021   GFRNONAA 52 (L) 10/06/2021   CALCIUM 9.5 10/06/2021   PROT 6.4 08/02/2019   ALBUMIN 3.9 08/02/2019   BILITOT 0.7 08/02/2019   ALKPHOS 75 08/02/2019   AST 27 08/02/2019   ALT 18 08/02/2019   ANIONGAP 7 10/06/2021      The ASCVD Risk score (Arnett DK, et al., 2019) failed to calculate for the following reasons:   The 2019 ASCVD risk score is only valid for ages 43 to 61    Assessment & Plan:   #1 hypertension stable and at goal.  Continue benazepril HCTZ.  Needs follow-up labs soon.  He plans to get these through cardiology.  #2 chronic atrial fibrillation.  Followed by cardiology.  Patient on Eliquis.  Rate controlled.  We did provide some samples of Eliquis per his request today.  He has future lab ordered through cardiology for future CBC and basic metabolic panel  #3 history of aortic valve stenosis.  Prominent murmur.  No recent dyspnea or dizziness  #4 chronic recurrent  anxiety and depression.  Patient on multiple medications as above.  Refilled his Abilify 2 mg nightly for 1 year.   Return in about 6 months (around 11/03/2022).    Carolann Littler, MD

## 2022-05-13 ENCOUNTER — Other Ambulatory Visit: Payer: Self-pay | Admitting: Family Medicine

## 2022-06-15 ENCOUNTER — Other Ambulatory Visit: Payer: Self-pay | Admitting: Family Medicine

## 2022-06-15 NOTE — Telephone Encounter (Signed)
Pt called to request a refill of the ALPRAZolam (XANAX) 1 MG tablet   Pt states he only has a few left and is begging MD to please refill, as soon as possible, and feels like he is going to have a nervous breakdown.  Pt also asked if it was possible for MD to send 2-3 months worth, so that he does not have to keep calling every month.  [facility] - [place], [place] Phone: [phone]     LOV:  05/04/22

## 2022-06-22 ENCOUNTER — Other Ambulatory Visit: Payer: Self-pay | Admitting: Family Medicine

## 2022-07-01 DIAGNOSIS — X32XXXD Exposure to sunlight, subsequent encounter: Secondary | ICD-10-CM | POA: Diagnosis not present

## 2022-07-01 DIAGNOSIS — L570 Actinic keratosis: Secondary | ICD-10-CM | POA: Diagnosis not present

## 2022-07-02 ENCOUNTER — Ambulatory Visit: Payer: Medicare Other | Admitting: Cardiology

## 2022-07-17 ENCOUNTER — Inpatient Hospital Stay (HOSPITAL_COMMUNITY)
Admission: EM | Admit: 2022-07-17 | Discharge: 2022-07-20 | DRG: 309 | Disposition: A | Discharge status: Home / Self Care | Payer: Medicare Other | Attending: Family Medicine | Admitting: Family Medicine

## 2022-07-17 ENCOUNTER — Encounter (HOSPITAL_COMMUNITY): Payer: Self-pay | Admitting: Emergency Medicine

## 2022-07-17 ENCOUNTER — Ambulatory Visit: Payer: Medicare Other | Attending: Internal Medicine | Admitting: Internal Medicine

## 2022-07-17 ENCOUNTER — Telehealth: Payer: Self-pay | Admitting: Cardiology

## 2022-07-17 ENCOUNTER — Emergency Department (HOSPITAL_COMMUNITY): Payer: Medicare Other

## 2022-07-17 ENCOUNTER — Other Ambulatory Visit: Payer: Self-pay

## 2022-07-17 ENCOUNTER — Encounter: Payer: Self-pay | Admitting: *Deleted

## 2022-07-17 VITALS — BP 100/60 | HR 52 | Ht 72.0 in | Wt 192.6 lb

## 2022-07-17 DIAGNOSIS — I2510 Atherosclerotic heart disease of native coronary artery without angina pectoris: Secondary | ICD-10-CM | POA: Diagnosis not present

## 2022-07-17 DIAGNOSIS — I493 Ventricular premature depolarization: Secondary | ICD-10-CM | POA: Diagnosis not present

## 2022-07-17 DIAGNOSIS — R001 Bradycardia, unspecified: Secondary | ICD-10-CM | POA: Diagnosis not present

## 2022-07-17 DIAGNOSIS — Z8249 Family history of ischemic heart disease and other diseases of the circulatory system: Secondary | ICD-10-CM

## 2022-07-17 DIAGNOSIS — Z87891 Personal history of nicotine dependence: Secondary | ICD-10-CM

## 2022-07-17 DIAGNOSIS — Z79899 Other long term (current) drug therapy: Secondary | ICD-10-CM

## 2022-07-17 DIAGNOSIS — I4821 Permanent atrial fibrillation: Secondary | ICD-10-CM | POA: Diagnosis present

## 2022-07-17 DIAGNOSIS — Z8679 Personal history of other diseases of the circulatory system: Secondary | ICD-10-CM | POA: Diagnosis not present

## 2022-07-17 DIAGNOSIS — I495 Sick sinus syndrome: Principal | ICD-10-CM | POA: Diagnosis present

## 2022-07-17 DIAGNOSIS — Z7901 Long term (current) use of anticoagulants: Secondary | ICD-10-CM | POA: Diagnosis not present

## 2022-07-17 DIAGNOSIS — N179 Acute kidney failure, unspecified: Secondary | ICD-10-CM | POA: Diagnosis present

## 2022-07-17 DIAGNOSIS — N1831 Chronic kidney disease, stage 3a: Secondary | ICD-10-CM | POA: Diagnosis not present

## 2022-07-17 DIAGNOSIS — F322 Major depressive disorder, single episode, severe without psychotic features: Secondary | ICD-10-CM | POA: Diagnosis not present

## 2022-07-17 DIAGNOSIS — Z888 Allergy status to other drugs, medicaments and biological substances status: Secondary | ICD-10-CM

## 2022-07-17 DIAGNOSIS — J42 Unspecified chronic bronchitis: Secondary | ICD-10-CM | POA: Diagnosis not present

## 2022-07-17 DIAGNOSIS — E1122 Type 2 diabetes mellitus with diabetic chronic kidney disease: Secondary | ICD-10-CM | POA: Diagnosis not present

## 2022-07-17 DIAGNOSIS — D696 Thrombocytopenia, unspecified: Secondary | ICD-10-CM | POA: Diagnosis present

## 2022-07-17 DIAGNOSIS — Z880 Allergy status to penicillin: Secondary | ICD-10-CM | POA: Diagnosis not present

## 2022-07-17 DIAGNOSIS — F411 Generalized anxiety disorder: Secondary | ICD-10-CM | POA: Diagnosis present

## 2022-07-17 DIAGNOSIS — Z823 Family history of stroke: Secondary | ICD-10-CM | POA: Diagnosis not present

## 2022-07-17 DIAGNOSIS — Z96651 Presence of right artificial knee joint: Secondary | ICD-10-CM | POA: Diagnosis not present

## 2022-07-17 DIAGNOSIS — I129 Hypertensive chronic kidney disease with stage 1 through stage 4 chronic kidney disease, or unspecified chronic kidney disease: Secondary | ICD-10-CM | POA: Diagnosis not present

## 2022-07-17 DIAGNOSIS — I352 Nonrheumatic aortic (valve) stenosis with insufficiency: Secondary | ICD-10-CM | POA: Diagnosis present

## 2022-07-17 DIAGNOSIS — K219 Gastro-esophageal reflux disease without esophagitis: Secondary | ICD-10-CM | POA: Diagnosis not present

## 2022-07-17 DIAGNOSIS — I4891 Unspecified atrial fibrillation: Secondary | ICD-10-CM | POA: Diagnosis not present

## 2022-07-17 DIAGNOSIS — I700 Atherosclerosis of aorta: Secondary | ICD-10-CM | POA: Diagnosis not present

## 2022-07-17 DIAGNOSIS — E876 Hypokalemia: Secondary | ICD-10-CM | POA: Diagnosis not present

## 2022-07-17 DIAGNOSIS — I10 Essential (primary) hypertension: Secondary | ICD-10-CM | POA: Diagnosis present

## 2022-07-17 DIAGNOSIS — I350 Nonrheumatic aortic (valve) stenosis: Secondary | ICD-10-CM | POA: Diagnosis not present

## 2022-07-17 DIAGNOSIS — I499 Cardiac arrhythmia, unspecified: Secondary | ICD-10-CM | POA: Diagnosis not present

## 2022-07-17 LAB — CBC
HCT: 38.5 % — ABNORMAL LOW (ref 39.0–52.0)
Hemoglobin: 12.6 g/dL — ABNORMAL LOW (ref 13.0–17.0)
MCH: 32.3 pg (ref 26.0–34.0)
MCHC: 32.7 g/dL (ref 30.0–36.0)
MCV: 98.7 fL (ref 80.0–100.0)
Platelets: 115 10*3/uL — ABNORMAL LOW (ref 150–400)
RBC: 3.9 MIL/uL — ABNORMAL LOW (ref 4.22–5.81)
RDW: 13.8 % (ref 11.5–15.5)
WBC: 4.9 10*3/uL (ref 4.0–10.5)
nRBC: 0 % (ref 0.0–0.2)

## 2022-07-17 LAB — BASIC METABOLIC PANEL
Anion gap: 10 (ref 5–15)
BUN: 32 mg/dL — ABNORMAL HIGH (ref 8–23)
CO2: 28 mmol/L (ref 22–32)
Calcium: 9.6 mg/dL (ref 8.9–10.3)
Chloride: 102 mmol/L (ref 98–111)
Creatinine, Ser: 1.44 mg/dL — ABNORMAL HIGH (ref 0.61–1.24)
GFR, Estimated: 46 mL/min — ABNORMAL LOW (ref >60)
Glucose, Bld: 112 mg/dL — ABNORMAL HIGH (ref 70–99)
Potassium: 3.9 mmol/L (ref 3.5–5.1)
Sodium: 140 mmol/L (ref 135–145)

## 2022-07-17 LAB — TROPONIN I (HIGH SENSITIVITY): Troponin I (High Sensitivity): 21 ng/L — ABNORMAL HIGH (ref <18)

## 2022-07-17 MED ORDER — HYDROCHLOROTHIAZIDE 25 MG PO TABS: 25.0000 mg | ORAL_TABLET | Freq: Every day | ORAL | Status: DC

## 2022-07-17 MED ORDER — OXYCODONE HCL 5 MG PO TABS: 5.0000 mg | ORAL_TABLET | ORAL | Status: DC | PRN

## 2022-07-17 MED ORDER — ONDANSETRON HCL 4 MG PO TABS: 4.0000 mg | ORAL_TABLET | Freq: Four times a day (QID) | ORAL | Status: DC | PRN

## 2022-07-17 MED ORDER — APIXABAN 5 MG PO TABS: 5.0000 mg | ORAL_TABLET | Freq: Two times a day (BID) | ORAL | Status: DC

## 2022-07-17 MED ORDER — ACETAMINOPHEN 650 MG RE SUPP: 650.0000 mg | Freq: Four times a day (QID) | RECTAL | Status: DC | PRN

## 2022-07-17 MED ORDER — ACETAMINOPHEN 325 MG PO TABS: 650.0000 mg | ORAL_TABLET | Freq: Four times a day (QID) | ORAL | Status: DC | PRN

## 2022-07-17 MED ORDER — ONDANSETRON HCL 4 MG/2ML IJ SOLN: 4.0000 mg | Freq: Four times a day (QID) | INTRAMUSCULAR | Status: DC | PRN

## 2022-07-17 MED ORDER — MIRTAZAPINE 30 MG PO TBDP
30.0000 mg | ORAL_TABLET | Freq: Every day | ORAL | Status: DC
  Administered 2022-07-17 – 2022-07-19 (×3): 30 mg via ORAL
  Filled 2022-07-17 (×5): qty 1

## 2022-07-17 MED ORDER — BUPROPION HCL ER (XL) 150 MG PO TB24
300.0000 mg | ORAL_TABLET | Freq: Every day | ORAL | Status: DC
  Administered 2022-07-18 – 2022-07-20 (×3): 300 mg via ORAL
  Filled 2022-07-17 (×3): qty 2

## 2022-07-17 MED ORDER — ARIPIPRAZOLE 2 MG PO TABS
2.0000 mg | ORAL_TABLET | Freq: Every day | ORAL | Status: DC
  Administered 2022-07-18 – 2022-07-20 (×3): 2 mg via ORAL
  Filled 2022-07-17 (×5): qty 1

## 2022-07-17 MED ORDER — HYDRALAZINE HCL 20 MG/ML IJ SOLN: 5.0000 mg | Freq: Four times a day (QID) | INTRAMUSCULAR | Status: DC | PRN

## 2022-07-17 MED ORDER — MORPHINE SULFATE (PF) 2 MG/ML IV SOLN: 2.0000 mg | INTRAVENOUS | Status: DC | PRN

## 2022-07-17 MED ORDER — ALPRAZOLAM 0.5 MG PO TABS
1.0000 mg | ORAL_TABLET | Freq: Three times a day (TID) | ORAL | Status: DC
  Administered 2022-07-18 – 2022-07-20 (×7): 1 mg via ORAL
  Filled 2022-07-17 (×6): qty 2

## 2022-07-17 MED ORDER — BENAZEPRIL-HYDROCHLOROTHIAZIDE 20-12.5 MG PO TABS: 1.0000 | ORAL_TABLET | Freq: Every day | ORAL | Status: DC

## 2022-07-17 NOTE — Patient Instructions (Addendum)
Medication Instructions:  Your physician recommends that you continue on your current medications as directed. Please refer to the Current Medication list given to you today.  Labwork: none  Testing/Procedures: none  Follow-Up: Your physician recommends that you schedule a follow-up appointment in: after ED visit Go to [facility] ED now for an evaluation.  Any Other Special Instructions Will Be Listed Below (If Applicable).  If you need a refill on your cardiac medications before your next appointment, please call your pharmacy.

## 2022-07-17 NOTE — Assessment & Plan Note (Signed)
-   Continue Xanax 

## 2022-07-17 NOTE — ED Notes (Signed)
Unable to locate pt to be roomed, pt not in xray, blood has already been drawn, pt called from [place] and [place] x 2 with no answer, call placed to cell phone number in chart, however, this is son's number, son not present but states "he is pretty impatient. He may have left"

## 2022-07-17 NOTE — ED Triage Notes (Signed)
Pt reports he feels like heartbeat is irregular and sometimes skips beats since last night; pt reports hx of afib

## 2022-07-17 NOTE — ED Notes (Signed)
Hospitalist at bedside 

## 2022-07-17 NOTE — Assessment & Plan Note (Signed)
-   apixaban BID for anticoagulation - Currently bradycardic in A-fib - Continue to monitor

## 2022-07-17 NOTE — Progress Notes (Signed)
Nurse visit changed to office visit

## 2022-07-17 NOTE — Assessment & Plan Note (Addendum)
-   Cardiology note recommends holding hydrochlorothiazide, BPs are soft; holding ACEI - As needed hydralazine for systolic blood pressure greater than 175

## 2022-07-17 NOTE — Progress Notes (Signed)
Cardiology Office Note  Date: 07/17/2022   ID: Randall Lara, DOB 1930/05/04, MRN [id number]  PCP:  [name], MD  Cardiologist:  Chalmers Guest, MD Electrophysiologist:  None   Reason for Office Visit: Patient walked into the clinic with palpitations   History of Present Illness: Randall Lara is a 89 y.o. male known to have permanent A-fib on [facility] (not on rate controlling agents), severe aortic stenosis (refused TAVR), critical CAD (ostial to proximal 70% LAD stenosis in 09/2021 on medical management), dementia walked into the clinic today for evaluation of palpitations.  Patient reported having palpitations (feeling skipped beats followed by a long pause, his heart skipping beat again) for a few days. He denied any dizziness, lightheadedness, presyncope or syncope but it is highly unlikely patient might have forgotten his symptoms due to dementia. However upon chart review, patient did have prior episodes of presyncope and syncope requiring hospitalization. He was seen by EP, Dr. Lovena Le in the early part of 2023 and did not think his symptoms of presyncope/syncope was secondary to slow A-fib.  He offered him ILR insertion versus watchful waiting and patient chose watchful waiting.   Past Medical History:  Diagnosis Date   Anxiety    Arthritis    Atrial fibrillation (HCC)    Depression    Dysrhythmia    atrial fib   GERD (gastroesophageal reflux disease)    no meds needed- diet controlled   Heart palpitations    Hypertension    Joint pain    Obesity     Past Surgical History:  Procedure Laterality Date   CATARACT EXTRACTION Left    CATARACT EXTRACTION W/PHACO Right 03/05/2016   Procedure: CATARACT EXTRACTION PHACO AND INTRAOCULAR LENS PLACEMENT ; CDE:  11.47;  Surgeon: Tonny Branch, MD;  Location: [facility];  Service: Ophthalmology;  Laterality: Right;   ESOPHAGOGASTRODUODENOSCOPY  03/29/2002   OIZ:TIWPYK esophagus, stomach, and duodenum through the second  portion.    hernia repair Bilateral    Inguinal   RIGHT/LEFT HEART CATH AND CORONARY ANGIOGRAPHY N/A 09/19/2021   Procedure: RIGHT/LEFT HEART CATH AND CORONARY ANGIOGRAPHY;  Surgeon: Burnell Blanks, MD;  Location: [place] [facility];  Service: Cardiovascular;  Laterality: N/A;   TOTAL KNEE ARTHROPLASTY Right 11/13/2015   Procedure: TOTAL KNEE ARTHROPLASTY;  Surgeon: Latanya Maudlin, MD;  Location: [facility];  Service: Orthopedics;  Laterality: Right;    Current Outpatient Medications  Medication Sig Dispense Refill   ALPRAZolam (XANAX) 1 MG tablet TAKE ONE TABLET BY MOUTH THREE TIMES DAILY 90 tablet 5   ARIPiprazole (ABILIFY) 2 MG tablet Take 1 tablet (2 mg total) by mouth daily. 90 tablet 3   benazepril-hydrochlorthiazide (LOTENSIN HCT) 20-12.5 MG tablet TAKE ONE TABLET BY MOUTH ONCE DAILY 90 tablet 3   buPROPion (WELLBUTRIN XL) 300 MG 24 hr tablet Take 1 tablet (300 mg total) by mouth daily. 90 tablet 1   ELIQUIS 5 MG TABS tablet TAKE ONE TABLET BY MOUTH TWICE DAILY 180 tablet 2   mirtazapine (REMERON SOL-TAB) 30 MG disintegrating tablet DISSOLVE ONE TABLET BY MOUTH AT BEDTIME 90 tablet 1   nitroGLYCERIN (NITROSTAT) 0.4 MG SL tablet DISSOLVE ONE TABLET UNDER TONGUE EVERY 5 MINUTES UP TO 3 DOSES AS NEEDED FOR CHEST PAIN. DO NOT EXCEED A TOTAL OF 3 DOSES IN 15 MINUTES. 25 tablet 0   nystatin-triamcinolone ointment (MYCOLOG) Apply 1 Application topically 2 (two) times daily. Apply to affected rash twice daily as needed 60 g 2  polyethylene glycol (MIRALAX / GLYCOLAX) 17 g packet Take 17 g by mouth daily.     psyllium (METAMUCIL) 58.6 % powder Take 1 packet by mouth daily.     furosemide (LASIX) 20 MG tablet Take 1 tablet (20 mg total) by mouth as directed. X 3 days only, then only as advised by physician. (Patient not taking: Reported on 07/17/2022) 30 tablet 0   No current facility-administered medications for this visit.   Allergies:  Ferrous sulfate and Sulfa antibiotics   Social History: The  patient  reports that he quit smoking about 46 years ago. His smoking use included cigarettes. He started smoking about 66 years ago. He has a 20.00 pack-year smoking history. He has never used smokeless tobacco. He reports that he does not currently use alcohol. He reports that he does not use drugs.   Family History: The patient's family history includes Heart attack in his brother; Hypertension in his mother and sister; Stroke in his father, mother, and sister.   ROS:  Please see the history of present illness. Otherwise, complete review of systems is positive for none.  All other systems are reviewed and negative.   Physical Exam: VS:  BP 100/60   Pulse (!) 52   Ht 6' (1.829 m)   Wt 192 lb 9.6 oz (87.4 kg)   SpO2 96%   BMI 26.12 kg/m , BMI Body mass index is 26.12 kg/m.  Wt Readings from Last 3 Encounters:  07/17/22 192 lb (87.1 kg)  07/17/22 192 lb 9.6 oz (87.4 kg)  05/04/22 196 lb 3.2 oz (89 kg)    General: Patient appears comfortable at rest. HEENT: Conjunctiva and lids normal, oropharynx clear with moist mucosa. Neck: Supple, no elevated JVP or carotid bruits, no thyromegaly. Lungs: Clear to auscultation, nonlabored breathing at rest. Cardiac: Irregular rate and rhythm, no S3 or significant systolic murmur, no pericardial rub. Abdomen: Soft, nontender, no hepatomegaly, bowel sounds present, no guarding or rebound. Extremities: No pitting edema, distal pulses 2+. Skin: Warm and dry. Musculoskeletal: No kyphosis. Neuropsychiatric: Alert and oriented x3, affect grossly appropriate.  ECG: A-fib with slow ventricular rate and frequent PVCs  Recent Labwork: 07/17/2022: BUN 32; Creatinine, Ser 1.44; Hemoglobin 12.6; Platelets 115; Potassium 3.9; Sodium 140  No results found for: "CHOL", "TRIG", "HDL", "CHOLHDL", "VLDL", "LDLCALC", "LDLDIRECT"  Other Studies Reviewed Today: I reviewed the [place] from 09/2021 and echocardiogram from 08/2021  Assessment and Plan: Patient is a  89 year old M known to have permanent A-fib on AC (not on rate controlling agents), severe aortic stenosis (refused TAVR), critical CAD (ostial to proximal 70% LAD stenosis in 09/2021 on medical management), dementia walked into the clinic today for evaluation of palpitations.  # Permanent A-fib with slow ventricular response and multifocal PVCs -Patient is complaining of palpitations likely from multifocal PVCs. He has baseline permanent atrial fibrillation. He will need suppression of PVCs with antiarrhythmics like amiodarone. He will need to be admitted to [facility], ER and transferred to [facility] in [place] for EP consult and possible PPM placement. I spoke with Dr. Roderic Palau, ER attending at [facility] who is aware of the patient's arrival today.  He will need to be admitted to the hospitalist team at [facility] and transferred to [facility] in [place] for EP evaluation and possible PPM. -Hold AV nodal agents -Keep K > 4 and < 5; Mg > 2 and < 3; -Decrease the dose of Eliquis from 5 mg to 2.5 mg twice daily (due  to borderline serum creatinine of 1.4)  # Severe aortic stenosis -Patient refused TAVR  # Critical CAD, ostial to proximal 70% LAD stenosis (medical management) -Not on aspirin due to Eliquis -Not on statin for unclear reasons  # HTN, controlled -Discontinue HCTZ due to worsening CKD -Continue benazepril 20 mg once daily  I have spent a total of 51 minutes with patient reviewing chart, EKGs, labs and examining patient as well as establishing an assessment and plan that was discussed with the patient.  > 50% of time was spent in direct patient care.      Medication Adjustments/Labs and Tests Ordered: Current medicines are reviewed at length with the patient today.  Concerns regarding medicines are outlined above.   Tests Ordered: Orders Placed This Encounter  Procedures   EKG 12-Lead    Medication Changes: No orders of the defined types were placed  in this encounter.   Disposition:  Follow up  with Dr Domenic Polite or Dr Harl Bowie post hospitalization  Signed Anays Detore Fidel Levy, MD, 07/17/2022 6:53 PM    [place] [facility] [place], [place], [place] [id number]

## 2022-07-17 NOTE — ED Provider Notes (Signed)
[facility] EMERGENCY DEPARTMENT Provider Note   CSN: [id number] Arrival date & time: 07/17/22  1653     History {Add pertinent medical, surgical, social history, OB history to HPI:1} Chief Complaint  Patient presents with   Irregular Heart Beat    Randall Lara is a 89 y.o. male.  Patient has a history of atrial fibs and hypertension.  He has also had some syncopal episodes.  Patient has a history of dementia also.  He was seen by cardiology today and they realized that he was bradycardic and atrial fibs.  The cardiologist felt like the patient needed to be admitted for the bradycardia and to get a pacemaker   Weakness      Home Medications Prior to Admission medications   Medication Sig Start Date End Date Taking? Authorizing Provider  ALPRAZolam Duanne Moron) 1 MG tablet TAKE ONE TABLET BY MOUTH THREE TIMES DAILY 06/15/22   Burchette, Alinda Sierras, MD  ARIPiprazole (ABILIFY) 2 MG tablet Take 1 tablet (2 mg total) by mouth daily. 05/04/22   Burchette, Alinda Sierras, MD  benazepril-hydrochlorthiazide (LOTENSIN HCT) 20-12.5 MG tablet TAKE ONE TABLET BY MOUTH ONCE DAILY 03/12/22   Burchette, Alinda Sierras, MD  buPROPion (WELLBUTRIN XL) 300 MG 24 hr tablet Take 1 tablet (300 mg total) by mouth daily. 02/18/22   Burchette, Alinda Sierras, MD  ELIQUIS 5 MG TABS tablet TAKE ONE TABLET BY MOUTH TWICE DAILY 07/15/21   Burchette, Alinda Sierras, MD  furosemide (LASIX) 20 MG tablet Take 1 tablet (20 mg total) by mouth as directed. X 3 days only, then only as advised by physician. Patient not taking: Reported on 07/17/2022 09/01/21 12/22/21  Imogene Burn, PA-C  mirtazapine (REMERON SOL-TAB) 30 MG disintegrating tablet DISSOLVE ONE TABLET BY MOUTH AT BEDTIME 02/18/22   Burchette, Alinda Sierras, MD  nitroGLYCERIN (NITROSTAT) 0.4 MG SL tablet DISSOLVE ONE TABLET UNDER TONGUE EVERY 5 MINUTES UP TO 3 DOSES AS NEEDED FOR CHEST PAIN. DO NOT EXCEED A TOTAL OF 3 DOSES IN 15 MINUTES. 12/31/20   Burchette, Alinda Sierras, MD  nystatin-triamcinolone ointment  (MYCOLOG) Apply 1 Application topically 2 (two) times daily. Apply to affected rash twice daily as needed 05/04/22   Burchette, Alinda Sierras, MD  polyethylene glycol (MIRALAX / GLYCOLAX) 17 g packet Take 17 g by mouth daily.    [provider]  psyllium (METAMUCIL) 58.6 % powder Take 1 packet by mouth daily.    [provider]      Allergies    Ferrous sulfate and Sulfa antibiotics    Review of Systems   Review of Systems  Neurological:  Positive for weakness.    Physical Exam Updated Vital Signs BP 128/65   Pulse (!) 41   Temp (!) 97.5 F (36.4 C) (Oral)   Resp 18   Ht 6' (1.829 m)   Wt 87.1 kg   SpO2 91%   BMI 26.04 kg/m  Physical Exam  ED Results / Procedures / Treatments   Labs (all labs ordered are listed, but only abnormal results are displayed) Labs Reviewed  BASIC METABOLIC PANEL - Abnormal; Notable for the following components:      Result Value   Glucose, Bld 112 (*)    BUN 32 (*)    Creatinine, Ser 1.44 (*)    GFR, Estimated 46 (*)    All other components within normal limits  CBC - Abnormal; Notable for the following components:   RBC 3.90 (*)    Hemoglobin 12.6 (*)  HCT 38.5 (*)    Platelets 115 (*)    All other components within normal limits    EKG None  Radiology DG Chest 2 View  Result Date: 07/17/2022 CLINICAL DATA:  Irregular heartbeat EXAM: CHEST - 2 VIEW COMPARISON:  Prior chest x-ray 11/05/2015 FINDINGS: The patient is rotated toward the right. Grossly stable cardiac and mediastinal contours given degree of rotation. Atherosclerotic calcifications present in the transverse aorta. No significant pulmonary vascular congestion or evidence of pulmonary edema. Stable chronic bronchitic changes and interstitial prominence. No acute osseous abnormality. IMPRESSION: Grossly stable chest x-ray given rightward rotated position of the patient. No acute cardiopulmonary process. Aortic atherosclerosis and chronic bronchitic changes.  Electronically Signed   By: Jacqulynn Cadet M.D.   On: 07/17/2022 17:45    Procedures Procedures  {Document cardiac monitor, telemetry assessment procedure when appropriate:1}  Medications Ordered in ED Medications - No data to display  ED Course/ Medical Decision Making/ A&P                           Medical Decision Making Amount and/or Complexity of Data Reviewed Labs: ordered. Radiology: ordered.  Risk Decision regarding hospitalization.   Patient will be admitted by medicine to [facility] to get a pacemaker  {Document critical care time when appropriate:1} {Document review of labs and clinical decision tools ie heart score, Chads2Vasc2 etc:1}  {Document your independent review of radiology images, and any outside records:1} {Document your discussion with family members, caretakers, and with consultants:1} {Document social determinants of health affecting pt's care:1} {Document your decision making why or why not admission, treatments were needed:1} Final Clinical Impression(s) / ED Diagnoses Final diagnoses:  Bradycardia    Rx / DC Orders ED Discharge Orders     None

## 2022-07-17 NOTE — Telephone Encounter (Signed)
Pt has walked into the [place] office stating his heart has been out of rhythm since last night.   Asked pt to have a seat in the lobby

## 2022-07-17 NOTE — ED Notes (Signed)
Pt's son reports pt has been in need of surgery to aortic valve x 1-2 years ago but pt has elected to decline surgery

## 2022-07-17 NOTE — Assessment & Plan Note (Addendum)
-   During exam heart rate down to 39 - soft blood pressures being monitored closely - Admitted to [facility] for cardiology/EP eval and consideration of PPM - Monitor on telemetry - Medication list reviewed and there are calcium channel blockers or beta-blockers to hold - Hold ACE-I due to soft BPs -Check TSH - Continue to monitor

## 2022-07-17 NOTE — Telephone Encounter (Signed)
Reports heart being out rhythm for few days and described as skipping beats and fluttering. Says last night it was beating hard. Says today he took nitroglycerin x's 3 today and symptoms improved. Denies sob, chest pain or dizziness. Medications reviewed, EKG and vitals done.  Scheduled 1st available appointment with Mallipeddi to get established on 07/23/2022 '@8'$ :00 am Advised if symptoms get worse between now and then, to go to the ED for an evaluation. Verbalized understanding.  Discussed with Mallipeddi and evaluated in office today.

## 2022-07-17 NOTE — H&P (Signed)
History and Physical    Patient: Randall Lara DXI:[id number] DOB: 02/28/1930 DOA: 07/17/2022 DOS: the patient was seen and examined on 07/17/2022 PCP: [name], MD  Patient coming from: Home  Chief Complaint:  Chief Complaint  Patient presents with   Irregular Heart Beat   HPI: Randall Lara is a 89 y.o. male with medical history significant of anxiety, atrial fibrillation, depression, GERD, A-fib, and more presents the ED with a chief complaint of palpitations.  Patient reports he has had palpitations that started last night.  It lasted 3-5 hours last night.  They seem to go away when he took a Xanax.  They are worse with even minimal exertion.  He has no associated dyspnea, chest pain, dizziness.  He is not on any new medications.  He has otherwise been in his normal state of health.  He did see cardiology today in office, and they recommended him to come to the ER for admission for possible pacemaker placement.  Patient has stage III CKD.  Cardiology note recommends discontinuing hydrochlorothiazide.  Will hold hydrochlorothiazide while in hospital and replace with as needed IV hydralazine.  Patient had no other complaints at this time.  Patient does not smoke, does not drink alcohol, does not use illicit drugs.  He is due for COVID booster.  He is full code. Review of Systems: As mentioned in the history of present illness. All other systems reviewed and are negative. Past Medical History:  Diagnosis Date   Anxiety    Arthritis    Atrial fibrillation (HCC)    Depression    Dysrhythmia    atrial fib   GERD (gastroesophageal reflux disease)    no meds needed- diet controlled   Heart palpitations    Hypertension    Joint pain    Obesity    Past Surgical History:  Procedure Laterality Date   CATARACT EXTRACTION Left    CATARACT EXTRACTION W/PHACO Right 03/05/2016   Procedure: CATARACT EXTRACTION PHACO AND INTRAOCULAR LENS PLACEMENT ; CDE:  11.47;  Surgeon: Tonny Branch, MD;   Location: [facility];  Service: Ophthalmology;  Laterality: Right;   ESOPHAGOGASTRODUODENOSCOPY  03/29/2002   JQB:HALPFX esophagus, stomach, and duodenum through the second  portion.   hernia repair Bilateral    Inguinal   RIGHT/LEFT HEART CATH AND CORONARY ANGIOGRAPHY N/A 09/19/2021   Procedure: RIGHT/LEFT HEART CATH AND CORONARY ANGIOGRAPHY;  Surgeon: Burnell Blanks, MD;  Location: [facility];  Service: Cardiovascular;  Laterality: N/A;   TOTAL KNEE ARTHROPLASTY Right 11/13/2015   Procedure: TOTAL KNEE ARTHROPLASTY;  Surgeon: Latanya Maudlin, MD;  Location: [facility];  Service: Orthopedics;  Laterality: Right;   Social History:  reports that he quit smoking about 46 years ago. His smoking use included cigarettes. He started smoking about 66 years ago. He has a 20.00 pack-year smoking history. He has never used smokeless tobacco. He reports that he does not currently use alcohol. He reports that he does not use drugs.  Allergies  Allergen Reactions   Ferrous Sulfate Hives and Swelling    Patient doesn't recall.   Sulfa Antibiotics Hives and Swelling    Patient doesn't recall.    Family History  Problem Relation Age of Onset   Stroke Mother    Hypertension Mother    Stroke Father    Stroke Sister    Heart attack Brother    Hypertension Sister    Colon cancer Neg Hx     Prior to Admission medications  Medication Sig Start Date End Date Taking? Authorizing Provider  ALPRAZolam Duanne Moron) 1 MG tablet TAKE ONE TABLET BY MOUTH THREE TIMES DAILY 06/15/22   Burchette, Alinda Sierras, MD  ARIPiprazole (ABILIFY) 2 MG tablet Take 1 tablet (2 mg total) by mouth daily. 05/04/22   Burchette, Alinda Sierras, MD  benazepril-hydrochlorthiazide (LOTENSIN HCT) 20-12.5 MG tablet TAKE ONE TABLET BY MOUTH ONCE DAILY 03/12/22   Burchette, Alinda Sierras, MD  buPROPion (WELLBUTRIN XL) 300 MG 24 hr tablet Take 1 tablet (300 mg total) by mouth daily. 02/18/22   Burchette, Alinda Sierras, MD  ELIQUIS 5 MG TABS tablet TAKE ONE TABLET  BY MOUTH TWICE DAILY 07/15/21   Burchette, Alinda Sierras, MD  furosemide (LASIX) 20 MG tablet Take 1 tablet (20 mg total) by mouth as directed. X 3 days only, then only as advised by physician. Patient not taking: Reported on 07/17/2022 09/01/21 12/22/21  Imogene Burn, PA-C  mirtazapine (REMERON SOL-TAB) 30 MG disintegrating tablet DISSOLVE ONE TABLET BY MOUTH AT BEDTIME 02/18/22   Burchette, Alinda Sierras, MD  nitroGLYCERIN (NITROSTAT) 0.4 MG SL tablet DISSOLVE ONE TABLET UNDER TONGUE EVERY 5 MINUTES UP TO 3 DOSES AS NEEDED FOR CHEST PAIN. DO NOT EXCEED A TOTAL OF 3 DOSES IN 15 MINUTES. 12/31/20   Burchette, Alinda Sierras, MD  nystatin-triamcinolone ointment (MYCOLOG) Apply 1 Application topically 2 (two) times daily. Apply to affected rash twice daily as needed 05/04/22   Burchette, Alinda Sierras, MD  polyethylene glycol (MIRALAX / GLYCOLAX) 17 g packet Take 17 g by mouth daily.    [provider]  psyllium (METAMUCIL) 58.6 % powder Take 1 packet by mouth daily.    [provider]    Physical Exam: Vitals:   07/17/22 1720 07/17/22 1721 07/17/22 1815 07/17/22 1830  BP: (!) 155/60  137/67 128/65  Pulse: 84  (!) 29 (!) 41  Resp: '14  17 18  '$ Temp: (!) 97.5 F (36.4 C)     TempSrc: Oral     SpO2: 90%  97% 91%  Weight:  87.1 kg    Height:  6' (1.829 m)     1.  General: Patient lying supine in bed,  no acute distress   2. Psychiatric: Alert and oriented x 3, mood and behavior normal for situation, pleasant and cooperative with exam   3. Neurologic: Speech and language are normal, face is symmetric, moves all 4 extremities voluntarily, at baseline without acute deficits on limited exam   4. HEENMT:  Head is atraumatic, normocephalic, pupils reactive to light, neck is supple, trachea is midline, mucous membranes are moist   5. Respiratory : Lungs are clear to auscultation bilaterally without wheezing, rhonchi, rales, no cyanosis, no increase in work of breathing or accessory muscle use   6.  Cardiovascular : Heart rate bradycardic, rhythm is irregular, systolic murmur, no rubs or gallops, no peripheral edema, peripheral pulses palpated   7. Gastrointestinal:  Abdomen is soft, nondistended, nontender to palpation bowel sounds active, no masses or organomegaly palpated   8. Skin:  Skin is warm, dry and intact without rashes, acute lesions, or ulcers on limited exam   9.Musculoskeletal:  No acute deformities or trauma, no asymmetry in tone, no peripheral edema, peripheral pulses palpated, no tenderness to palpation in the extremities  Data Reviewed: In the ED Temp 97.5, heart rate 30s, respiratory rate 14-18, blood pressure 100/60-155/67, satting at 90-97% At times heart rate reads higher but this is due to PVCs No leukocytosis with a white blood cell  count of 4.9, hemoglobin 12.6 Chemistry reveals a BUN 32, creatinine 1.44 Chest x-ray has no acute findings EKG shows atrial fibrillation, heart rate 30s, QTc 445 Admission was requested for pacemaker evaluation  Assessment and Plan: * Symptomatic bradycardia - During exam heart rate down to 39 - Maintaining blood pressure - Admit to [facility] for evaluation for pacemaker - Monitor on telemetry - Medication list reviewed and there are calcium channel blockers or beta-blockers to hold - Hold ACE-I -Check TSH - Continue to monitor  CAD (coronary artery disease) - Holding ACE - Monitor on telemetry - Add on troponin to previous blood work for completeness -  Depression, major, single episode, severe (HCC) - Continue Wellbutrin, and Abilify  History of atrial fibrillation - Holding Eliquis in the setting of possible surgical procedure - Currently bradycardic in A-fib - Continue to monitor  Essential hypertension - Cardiology note recommends holding hydrochlorothiazide - As needed hydralazine for systolic blood pressure greater than 175  Anxiety state - Continue Xanax      Advance Care Planning:   Code  Status: Full Code  Consults: Cardiology  Family Communication: Son at bedside  Severity of Illness: The appropriate patient status for this patient is INPATIENT. Inpatient status is judged to be reasonable and necessary in order to provide the required intensity of service to ensure the patient's safety. The patient's presenting symptoms, physical exam findings, and initial radiographic and laboratory data in the context of their chronic comorbidities is felt to place them at high risk for further clinical deterioration. Furthermore, it is not anticipated that the patient will be medically stable for discharge from the hospital within 2 midnights of admission.   * I certify that at the point of admission it is my clinical judgment that the patient will require inpatient hospital care spanning beyond 2 midnights from the point of admission due to high intensity of service, high risk for further deterioration and high frequency of surveillance required.*  Author: Rolla Plate, DO 07/17/2022 7:51 PM  For on call review [email or web address].

## 2022-07-17 NOTE — Assessment & Plan Note (Signed)
-   Continue Wellbutrin, and Abilify

## 2022-07-17 NOTE — Assessment & Plan Note (Addendum)
-   Holding ACE - Monitor on telemetry - Add on troponin to previous blood work for completeness -

## 2022-07-18 DIAGNOSIS — I10 Essential (primary) hypertension: Secondary | ICD-10-CM | POA: Diagnosis not present

## 2022-07-18 DIAGNOSIS — R001 Bradycardia, unspecified: Secondary | ICD-10-CM | POA: Diagnosis not present

## 2022-07-18 DIAGNOSIS — E876 Hypokalemia: Secondary | ICD-10-CM | POA: Diagnosis not present

## 2022-07-18 DIAGNOSIS — I4821 Permanent atrial fibrillation: Secondary | ICD-10-CM

## 2022-07-18 DIAGNOSIS — I2510 Atherosclerotic heart disease of native coronary artery without angina pectoris: Secondary | ICD-10-CM | POA: Diagnosis not present

## 2022-07-18 DIAGNOSIS — F411 Generalized anxiety disorder: Secondary | ICD-10-CM | POA: Diagnosis not present

## 2022-07-18 DIAGNOSIS — I350 Nonrheumatic aortic (valve) stenosis: Secondary | ICD-10-CM

## 2022-07-18 LAB — CBC WITH DIFFERENTIAL/PLATELET
Abs Immature Granulocytes: 0.01 10*3/uL (ref 0.00–0.07)
Basophils Absolute: 0 10*3/uL (ref 0.0–0.1)
Basophils Relative: 1 %
Eosinophils Absolute: 0.2 10*3/uL (ref 0.0–0.5)
Eosinophils Relative: 4 %
HCT: 35 % — ABNORMAL LOW (ref 39.0–52.0)
Hemoglobin: 11.7 g/dL — ABNORMAL LOW (ref 13.0–17.0)
Immature Granulocytes: 0 %
Lymphocytes Relative: 35 %
Lymphs Abs: 1.6 10*3/uL (ref 0.7–4.0)
MCH: 32.3 pg (ref 26.0–34.0)
MCHC: 33.4 g/dL (ref 30.0–36.0)
MCV: 96.7 fL (ref 80.0–100.0)
Monocytes Absolute: 0.5 10*3/uL (ref 0.1–1.0)
Monocytes Relative: 12 %
Neutro Abs: 2.2 10*3/uL (ref 1.7–7.7)
Neutrophils Relative %: 48 %
Platelets: 99 10*3/uL — ABNORMAL LOW (ref 150–400)
RBC: 3.62 MIL/uL — ABNORMAL LOW (ref 4.22–5.81)
RDW: 13.5 % (ref 11.5–15.5)
WBC: 4.4 10*3/uL (ref 4.0–10.5)
nRBC: 0 % (ref 0.0–0.2)

## 2022-07-18 LAB — COMPREHENSIVE METABOLIC PANEL
ALT: 16 U/L (ref 0–44)
AST: 29 U/L (ref 15–41)
Albumin: 3.2 g/dL — ABNORMAL LOW (ref 3.5–5.0)
Alkaline Phosphatase: 72 U/L (ref 38–126)
Anion gap: 7 (ref 5–15)
BUN: 29 mg/dL — ABNORMAL HIGH (ref 8–23)
CO2: 28 mmol/L (ref 22–32)
Calcium: 9.1 mg/dL (ref 8.9–10.3)
Chloride: 107 mmol/L (ref 98–111)
Creatinine, Ser: 1.22 mg/dL (ref 0.61–1.24)
GFR, Estimated: 56 mL/min — ABNORMAL LOW (ref >60)
Glucose, Bld: 93 mg/dL (ref 70–99)
Potassium: 3.3 mmol/L — ABNORMAL LOW (ref 3.5–5.1)
Sodium: 142 mmol/L (ref 135–145)
Total Bilirubin: 0.6 mg/dL (ref 0.3–1.2)
Total Protein: 5.9 g/dL — ABNORMAL LOW (ref 6.5–8.1)

## 2022-07-18 LAB — TSH: TSH: 3.604 u[IU]/mL (ref 0.350–4.500)

## 2022-07-18 LAB — MAGNESIUM: Magnesium: 1.6 mg/dL — ABNORMAL LOW (ref 1.7–2.4)

## 2022-07-18 MED ORDER — APIXABAN 5 MG PO TABS
5.0000 mg | ORAL_TABLET | Freq: Two times a day (BID) | ORAL | Status: DC
  Filled 2022-07-18: qty 1

## 2022-07-18 MED ORDER — POTASSIUM CHLORIDE 10 MEQ/100ML IV SOLN
10.0000 meq | INTRAVENOUS | Status: AC
  Administered 2022-07-18 (×3): 10 meq via INTRAVENOUS
  Filled 2022-07-18 (×3): qty 100

## 2022-07-18 MED ORDER — LACTATED RINGERS IV SOLN: INTRAVENOUS | Status: DC

## 2022-07-18 MED ORDER — HEPARIN (PORCINE) 25000 UT/250ML-% IV SOLN
900.0000 [IU]/h | INTRAVENOUS | Status: DC
  Administered 2022-07-18: 900 [IU]/h via INTRAVENOUS
  Filled 2022-07-18: qty 250

## 2022-07-18 MED ORDER — MORPHINE SULFATE (PF) 2 MG/ML IV SOLN: 0.5000 mg | INTRAVENOUS | Status: DC | PRN

## 2022-07-18 MED ORDER — MAGNESIUM SULFATE 4 GM/100ML IV SOLN
4.0000 g | Freq: Once | INTRAVENOUS | Status: AC
  Administered 2022-07-18: 4 g via INTRAVENOUS
  Filled 2022-07-18: qty 100

## 2022-07-18 NOTE — Plan of Care (Signed)
  Problem: Education: Goal: Knowledge of General Education information will improve Description: Including pain rating scale, medication(s)/side effects and non-pharmacologic comfort measures Outcome: Progressing   Problem: Health Behavior/Discharge Planning: Goal: Ability to manage health-related needs will improve Outcome: Progressing   Problem: Clinical Measurements: Goal: Cardiovascular complication will be avoided Outcome: Progressing   Problem: Safety: Goal: Ability to remain free from injury will improve Outcome: Progressing   Problem: Skin Integrity: Goal: Risk for impaired skin integrity will decrease Outcome: Progressing   Problem: Coping: Goal: Level of anxiety will decrease Outcome: Progressing

## 2022-07-18 NOTE — Hospital Course (Signed)
89 y.o. male with medical history significant of anxiety, atrial fibrillation, depression, GERD, A-fib, and more presents the ED with a chief complaint of palpitations.  Patient reports he has had palpitations that started last night.  It lasted 3-5 hours last night.  They seem to go away when he took a Xanax.  They are worse with even minimal exertion.  He has no associated dyspnea, chest pain, dizziness.  He is not on any new medications.  He has otherwise been in his normal state of health.  He did see cardiology today in office, and they recommended him to come to the ER for admission for possible pacemaker placement.  Patient has stage III CKD.  Cardiology note recommends discontinuing hydrochlorothiazide.  Will hold hydrochlorothiazide while in hospital and replace with as needed IV hydralazine.  Patient had no other complaints at this time.   Patient does not smoke, does not drink alcohol, does not use illicit drugs.  He is due for COVID booster.  He is full code.

## 2022-07-18 NOTE — ED Notes (Signed)
Pt refused Eliquis and informed this RN that a doctor yesterday told him to not take Eliquis d/t upcoming procedure. Dr. Wynetta Emery notified.

## 2022-07-18 NOTE — Progress Notes (Addendum)
PROGRESS NOTE   Randall Lara  OEU:[id number] DOB: 12-Jan-1930 DOA: 07/17/2022 PCP: [name], MD   Chief Complaint  Patient presents with   Irregular Heart Beat   Level of care: Telemetry Cardiac  Brief Admission History:  89 y.o. male with medical history significant of anxiety, atrial fibrillation, depression, GERD, A-fib, and more presents the ED with a chief complaint of palpitations.  Patient reports he has had palpitations that started last night.  It lasted 3-5 hours last night.  They seem to go away when he took a Xanax.  They are worse with even minimal exertion.  He has no associated dyspnea, chest pain, dizziness.  He is not on any new medications.  He has otherwise been in his normal state of health.  He did see cardiology today in office, and they recommended him to come to the ER for admission for possible pacemaker placement.  Patient has stage III CKD.  Cardiology note recommends discontinuing hydrochlorothiazide.  Will hold hydrochlorothiazide while in hospital and replace with as needed IV hydralazine.  Patient had no other complaints at this time.   Patient does not smoke, does not drink alcohol, does not use illicit drugs.  He is due for COVID booster.  He is full code.   Assessment and Plan: * Symptomatic bradycardia - During exam heart rate down to 39 - soft blood pressures being monitored closely - Admitted to [facility] for cardiology/EP eval and consideration of PPM - Monitor on telemetry - Medication list reviewed and there are calcium channel blockers or beta-blockers to hold - Hold ACE-I due to soft BPs -Check TSH - Continue to monitor  Hypokalemia --IV replacement given, recheck in AM   Hypomagnesemia --IV replacement given, recheck in AM  CAD (coronary artery disease) - Held ACE due to AKI and soft BPs - Monitor on telemetry - Add on troponin to previous blood work for completeness - still waiting on home meds to be reconciled by pharmacy  (additional request made 1/6)  Depression, major, single episode, severe (HCC) - Continue Wellbutrin, and Abilify  History of atrial fibrillation - apixaban BID for anticoagulation - Currently bradycardic in A-fib - Continue to monitor  Essential hypertension - Cardiology note recommends holding hydrochlorothiazide, BPs are soft; holding ACEI - As needed hydralazine for systolic blood pressure greater than 175  Anxiety state - Continue Xanax  DVT prophylaxis: apixaban Code Status: Full  Family Communication:  Disposition: Status is: Inpatient Remains inpatient appropriate because: intensity    Consultants:  cardiology Procedures:   Antimicrobials:    Subjective: Pt is very angry and upset he has been holding in ED for "16 hours" waiting for bed and transfer to [facility]. He denies CP, SOB and palpitations.   Objective: Vitals:   07/18/22 0400 07/18/22 0600 07/18/22 0814 07/18/22 0815  BP: 128/65   (!) 116/59  Pulse: (!) 41 (!) 42  (!) 38  Resp: '17 19  16  '$ Temp: 97.8 F (36.6 C)  (!) 97.4 F (36.3 C)   TempSrc: Oral  Oral   SpO2: 94% 95%  95%  Weight:      Height:        Intake/Output Summary (Last 24 hours) at 07/18/2022 1540 Last data filed at 07/18/2022 0913 Gross per 24 hour  Intake 99.02 ml  Output 200 ml  Net -100.98 ml   Filed Weights   07/17/22 1721  Weight: 87.1 kg   Examination:  General exam: frail, elderly male, awake, alert, oriented x3,  Appears calm and comfortable  Respiratory system: Clear to auscultation. Respiratory effort normal. Cardiovascular system: normal S1 & S2 heard. Severe bradycardia; No JVD, murmurs, rubs, gallops or clicks. No pedal edema. Gastrointestinal system: Abdomen is nondistended, soft and nontender. No organomegaly or masses felt. Normal bowel sounds heard. Central nervous system: Alert and oriented. No focal neurological deficits. Extremities: Symmetric 5 x 5 power. Skin: No rashes, lesions or ulcers. Psychiatry: Judgement  and insight appear normal. Mood & affect appropriate.   Data Reviewed: I have personally reviewed following labs and imaging studies  CBC: Recent Labs  Lab 07/17/22 1752 07/18/22 0621  WBC 4.9 4.4  NEUTROABS  --  2.2  HGB 12.6* 11.7*  HCT 38.5* 35.0*  MCV 98.7 96.7  PLT 115* 99*    Basic Metabolic Panel: Recent Labs  Lab 07/17/22 1752 07/18/22 0621  NA 140 142  K 3.9 3.3*  CL 102 107  CO2 28 28  GLUCOSE 112* 93  BUN 32* 29*  CREATININE 1.44* 1.22  CALCIUM 9.6 9.1  MG  --  1.6*    CBG: No results for input(s): "GLUCAP" in the last 168 hours.  No results found for this or any previous visit (from the past 240 hour(s)).   Radiology Studies: DG Chest 2 View  Result Date: 07/17/2022 CLINICAL DATA:  Irregular heartbeat EXAM: CHEST - 2 VIEW COMPARISON:  Prior chest x-ray 11/05/2015 FINDINGS: The patient is rotated toward the right. Grossly stable cardiac and mediastinal contours given degree of rotation. Atherosclerotic calcifications present in the transverse aorta. No significant pulmonary vascular congestion or evidence of pulmonary edema. Stable chronic bronchitic changes and interstitial prominence. No acute osseous abnormality. IMPRESSION: Grossly stable chest x-ray given rightward rotated position of the patient. No acute cardiopulmonary process. Aortic atherosclerosis and chronic bronchitic changes. Electronically Signed   By: Jacqulynn Cadet M.D.   On: 07/17/2022 17:45    Scheduled Meds:  ALPRAZolam  1 mg Oral TID   apixaban  5 mg Oral BID   ARIPiprazole  2 mg Oral Daily   buPROPion  300 mg Oral Daily   mirtazapine  30 mg Oral QHS   Continuous Infusions:  lactated ringers 30 mL/hr at 07/18/22 9924   magnesium sulfate bolus IVPB 4 g (07/18/22 0810)   potassium chloride 10 mEq (07/18/22 0917)     LOS: 1 day   Time spent: 36 mins  Jacky Dross Wynetta Emery, MD How to contact the [facility] Attending or Consulting provider [place] or covering provider during after hours [place], for this patient?  Check the care team in [facility] and look for a) attending/consulting [facility] provider listed and b) the [facility] team listed Log into [email or web address] and use [place]'s universal password to access. If you do not have the password, please contact the hospital operator. Locate the [facility] provider you are looking for under [facility] and page to a number that you can be directly reached. If you still have difficulty reaching the provider, please page the [facility] (Director on Call) for the Hospitalists listed on amion for assistance.  07/18/2022, 9:26 AM

## 2022-07-18 NOTE — TOC Progression Note (Signed)
  Transition of Care [facility]) Screening Note   Patient Details  Name: Randall Lara Date of Birth: 10/13/1929   Transition of Care [facility]) CM/SW Contact:    Boneta Lucks, RN Phone Number: 07/18/2022, 1:22 PM    Transition of Care Department [facility]) has reviewed patient and no TOC needs have been identified at this time. We will continue to monitor patient advancement through interdisciplinary progression rounds. If new patient transition needs arise, please place a TOC consult.

## 2022-07-18 NOTE — Assessment & Plan Note (Signed)
--  IV replacement given, recheck in AM

## 2022-07-18 NOTE — Consult Note (Signed)
Cardiology Consultation   Patient ID: Randall Lara MRN: [id number]; DOB: [birth year]  Admit date: 07/17/2022 Date of Consult: 07/18/2022  PCP:  [name], MD   [place] Providers Cardiologist:  None        Patient Profile:   Randall Lara is a 89 y.o. male with a hx of  permanent A-fib on AC (not on rate controlling agents), severe aortic stenosis (refused TAVR), critical CAD (ostial to proximal 70% LAD stenosis in 09/2021 on medical management), dementia who is being seen 07/18/2022 for the evaluation of bradycardia (Afib with slow VR) at the request of Dr Josph Macho.  History of Present Illness:   Randall Lara is a 89 y.o. male with a hx of  permanent A-fib on AC (not on rate controlling agents), severe aortic stenosis (refused TAVR), critical CAD (ostial to proximal 70% LAD stenosis in 09/2021 on medical management), dementia who is being seen 07/18/2022 for the evaluation of bradycardia (Afib with slow VR)   Patient was seen at Cardiologist office on 07/17/22- presented with palpitations, skipped beats, but had denied dizzy/lightheadedness, syncope or presyncop- however, given DEMENTIA- unreliable, and was sent to [facility] for [facility]-subsequently transferred to [facility].  Since admission, patient on telemetry- HR hovering btw 40-50s, BP stable  He reports that he feels HR skipped beats and occasional dizziness but denies syncope, He is able to walk at home with assistance, denies any falls. Denies any chest pain, CHF symptoms. He is coherent and curious about his Afib management (has had it for 40 years) and also asks about pacemaker Currently he is hemodynamically stable K 3.3, Cr 1.22 Trop 21   EKG: 07/17/22: afib with slow VR (R-R is regular) indicating high degree AV block and has frequent PVCs Repeat EKG shows afib, HR 57, frequent PVCs - almost bigeminal pattern  Relevant CV Studies: ECHO: 09/03/21 IMPRESSIONS     1. Left ventricular ejection fraction, by estimation, is  60 to 65%. The  left ventricle has normal function. Left ventricular endocardial border  not optimally defined to evaluate regional wall motion. There is moderate  left ventricular hypertrophy. Left  ventricular diastolic parameters are indeterminate.   2. Right ventricular systolic function is normal. The right ventricular  size is normal. There is normal pulmonary artery systolic pressure.   3. Left atrial size was moderately dilated.   4. The mitral valve was not well visualized. No evidence of mitral valve  regurgitation. No evidence of mitral stenosis.   5. Aortic valve mean gradient 26 mmHg, AVA VTI 0.81, DI 0.24, SVI 29.  Findings would be consistent with paradoxical low flow low gradient severe  aortic stenosis. . The aortic valve was not well visualized. There is  severe calcifcation of the aortic  valve. There is severe thickening of the aortic valve. Aortic valve  regurgitation is trivial.   CATH: 09/2021 Left Anterior Descending  Vessel is large.  Ost LAD to Prox LAD lesion is 70% stenosed. The lesion is calcified.  Mid LAD lesion is 40% stenosed.    Left Circumflex  Vessel is moderate in size.  Prox Cx to Mid Cx lesion is 30% stenosed.    Right Coronary Artery  Vessel is large.  Prox RCA lesion is 20% stenosed. The lesion is calcified.  Mid RCA lesion is 30% stenosed. The lesion is calcified.    Right Posterior Descending Artery  RPDA lesion is 60% stenosed.       Mid RCA lesion is 30% stenosed.  Prox RCA lesion is 20% stenosed.   RPDA lesion is 60% stenosed.   Prox Cx to Mid Cx lesion is 30% stenosed.   Mid LAD lesion is 40% stenosed.   Ost LAD to Prox LAD lesion is 70% stenosed.   Moderate to severe, heavily calcified proximal LAD stenosis followed by an aneurysmal segment in the mid LAD.  Mild non-obstructive disease in the Circumflex Heavily calcified, large dominant RCA with mild non-obstructive disease in the proximal and mid vessel. The PDA has  moderate ostial stenosis.  Severe aortic stenosis by echo. Peak to peak gradient 10-15 mmHg by cath today.    Recommendations: His LAD is heavily calcified with moderately severe proximal stenosis. He has no angina. I favor medical management of his CAD at this time. Will continue workup for TAVR.   Past Medical History:  Diagnosis Date   Anxiety    Arthritis    Atrial fibrillation (HCC)    Depression    Dysrhythmia    atrial fib   GERD (gastroesophageal reflux disease)    no meds needed- diet controlled   Heart palpitations    Hypertension    Joint pain    Obesity     Past Surgical History:  Procedure Laterality Date   CATARACT EXTRACTION Left    CATARACT EXTRACTION W/PHACO Right 03/05/2016   Procedure: CATARACT EXTRACTION PHACO AND INTRAOCULAR LENS PLACEMENT ; CDE:  11.47;  Surgeon: Tonny Branch, MD;  Location: [facility];  Service: Ophthalmology;  Laterality: Right;   ESOPHAGOGASTRODUODENOSCOPY  03/29/2002   NLG:XQJJHE esophagus, stomach, and duodenum through the second  portion.   hernia repair Bilateral    Inguinal   RIGHT/LEFT HEART CATH AND CORONARY ANGIOGRAPHY N/A 09/19/2021   Procedure: RIGHT/LEFT HEART CATH AND CORONARY ANGIOGRAPHY;  Surgeon: Burnell Blanks, MD;  Location: [facility];  Service: Cardiovascular;  Laterality: N/A;   TOTAL KNEE ARTHROPLASTY Right 11/13/2015   Procedure: TOTAL KNEE ARTHROPLASTY;  Surgeon: Latanya Maudlin, MD;  Location: [facility];  Service: Orthopedics;  Laterality: Right;     Home Medications:  Prior to Admission medications   Medication Sig Start Date End Date Taking? Authorizing Provider  ALPRAZolam Duanne Moron) 1 MG tablet TAKE ONE TABLET BY MOUTH THREE TIMES DAILY 06/15/22  Yes Burchette, Alinda Sierras, MD  ARIPiprazole (ABILIFY) 2 MG tablet Take 1 tablet (2 mg total) by mouth daily. 05/04/22  Yes Burchette, Alinda Sierras, MD  benazepril-hydrochlorthiazide (LOTENSIN HCT) 20-12.5 MG tablet TAKE ONE TABLET BY MOUTH ONCE DAILY 03/12/22  Yes Burchette,  Alinda Sierras, MD  buPROPion (WELLBUTRIN XL) 300 MG 24 hr tablet Take 1 tablet (300 mg total) by mouth daily. 02/18/22  Yes Burchette, Alinda Sierras, MD  ELIQUIS 5 MG TABS tablet TAKE ONE TABLET BY MOUTH TWICE DAILY 07/15/21  Yes Burchette, Alinda Sierras, MD  mirtazapine (REMERON SOL-TAB) 30 MG disintegrating tablet DISSOLVE ONE TABLET BY MOUTH AT BEDTIME 02/18/22  Yes Burchette, Alinda Sierras, MD  nitroGLYCERIN (NITROSTAT) 0.4 MG SL tablet DISSOLVE ONE TABLET UNDER TONGUE EVERY 5 MINUTES UP TO 3 DOSES AS NEEDED FOR CHEST PAIN. DO NOT EXCEED A TOTAL OF 3 DOSES IN 15 MINUTES. 12/31/20  Yes Burchette, Alinda Sierras, MD  furosemide (LASIX) 20 MG tablet Take 1 tablet (20 mg total) by mouth as directed. X 3 days only, then only as advised by physician. Patient not taking: Reported on 07/17/2022 09/01/21 12/22/21  Imogene Burn, PA-C  nystatin-triamcinolone ointment [facility]) Apply 1 Application topically 2 (two) times daily. Apply to affected rash twice  daily as needed Patient not taking: Reported on 07/18/2022 05/04/22   [name], MD    Inpatient Medications: Scheduled Meds:  ALPRAZolam  1 mg Oral TID   apixaban  5 mg Oral BID   ARIPiprazole  2 mg Oral Daily   buPROPion  300 mg Oral Daily   mirtazapine  30 mg Oral QHS   Continuous Infusions:  lactated ringers 30 mL/hr at 07/18/22 0808   PRN Meds: acetaminophen **OR** acetaminophen, hydrALAZINE, morphine injection, ondansetron **OR** ondansetron (ZOFRAN) IV, oxyCODONE  Allergies:    Allergies  Allergen Reactions   Ferrous Sulfate Hives and Swelling    Patient doesn't recall.   Sulfa Antibiotics Hives and Swelling    Patient doesn't recall.    Social History:   Social History   Socioeconomic History   Marital status: Married    Spouse name: Not on file   Number of children: 1   Years of education: Not on file   Highest education level: Not on file  Occupational History   Occupation: Retired from the [employer]   Smoking status: Former     Packs/day: 1.00    Years: 20.00    Total pack years: 20.00    Types: Cigarettes    Start date: 08/07/1955    Quit date: 08/07/1975    Years since quitting: 46.9   Smokeless tobacco: Never  Vaping Use   Vaping Use: Never used  Substance and Sexual Activity   Alcohol use: Not Currently    Comment: occassional   Drug use: No   Sexual activity: Yes    Birth control/protection: None  Other Topics Concern   Not on file  Social History Narrative   Not on file   Social Determinants of Health   Financial Resource Strain: Low Risk  (10/02/2020)   Overall Financial Resource Strain (CARDIA)    Difficulty of Paying Living Expenses: Not hard at all  Food Insecurity: No Food Insecurity (04/20/2022)   Hunger Vital Sign    Worried About Running Out of Food in the Last Year: Never true    [place] in the Last Year: Never true  Transportation Needs: No Transportation Needs (04/20/2022)   PRAPARE - Hydrologist (Medical): No    Lack of Transportation (Non-Medical): No  Physical Activity: Inactive (10/02/2020)   Exercise Vital Sign    Days of Exercise per Week: 0 days    Minutes of Exercise per Session: 0 min  Stress: No Stress Concern Present (10/02/2020)   [place]    Feeling of Stress : Not at all  Social Connections: Moderately Isolated (10/02/2020)   Social Connection and Isolation Panel [NHANES]    Frequency of Communication with Friends and Family: More than three times a week    Frequency of Social Gatherings with Friends and Family: Three times a week    Attends Religious Services: Never    Active Member of Clubs or Organizations: No    Attends Archivist Meetings: Never    Marital Status: Married  Human resources officer Violence: Not At Risk (10/02/2020)   Humiliation, Afraid, Rape, and Kick questionnaire    Fear of Current or Ex-Partner: No    Emotionally Abused: No     Physically Abused: No    Sexually Abused: No    Family History:    Family History  Problem Relation Age of Onset   Stroke Mother  Hypertension Mother    Stroke Father    Stroke Sister    Heart attack Brother    Hypertension Sister    Colon cancer Neg Hx      ROS:  Please see the history of present illness.   All other ROS reviewed and negative.     Physical Exam/Data:   Vitals:   07/18/22 1245 07/18/22 1330 07/18/22 1500 07/18/22 1545  BP: (!) 141/58 (!) 155/58 121/63 (!) 140/54  Pulse: (!) 27 (!) 30 (!) 40 (!) 45  Resp: '13 15 14 15  '$ Temp:      TempSrc:      SpO2: 96% 98% 94% 95%  Weight:      Height:        Intake/Output Summary (Last 24 hours) at 07/18/2022 1707 Last data filed at 07/18/2022 1121 Gross per 24 hour  Intake 398.01 ml  Output 200 ml  Net 198.01 ml      07/17/2022    5:21 PM 07/17/2022    3:32 PM 05/04/2022    3:33 PM  Last 3 Weights  Weight (lbs) 192 lb 192 lb 9.6 oz 196 lb 3.2 oz  Weight (kg) 87.091 kg 87.363 kg 88.996 kg     Body mass index is 26.04 kg/m.  General:  Well nourished, well developed, in no acute distress, old gentleman HEENT: normal Neck: no JVD Vascular: No carotid bruits; Distal pulses 2+ bilaterally Cardiac:  irregularly irregular, ejection systolic murmur+ Lungs:  clear to auscultation bilaterally, no wheezing, rhonchi or rales  Abd: soft, nontender, no hepatomegaly  Ext: no edema Musculoskeletal:  No deformities, BUE and BLE strength normal and equal Skin: warm and dry  Neuro:  CNs 2-12 intact, no focal abnormalities noted Psych:  Normal affect   EKG:  The EKG was personally reviewed and demonstrates:  afib Telemetry:  Telemetry was personally reviewed and demonstrates:  afib with frequent PVCs, HR 57   Laboratory Data:  High Sensitivity Troponin:   Recent Labs  Lab 07/17/22 1752  TROPONINIHS 21*     Chemistry Recent Labs  Lab 07/17/22 1752 07/18/22 0621  NA 140 142  K 3.9 3.3*  CL 102 107  CO2 28 28   GLUCOSE 112* 93  BUN 32* 29*  CREATININE 1.44* 1.22  CALCIUM 9.6 9.1  MG  --  1.6*  GFRNONAA 46* 56*  ANIONGAP 10 7    Recent Labs  Lab 07/18/22 0621  PROT 5.9*  ALBUMIN 3.2*  AST 29  ALT 16  ALKPHOS 72  BILITOT 0.6   Lipids No results for input(s): "CHOL", "TRIG", "HDL", "LABVLDL", "LDLCALC", "CHOLHDL" in the last 168 hours.  Hematology Recent Labs  Lab 07/17/22 1752 07/18/22 0621  WBC 4.9 4.4  RBC 3.90* 3.62*  HGB 12.6* 11.7*  HCT 38.5* 35.0*  MCV 98.7 96.7  MCH 32.3 32.3  MCHC 32.7 33.4  RDW 13.8 13.5  PLT 115* 99*   Thyroid  Recent Labs  Lab 07/18/22 0621  TSH 3.604    BNPNo results for input(s): "BNP", "PROBNP" in the last 168 hours.  DDimer No results for input(s): "DDIMER" in the last 168 hours.   Radiology/Studies:  DG Chest 2 View  Result Date: 07/17/2022 CLINICAL DATA:  Irregular heartbeat EXAM: CHEST - 2 VIEW COMPARISON:  Prior chest x-ray 11/05/2015 FINDINGS: The patient is rotated toward the right. Grossly stable cardiac and mediastinal contours given degree of rotation. Atherosclerotic calcifications present in the transverse aorta. No significant pulmonary vascular congestion or evidence of pulmonary  edema. Stable chronic bronchitic changes and interstitial prominence. No acute osseous abnormality. IMPRESSION: Grossly stable chest x-ray given rightward rotated position of the patient. No acute cardiopulmonary process. Aortic atherosclerosis and chronic bronchitic changes. Electronically Signed   By: Jacqulynn Cadet M.D.   On: 07/17/2022 17:45     Assessment and Plan:   Permanent Afib with slow VR (initial EKG had regual R-R interval, HR in 50s)- no h/o syncope but feels skipped beats and ?dizziness. Has SSS (sick sinus syndrome) Severe AS (low flow, low gradient pararoxical AS, preserved EF 60%)- refused TAVR CAD: Ostial 70% calcified LAD stenosis 3/23 cath- medical mx, no angina H/o HFpEF 60%, euvolemic ?Dementia.  Chronic comorbidities as  listed  Plan: - EP Consultation in am - HOLD off eliquis, initiate heparin gtt. No AVN agents. At home also not on AVN. Although he might have dementia- he is coherent, talkative and lives, cooks at home. He is interested in [facility] but has refused TAVR before. Discuss case with EP regarding PPM decision trow  - looks euvolemic, hold off on lasix or Ace/arb. Initial bump in cr, but now back to baseline.  - medical mx for CAD:    Will follow along   Risk Assessment/Risk Scores:          CHA2DS2-VASc Score = 5   This indicates a 7.2% annual risk of stroke. The patient's score is based upon: CHF History: 1 HTN History: 1 Diabetes History: 0 Stroke History: 0 Vascular Disease History: 1 Age Score: 2 Gender Score: 0         For questions or updates, please contact [place] Please consult [email or web address] for contact info under    Signed, Renae Fickle, MD  07/18/2022 5:07 PM

## 2022-07-18 NOTE — Progress Notes (Signed)
ANTICOAGULATION CONSULT NOTE - Initial Consult  Pharmacy Consult for Heparin  Indication: atrial fibrillation  Allergies  Allergen Reactions   Ferrous Sulfate Hives and Swelling    Patient doesn't recall.   Sulfa Antibiotics Hives and Swelling    Patient doesn't recall.    Patient Measurements: Height: 6' (182.9 cm) Weight: 87.1 kg (192 lb) IBW/kg (Calculated) : 77.6   Vital Signs: Temp: 97.6 F (36.4 C) (01/06 1217) Temp Source: Oral (01/06 1217) BP: 154/60 (01/06 1722) Pulse Rate: 45 (01/06 1545)  Labs: Recent Labs    07/17/22 1752 07/18/22 0621  HGB 12.6* 11.7*  HCT 38.5* 35.0*  PLT 115* 99*  CREATININE 1.44* 1.22  TROPONINIHS 21*  --     Estimated Creatinine Clearance: 44.2 mL/min (by C-G formula based on SCr of 1.22 mg/dL).   Medical History: Past Medical History:  Diagnosis Date   Anxiety    Arthritis    Atrial fibrillation (HCC)    Depression    Dysrhythmia    atrial fib   GERD (gastroesophageal reflux disease)    no meds needed- diet controlled   Heart palpitations    Hypertension    Joint pain    Obesity       Assessment: 89yom with Hx Afib and slow ventricular response admitted for possible PPM.   On Apixaban pt last dose 1/5 - hold for possible procedure Will bridge with heparin drip - no bolus Apixaban falsely increases heparin level- will dose heparin with aptt until apixaban cleared  Cbc stable no bleeding noted on admit   Goal of Therapy:  aPTT 66-103 seconds Monitor platelets by anticoagulation protocol: Yes   Plan:  Stop apixaban  Begin heparin drip 900 uts/hr  Aptt and heparin level and cbc daily  Monitor s/s bleeding    Bonnita Nasuti Pharm.D. CPP, BCPS Clinical Pharmacist [phone] 07/18/2022 7:14 PM

## 2022-07-19 DIAGNOSIS — R001 Bradycardia, unspecified: Secondary | ICD-10-CM | POA: Diagnosis not present

## 2022-07-19 LAB — CBC
HCT: 33.6 % — ABNORMAL LOW (ref 39.0–52.0)
Hemoglobin: 11.7 g/dL — ABNORMAL LOW (ref 13.0–17.0)
MCH: 32.7 pg (ref 26.0–34.0)
MCHC: 34.8 g/dL (ref 30.0–36.0)
MCV: 93.9 fL (ref 80.0–100.0)
Platelets: 94 10*3/uL — ABNORMAL LOW (ref 150–400)
RBC: 3.58 MIL/uL — ABNORMAL LOW (ref 4.22–5.81)
RDW: 13.3 % (ref 11.5–15.5)
WBC: 4.6 10*3/uL (ref 4.0–10.5)
nRBC: 0 % (ref 0.0–0.2)

## 2022-07-19 LAB — BASIC METABOLIC PANEL
Anion gap: 8 (ref 5–15)
BUN: 20 mg/dL (ref 8–23)
CO2: 26 mmol/L (ref 22–32)
Calcium: 8.8 mg/dL — ABNORMAL LOW (ref 8.9–10.3)
Chloride: 105 mmol/L (ref 98–111)
Creatinine, Ser: 1.31 mg/dL — ABNORMAL HIGH (ref 0.61–1.24)
GFR, Estimated: 52 mL/min — ABNORMAL LOW (ref >60)
Glucose, Bld: 94 mg/dL (ref 70–99)
Potassium: 3.5 mmol/L (ref 3.5–5.1)
Sodium: 139 mmol/L (ref 135–145)

## 2022-07-19 LAB — APTT
aPTT: 60 seconds — ABNORMAL HIGH (ref 24–36)
aPTT: 77 seconds — ABNORMAL HIGH (ref 24–36)
aPTT: 44 seconds — ABNORMAL HIGH (ref 24–36)

## 2022-07-19 LAB — HEPARIN LEVEL (UNFRACTIONATED): Heparin Unfractionated: 0.56 IU/mL (ref 0.30–0.70)

## 2022-07-19 LAB — MAGNESIUM: Magnesium: 2.2 mg/dL (ref 1.7–2.4)

## 2022-07-19 MED ORDER — APIXABAN 5 MG PO TABS
5.0000 mg | ORAL_TABLET | Freq: Two times a day (BID) | ORAL | Status: DC
  Administered 2022-07-19 – 2022-07-20 (×3): 5 mg via ORAL
  Filled 2022-07-19 (×3): qty 1

## 2022-07-19 NOTE — Consult Note (Signed)
Electrophysiology Consultation   Patient ID: Randall Lara MRN: [id number]; DOB: 06/07/1930  Admit date: 07/17/2022 Date of Consult: 07/19/2022  PCP:  [name], MD   [facility] Providers Cardiologist:  None    Patient Profile:   Randall Lara is a 89 y.o. male with a hx of permanent AF, severe AS (declined TAVR), severe CAD for medical management, HFpEF, dementia who is being seen 07/19/2022 for the evaluation of bradycardia at the request of Dr Rudi Rummage.  History of Present Illness:   Randall Lara presented yesterday to the ER with symptomatic bradycardia. He was seen recently at his cardiologist's office with palpitations. He reported intermittent skipped beats but no syncope. No falls.   He tells me that he is very active at home. He helps take care of his wife who has Alzheimer's. He helps move her around, wash her, prepare food/drink for her, etc. His primary symptom is feeling palpitations. He feels skipped beats. He denies any syncope or presyncope. He tells me that when he feels palpitations he takes a Xanax which helps the symptoms.   Past Medical History:  Diagnosis Date   Anxiety    Arthritis    Atrial fibrillation (HCC)    Depression    Dysrhythmia    atrial fib   GERD (gastroesophageal reflux disease)    no meds needed- diet controlled   Heart palpitations    Hypertension    Joint pain    Obesity     Past Surgical History:  Procedure Laterality Date   CATARACT EXTRACTION Left    CATARACT EXTRACTION W/PHACO Right 03/05/2016   Procedure: CATARACT EXTRACTION PHACO AND INTRAOCULAR LENS PLACEMENT ; CDE:  11.47;  Surgeon: Tonny Branch, MD;  Location: [facility];  Service: Ophthalmology;  Laterality: Right;   ESOPHAGOGASTRODUODENOSCOPY  03/29/2002   WGN:FAOZHY esophagus, stomach, and duodenum through the second  portion.   hernia repair Bilateral    Inguinal   RIGHT/LEFT HEART CATH AND CORONARY ANGIOGRAPHY N/A 09/19/2021   Procedure: RIGHT/LEFT HEART  CATH AND CORONARY ANGIOGRAPHY;  Surgeon: Burnell Blanks, MD;  Location: [facility];  Service: Cardiovascular;  Laterality: N/A;   TOTAL KNEE ARTHROPLASTY Right 11/13/2015   Procedure: TOTAL KNEE ARTHROPLASTY;  Surgeon: Latanya Maudlin, MD;  Location: [facility];  Service: Orthopedics;  Laterality: Right;       Inpatient Medications: Scheduled Meds:  ALPRAZolam  1 mg Oral TID   ARIPiprazole  2 mg Oral Daily   buPROPion  300 mg Oral Daily   mirtazapine  30 mg Oral QHS   Continuous Infusions:  heparin 900 Units/hr (07/19/22 0500)   lactated ringers 30 mL/hr at 07/19/22 0625   PRN Meds: acetaminophen **OR** acetaminophen, hydrALAZINE, morphine injection, ondansetron **OR** ondansetron (ZOFRAN) IV, oxyCODONE  Allergies:    Allergies  Allergen Reactions   Ferrous Sulfate Hives and Swelling    Patient doesn't recall.   Sulfa Antibiotics Hives and Swelling    Patient doesn't recall.    Social History:   Social History   Socioeconomic History   Marital status: Married    Spouse name: Not on file   Number of children: 1   Years of education: Not on file   Highest education level: Not on file  Occupational History   Occupation: Retired from the [employer]   Smoking status: Former    Packs/day: 1.00    Years: 20.00    Total pack years: 20.00    Types: Cigarettes  Start date: 08/07/1955    Quit date: 08/07/1975    Years since quitting: 46.9   Smokeless tobacco: Never  Vaping Use   Vaping Use: Never used  Substance and Sexual Activity   Alcohol use: Not Currently    Comment: occassional   Drug use: No   Sexual activity: Yes    Birth control/protection: None  Other Topics Concern   Not on file  Social History Narrative   Not on file   Social Determinants of Health   Financial Resource Strain: Low Risk  (10/02/2020)   Overall Financial Resource Strain (CARDIA)    Difficulty of Paying Living Expenses: Not hard at all  Food Insecurity:  No Food Insecurity (07/18/2022)   Hunger Vital Sign    Worried About Running Out of Food in the Last Year: Never true    Ran Out of Food in the Last Year: Never true  Transportation Needs: No Transportation Needs (07/18/2022)   PRAPARE - Hydrologist (Medical): No    Lack of Transportation (Non-Medical): No  Physical Activity: Inactive (10/02/2020)   Exercise Vital Sign    Days of Exercise per Week: 0 days    Minutes of Exercise per Session: 0 min  Stress: No Stress Concern Present (10/02/2020)   [place]    Feeling of Stress : Not at all  Social Connections: Moderately Isolated (10/02/2020)   Social Connection and Isolation Panel [NHANES]    Frequency of Communication with Friends and Family: More than three times a week    Frequency of Social Gatherings with Friends and Family: Three times a week    Attends Religious Services: Never    Active Member of Clubs or Organizations: No    Attends Archivist Meetings: Never    Marital Status: Married  Human resources officer Violence: Not At Risk (07/18/2022)   Humiliation, Afraid, Rape, and Kick questionnaire    Fear of Current or Ex-Partner: No    Emotionally Abused: No    Physically Abused: No    Sexually Abused: No    Family History:    Family History  Problem Relation Age of Onset   Stroke Mother    Hypertension Mother    Stroke Father    Stroke Sister    Heart attack Brother    Hypertension Sister    Colon cancer Neg Hx      ROS:  Please see the history of present illness.   All other ROS reviewed and negative.     Physical Exam/Data:   Vitals:   07/18/22 2122 07/19/22 0020 07/19/22 0546 07/19/22 0910  BP: 131/61 (!) 150/84 (!) 141/80 103/61  Pulse: 85 85 (!) 55 (!) 40  Resp: '16 18 16 18  '$ Temp: 98 F (36.7 C) 98 F (36.7 C) 97.9 F (36.6 C) (!) 97.5 F (36.4 C)  TempSrc: Oral Oral Oral Oral  SpO2: 93% 92% 97% 94%   Weight:      Height:        Intake/Output Summary (Last 24 hours) at 07/19/2022 1103 Last data filed at 07/19/2022 0548 Gross per 24 hour  Intake 518.24 ml  Output 430 ml  Net 88.24 ml      07/17/2022    5:21 PM 07/17/2022    3:32 PM 05/04/2022    3:33 PM  Last 3 Weights  Weight (lbs) 192 lb 192 lb 9.6 oz 196 lb 3.2 oz  Weight (kg) 87.091 kg 87.363  kg 88.996 kg     Body mass index is 26.04 kg/m.  General:  Well nourished, well developed, in no acute distress. Elderly. HEENT: normal Neck: no JVD Vascular: No carotid bruits; Distal pulses 2+ bilaterally Cardiac:  normal S1, S2; irregularly irregular rhythm; III/VI systolic murmur c/w AS dx Lungs:  clear to auscultation bilaterally, no wheezing, rhonchi or rales  Abd: soft, nontender, no hepatomegaly  Ext: no edema Musculoskeletal:  No deformities, BUE and BLE strength normal and equal Skin: warm and dry  Neuro:  CNs 2-12 intact, no focal abnormalities noted Psych:  Normal affect   EKG:  The EKG was personally reviewed and demonstrates:  AF, frequent ventricular ectopy Telemetry:  Telemetry was personally reviewed and demonstrates:  AF, slow VR, frequen PVCs.   Relevant CV Studies:  Echo in 08/2021 shows EF 60, severe AS  Laboratory Data:  High Sensitivity Troponin:   Recent Labs  Lab 07/17/22 1752  TROPONINIHS 21*     Chemistry Recent Labs  Lab 07/17/22 1752 07/18/22 0621 07/19/22 0147  NA 140 142 139  K 3.9 3.3* 3.5  CL 102 107 105  CO2 '28 28 26  '$ GLUCOSE 112* 93 94  BUN 32* 29* 20  CREATININE 1.44* 1.22 1.31*  CALCIUM 9.6 9.1 8.8*  MG  --  1.6* 2.2  GFRNONAA 46* 56* 52*  ANIONGAP '10 7 8    '$ Recent Labs  Lab 07/18/22 0621  PROT 5.9*  ALBUMIN 3.2*  AST 29  ALT 16  ALKPHOS 72  BILITOT 0.6   Lipids No results for input(s): "CHOL", "TRIG", "HDL", "LABVLDL", "LDLCALC", "CHOLHDL" in the last 168 hours.  Hematology Recent Labs  Lab 07/17/22 1752 07/18/22 0621 07/19/22 0147  WBC 4.9 4.4 4.6  RBC  3.90* 3.62* 3.58*  HGB 12.6* 11.7* 11.7*  HCT 38.5* 35.0* 33.6*  MCV 98.7 96.7 93.9  MCH 32.3 32.3 32.7  MCHC 32.7 33.4 34.8  RDW 13.8 13.5 13.3  PLT 115* 99* 94*   Thyroid  Recent Labs  Lab 07/18/22 0621  TSH 3.604    BNPNo results for input(s): "BNP", "PROBNP" in the last 168 hours.  DDimer No results for input(s): "DDIMER" in the last 168 hours.   Radiology/Studies:  DG Chest 2 View  Result Date: 07/17/2022 CLINICAL DATA:  Irregular heartbeat EXAM: CHEST - 2 VIEW COMPARISON:  Prior chest x-ray 11/05/2015 FINDINGS: The patient is rotated toward the right. Grossly stable cardiac and mediastinal contours given degree of rotation. Atherosclerotic calcifications present in the transverse aorta. No significant pulmonary vascular congestion or evidence of pulmonary edema. Stable chronic bronchitic changes and interstitial prominence. No acute osseous abnormality. IMPRESSION: Grossly stable chest x-ray given rightward rotated position of the patient. No acute cardiopulmonary process. Aortic atherosclerosis and chronic bronchitic changes. Electronically Signed   By: Jacqulynn Cadet M.D.   On: 07/17/2022 17:45     Assessment and Plan:   Randall Lara is a 89yo man with severe AS, severe CAD, dementia and permanent AF who I am seeing today for an evaluation of his AF with slow VR and frequent PVCs. He has previously declined TAVR. His CAD is being medically managed.  #AF with slow VR Discussed conservative management and PPM during today's visit. I do not think proceeding with pacemaker is the best next step. Pacemaker implant is not without risk, especially in a 89 year old patient with severe AS and CAD. Further, his symptoms are not due to bradycardia. After our discussion, the patient is in agreement with  the treatment plan.  I would like to observe him for an additional 24 hours on telemetry to confirm no profound bradycardia. If that is ok, likely home tomorrow. Restart oral  anticoagulation.  #Frequent PVC I would not suppress with medications at this point given his AF w/ slow VR. His symptoms also do not correspond to the burden of ectopy. During my evaluation he transitioned to bigeminal PVCs and was completely asymptomatic.   #Severe AS #Severe CAD Management per primary cardiology team. Medically managed.  Recommend monitoring overnight. Likely home tomorrow.  For questions or updates, please contact [facility] Please consult [email or web address] for contact info under    Signed, Vickie Epley, MD  07/19/2022 11:03 AM

## 2022-07-19 NOTE — Progress Notes (Addendum)
PROGRESS NOTE    Randall Lara  BPZ:[id number] DOB: 1929/09/20 DOA: 07/17/2022 PCP: [name], MD   Brief Narrative:  89 y.o. male with medical history significant of anxiety, atrial fibrillation, depression, GERD, A-fib, and more presents the ED with a chief complaint of palpitations.  Patient reports he has had palpitations that started last night.  It lasted 3-5 hours last night.  They seem to go away when he took a Xanax.  They are worse with even minimal exertion.  He has no associated dyspnea, chest pain, dizziness.  He is not on any new medications.  He has otherwise been in his normal state of health.  He did see cardiology today in office, and they recommended him to come to the ER for admission for possible pacemaker placement.  Patient has stage III CKD.  Cardiology note recommends discontinuing hydrochlorothiazide.  Will hold hydrochlorothiazide while in hospital and replace with as needed IV hydralazine.  Patient had no other complaints at this time.   Patient does not smoke, does not drink alcohol, does not use illicit drugs.  He is due for COVID booster.  He is full code.  Assessment & Plan:   Principal Problem:   Symptomatic bradycardia Active Problems:   Anxiety state   Essential hypertension   History of atrial fibrillation   Depression, major, single episode, severe (HCC)   CAD (coronary artery disease)   Hypomagnesemia   Hypokalemia  Symptomatic bradycardia TSH within normal limit.  Cardiology on board.  Plan for potential PPM.  Patient is symptomatic.   Hypokalemia Resolved.   Hypomagnesemia Resolved.   CAD (coronary artery disease) - Held ACE by previous hospitalist due to AKI and soft BPs, however on my evaluation, patient's blood pressure is very labile but low this morning so I will continue to hold but patient does not appear to have AKI.  AKI ruled out.   CKD stage IIIa: At baseline.   Depression, major, single episode, severe (HCC) - Continue  Wellbutrin, and Abilify   History of atrial fibrillation - apixaban BID for anticoagulation but currently on heparin for potential planning of PPM placement. - Currently bradycardic in A-fib - Continue to monitor   Essential hypertension - Cardiology note recommends holding hydrochlorothiazide, BPs are soft; holding ACEI - As needed hydralazine for systolic blood pressure greater than 175   Anxiety state - Continue Xanax  Chronic thrombocytopenia: Stable.  DVT prophylaxis: SCDs Start: 07/17/22 1939 heparin drip   Code Status: Full Code  Family Communication:  None present at bedside.  Plan of care discussed with patient in length and he/she verbalized understanding and agreed with it.  Status is: Inpatient Remains inpatient appropriate because: Patient for PPM here.   Estimated body mass index is 26.04 kg/m as calculated from the following:   Height as of this encounter: 6' (1.829 m).   Weight as of this encounter: 87.1 kg.    Nutritional Assessment: Body mass index is 26.04 kg/m.Marland Kitchen Seen by dietician.  I agree with the assessment and plan as outlined below: Nutrition Status:        . Skin Assessment: I have examined the patient's skin and I agree with the wound assessment as performed by the wound care RN as outlined below:    Consultants:  Cardiology  Procedures:  As above  Antimicrobials:  Anti-infectives (From admission, onward)    None         Subjective: Patient seen and examined.  He has no complaints.  He  is fully alert and oriented.  He wanted to ask a few more questions about PPM from cardiology.  Objective: Vitals:   07/18/22 2122 07/19/22 0020 07/19/22 0546 07/19/22 0910  BP: 131/61 (!) 150/84 (!) 141/80 103/61  Pulse: 85 85 (!) 55 (!) 40  Resp: '16 18 16 18  '$ Temp: 98 F (36.7 C) 98 F (36.7 C) 97.9 F (36.6 C) (!) 97.5 F (36.4 C)  TempSrc: Oral Oral Oral Oral  SpO2: 93% 92% 97% 94%  Weight:      Height:        Intake/Output  Summary (Last 24 hours) at 07/19/2022 1051 Last data filed at 07/19/2022 0548 Gross per 24 hour  Intake 518.24 ml  Output 430 ml  Net 88.24 ml   Filed Weights   07/17/22 1721  Weight: 87.1 kg    Examination:  General exam: Appears calm and comfortable  Respiratory system: Clear to auscultation. Respiratory effort normal. Cardiovascular system: S1 & S2 heard, irregularly irregular and bradycardia. No JVD, murmurs, rubs, gallops or clicks. No pedal edema. Gastrointestinal system: Abdomen is nondistended, soft and nontender. No organomegaly or masses felt. Normal bowel sounds heard. Central nervous system: Alert and oriented. No focal neurological deficits. Extremities: Symmetric 5 x 5 power. Skin: No rashes, lesions or ulcers Psychiatry: Judgement and insight appear normal. Mood & affect appropriate.    Data Reviewed: I have personally reviewed following labs and imaging studies  CBC: Recent Labs  Lab 07/17/22 1752 07/18/22 0621 07/19/22 0147  WBC 4.9 4.4 4.6  NEUTROABS  --  2.2  --   HGB 12.6* 11.7* 11.7*  HCT 38.5* 35.0* 33.6*  MCV 98.7 96.7 93.9  PLT 115* 99* 94*   Basic Metabolic Panel: Recent Labs  Lab 07/17/22 1752 07/18/22 0621 07/19/22 0147  NA 140 142 139  K 3.9 3.3* 3.5  CL 102 107 105  CO2 '28 28 26  '$ GLUCOSE 112* 93 94  BUN 32* 29* 20  CREATININE 1.44* 1.22 1.31*  CALCIUM 9.6 9.1 8.8*  MG  --  1.6* 2.2   GFR: Estimated Creatinine Clearance: 41.1 mL/min (A) (by C-G formula based on SCr of 1.31 mg/dL (H)). Liver Function Tests: Recent Labs  Lab 07/18/22 0621  AST 29  ALT 16  ALKPHOS 72  BILITOT 0.6  PROT 5.9*  ALBUMIN 3.2*   No results for input(s): "LIPASE", "AMYLASE" in the last 168 hours. No results for input(s): "AMMONIA" in the last 168 hours. Coagulation Profile: No results for input(s): "INR", "PROTIME" in the last 168 hours. Cardiac Enzymes: No results for input(s): "CKTOTAL", "CKMB", "CKMBINDEX", "TROPONINI" in the last 168  hours. BNP (last 3 results) No results for input(s): "PROBNP" in the last 8760 hours. HbA1C: No results for input(s): "HGBA1C" in the last 72 hours. CBG: No results for input(s): "GLUCAP" in the last 168 hours. Lipid Profile: No results for input(s): "CHOL", "HDL", "LDLCALC", "TRIG", "CHOLHDL", "LDLDIRECT" in the last 72 hours. Thyroid Function Tests: Recent Labs    07/18/22 0621  TSH 3.604   Anemia Panel: No results for input(s): "VITAMINB12", "FOLATE", "FERRITIN", "TIBC", "IRON", "RETICCTPCT" in the last 72 hours. Sepsis Labs: No results for input(s): "PROCALCITON", "LATICACIDVEN" in the last 168 hours.  No results found for this or any previous visit (from the past 240 hour(s)).   Radiology Studies: DG Chest 2 View  Result Date: 07/17/2022 CLINICAL DATA:  Irregular heartbeat EXAM: CHEST - 2 VIEW COMPARISON:  Prior chest x-ray 11/05/2015 FINDINGS: The patient is rotated toward the right.  Grossly stable cardiac and mediastinal contours given degree of rotation. Atherosclerotic calcifications present in the transverse aorta. No significant pulmonary vascular congestion or evidence of pulmonary edema. Stable chronic bronchitic changes and interstitial prominence. No acute osseous abnormality. IMPRESSION: Grossly stable chest x-ray given rightward rotated position of the patient. No acute cardiopulmonary process. Aortic atherosclerosis and chronic bronchitic changes. Electronically Signed   By: Jacqulynn Cadet M.D.   On: 07/17/2022 17:45    Scheduled Meds:  ALPRAZolam  1 mg Oral TID   ARIPiprazole  2 mg Oral Daily   buPROPion  300 mg Oral Daily   mirtazapine  30 mg Oral QHS   Continuous Infusions:  heparin 900 Units/hr (07/19/22 0500)   lactated ringers 30 mL/hr at 07/19/22 5697     LOS: 2 days   Darliss Cheney, MD [facility]  07/19/2022, 10:51 AM   *Please note that this is a verbal dictation therefore any spelling or grammatical errors are due to the "[place]  One" system interpretation.  Please page via [place] and do not message via secure chat for urgent patient care matters. Secure chat can be used for non urgent patient care matters.  How to contact the [facility] Attending or Consulting provider [place] or covering provider during after hours [place], for this patient?  Check the care team in [facility] and look for a) attending/consulting [facility] provider listed and b) the [facility] team listed. Page or secure chat [phone]. Log into [email or web address] and use [place]'s universal password to access. If you do not have the password, please contact the hospital operator. Locate the [facility] provider you are looking for under [facility] and page to a number that you can be directly reached. If you still have difficulty reaching the provider, please page the [facility] (Director on Call) for the Hospitalists listed on amion for assistance.

## 2022-07-19 NOTE — Progress Notes (Addendum)
ANTICOAGULATION CONSULT NOTE - Initial Consult  Pharmacy Consult for Heparin  Indication: atrial fibrillation  Allergies  Allergen Reactions   Ferrous Sulfate Hives and Swelling    Patient doesn't recall.   Sulfa Antibiotics Hives and Swelling    Patient doesn't recall.    Patient Measurements: Height: 6' (182.9 cm) Weight: 87.1 kg (192 lb) IBW/kg (Calculated) : 77.6   Vital Signs: Temp: 97.9 F (36.6 C) (01/07 0546) Temp Source: Oral (01/07 0546) BP: 141/80 (01/07 0546) Pulse Rate: 55 (01/07 0546)  Labs: Recent Labs    07/17/22 1752 07/18/22 0621 07/19/22 0147 07/19/22 0557  HGB 12.6* 11.7* 11.7*  --   HCT 38.5* 35.0* 33.6*  --   PLT 115* 99* 94*  --   APTT  --   --  60* 77*  HEPARINUNFRC  --   --  0.56  --   CREATININE 1.44* 1.22 1.31*  --   TROPONINIHS 21*  --   --   --      Estimated Creatinine Clearance: 41.1 mL/min (A) (by C-G formula based on SCr of 1.31 mg/dL (H)).   Medical History: Past Medical History:  Diagnosis Date   Anxiety    Arthritis    Atrial fibrillation (HCC)    Depression    Dysrhythmia    atrial fib   GERD (gastroesophageal reflux disease)    no meds needed- diet controlled   Heart palpitations    Hypertension    Joint pain    Obesity       Assessment: 89yom with Hx Afib and slow ventricular response admitted for possible PPM. On Apixaban pt last dose 1/5 - hold for possible procedure and will bridge with heparin drip - no bolus. First aPTT drawn early, but repeat aPTT 77 within goal. Patient's Hgb stable around 11.7 and plts low within 90s. Per RN no issues with heparin drip overnight and no signs/symptoms of bleeding.  Goal of Therapy:  aPTT 66-103 seconds Monitor platelets by anticoagulation protocol: Yes   Plan:  Continue heparin drip 900 units/hr  Aptt and heparin level daily until levels correlate  F/u aPTT in 8 hours CBC daily  Monitor s/s bleeding   Sandford Craze, PharmD. [facility] Acute Care  PGY-1  07/19/2022 7:35 AM      ADDENDUM -Per discussion with primary team and EP, patient is not a good candidate for PPM. Will transition back to PTA eliquis for his atrial fibrillation.  Plan Stop heparin drip and restart eliquis '5mg'$  BID Continue to monitor CBC daily and for signs/symptoms of bleed Plan to discharge on 1/8.

## 2022-07-20 DIAGNOSIS — R001 Bradycardia, unspecified: Secondary | ICD-10-CM | POA: Diagnosis not present

## 2022-07-20 LAB — MAGNESIUM: Magnesium: 2.1 mg/dL (ref 1.7–2.4)

## 2022-07-20 LAB — BASIC METABOLIC PANEL
Anion gap: 10 (ref 5–15)
BUN: 24 mg/dL — ABNORMAL HIGH (ref 8–23)
CO2: 26 mmol/L (ref 22–32)
Calcium: 9.1 mg/dL (ref 8.9–10.3)
Chloride: 103 mmol/L (ref 98–111)
Creatinine, Ser: 1.52 mg/dL — ABNORMAL HIGH (ref 0.61–1.24)
GFR, Estimated: 43 mL/min — ABNORMAL LOW (ref >60)
Glucose, Bld: 110 mg/dL — ABNORMAL HIGH (ref 70–99)
Potassium: 4.1 mmol/L (ref 3.5–5.1)
Sodium: 139 mmol/L (ref 135–145)

## 2022-07-20 MED ORDER — PHENYLEPHRINE HCL-NACL 20-0.9 MG/250ML-% IV SOLN
INTRAVENOUS | Status: AC
  Filled 2022-07-20: qty 250

## 2022-07-20 NOTE — Discharge Summary (Signed)
Physician Discharge Summary  Randall Lara:[id number] DOB: [birth year] DOA: 07/17/2022  PCP: Randall Post, MD  Admit date: 07/17/2022 Discharge date: 07/20/2022 30 Day Unplanned Readmission Risk Score    Flowsheet Row ED to Hosp-Admission (Current) from 07/17/2022 in [facility]  30 Day Unplanned Readmission Risk Score (%) 15.57 Filed at 07/20/2022 0801       This score is the patient's risk of an unplanned readmission within 30 days of being discharged (0 -100%). The score is based on dignosis, age, lab data, medications, orders, and past utilization.   Low:  0-14.9   Medium: 15-21.9   High: 22-29.9   Extreme: 30 and above          Admitted From: Home Disposition: Home  Recommendations for Outpatient Follow-up:  Follow up with PCP in 1-2 weeks Please obtain BMP/CBC in one week Please follow up with your PCP on the following pending results: Unresulted Labs (From admission, onward)     Start     Ordered   07/19/22 0500  Heparin level (unfractionated)  Daily,   R      07/18/22 1908   07/19/22 0500  APTT  Daily,   R      07/18/22 [place]: Yes-but patient is declining Equipment/Devices: None  Discharge Condition: Stable CODE STATUS: Full code Diet recommendation: Cardiac  Subjective: Seen and went, before when I started talking, he says " I am ready to go home".  He has no complaints.  He is very impatient to go home.  Brief/Interim Summary: 89 y.o. male with medical history significant of anxiety, atrial fibrillation, depression, GERD, A-fib, and more presented to the ED with a chief complaint of palpitations.  Patient reports he has had palpitations that started last night.  It lasted 3-5 hours last night.  They seem to go away when he took a Xanax.  They are worse with even minimal exertion.  He has no associated dyspnea, chest pain, dizziness.  He is not on any new medications.  He has otherwise been in his normal state of  health.  He did see cardiology in office, he was found to have bradycardia, and they recommended him to come to the ER for admission for possible pacemaker placement.   Cardiology note recommends discontinuing hydrochlorothiazide.  He was transferred to [facility] for EP evaluation.  Details   Symptomatic bradycardia TSH within normal limit.  patient is noted to have occasional bradycardia on telemetry but no prolonged pauses. He is not having episodes of presyncope or syncope. Avoid AV nodal blocking agents. He has been seen by Dr. Quentin Ore and conservative measures felt warranted at this point. Pacemaker not planned.   severe aortic stenosis-noted previously.  He declined TAVR at that point.  Follow-up with Dr. Dellia Cloud after discharge.    Hypokalemia Resolved.   Hypomagnesemia Resolved.   CAD (coronary artery disease) - Held ACE by previous hospitalist due to AKI and soft BPs, however on my evaluation, patient's blood pressure is very labile but low this morning so I will continue to hold but patient does not appear to have AKI.  AKI ruled out.    CKD stage IIIa: Baseline around 1.3, creatinine is 1.5, just over his baseline but does not qualify for AKI.   Depression, major, single episode, severe (HCC) - Continue Wellbutrin, and Abilify   History of atrial fibrillation - Resume apixaban.  Essential hypertension - Cardiology note recommends holding hydrochlorothiazide, BPs are soft; discontinuing ACEI as well now that his creatinine is slightly elevated as well.   Anxiety state - Continue Xanax   Chronic thrombocytopenia: Stable.  Discharge plan was discussed with patient and/or family member and they verbalized understanding and agreed with it.  Discharge Diagnoses:  Principal Problem:   Symptomatic bradycardia Active Problems:   Anxiety state   Essential hypertension   History of atrial fibrillation   Depression, major, single episode, severe (HCC)   CAD  (coronary artery disease)   Hypomagnesemia   Hypokalemia    Discharge Instructions   Allergies as of 07/20/2022       Reactions   Ferrous Sulfate Hives, Swelling   Patient doesn't recall.   Sulfa Antibiotics Hives, Swelling   Patient doesn't recall.        Medication List     STOP taking these medications    benazepril-hydrochlorthiazide 20-12.5 MG tablet Commonly known as: LOTENSIN HCT   furosemide 20 MG tablet Commonly known as: LASIX   nystatin-triamcinolone ointment Commonly known as: MYCOLOG       TAKE these medications    ALPRAZolam 1 MG tablet Commonly known as: XANAX TAKE ONE TABLET BY MOUTH THREE TIMES DAILY   ARIPiprazole 2 MG tablet Commonly known as: ABILIFY Take 1 tablet (2 mg total) by mouth daily.   buPROPion 300 MG 24 hr tablet Commonly known as: WELLBUTRIN XL Take 1 tablet (300 mg total) by mouth daily.   Eliquis 5 MG Tabs tablet Generic drug: apixaban TAKE ONE TABLET BY MOUTH TWICE DAILY   mirtazapine 30 MG disintegrating tablet Commonly known as: REMERON SOL-TAB DISSOLVE ONE TABLET BY MOUTH AT BEDTIME   nitroGLYCERIN 0.4 MG SL tablet Commonly known as: NITROSTAT DISSOLVE ONE TABLET UNDER TONGUE EVERY 5 MINUTES UP TO 3 DOSES AS NEEDED FOR CHEST PAIN. DO NOT EXCEED A TOTAL OF 3 DOSES IN 15 MINUTES.        Follow-up Information     Randall Post, MD Follow up in 1 week(s).   Specialty: Family Medicine Contact information: [place] [address] [phone]         Chalmers Guest, MD Follow up in 2 week(s).   Specialties: Cardiology, Internal Medicine Contact information: [address] [place] [address] [phone]                Allergies  Allergen Reactions   Ferrous Sulfate Hives and Swelling    Patient doesn't recall.   Sulfa Antibiotics Hives and Swelling    Patient doesn't recall.    Consultations: Cardiology   Procedures/Studies: DG Chest 2 View  Result  Date: 07/17/2022 CLINICAL DATA:  Irregular heartbeat EXAM: CHEST - 2 VIEW COMPARISON:  Prior chest x-ray 11/05/2015 FINDINGS: The patient is rotated toward the right. Grossly stable cardiac and mediastinal contours given degree of rotation. Atherosclerotic calcifications present in the transverse aorta. No significant pulmonary vascular congestion or evidence of pulmonary edema. Stable chronic bronchitic changes and interstitial prominence. No acute osseous abnormality. IMPRESSION: Grossly stable chest x-ray given rightward rotated position of the patient. No acute cardiopulmonary process. Aortic atherosclerosis and chronic bronchitic changes. Electronically Signed   By: Jacqulynn Cadet M.D.   On: 07/17/2022 17:45     Discharge Exam: Vitals:   07/20/22 0618 07/20/22 0900  BP: (!) 130/97 128/62  Pulse: (!) 43 87  Resp: 16 16  Temp: 97.6 F (36.4 C) 98 F (36.7 C)  SpO2: 100% 93%   Vitals:   07/19/22 1715 07/19/22 2148 07/20/22 0618 07/20/22 0900  BP: 123/69 116/61 (!) 130/97 128/62  Pulse: (!) 37 (!) 41 (!) 43 87  Resp: '16 18 16 16  '$ Temp: 97.6 F (36.4 C) 97.6 F (36.4 C) 97.6 F (36.4 C) 98 F (36.7 C)  TempSrc: Oral Oral Oral Oral  SpO2: 93% 98% 100% 93%  Weight:      Height:        General: Pt is alert, awake, not in acute distress Cardiovascular: Irregularly irregular RR with slow response, S1/S2 +, no rubs, no gallops Respiratory: CTA bilaterally, no wheezing, no rhonchi Abdominal: Soft, NT, ND, bowel sounds + Extremities: no edema, no cyanosis    The results of significant diagnostics from this hospitalization (including imaging, microbiology, ancillary and laboratory) are listed below for reference.     Microbiology: No results found for this or any previous visit (from the past 240 hour(s)).   Labs: BNP (last 3 results) No results for input(s): "BNP" in the last 8760 hours. Basic Metabolic Panel: Recent Labs  Lab 07/17/22 1752 07/18/22 0621 07/19/22 0147  07/20/22 0823  NA 140 142 139 139  K 3.9 3.3* 3.5 4.1  CL 102 107 105 103  CO2 '28 28 26 26  '$ GLUCOSE 112* 93 94 110*  BUN 32* 29* 20 24*  CREATININE 1.44* 1.22 1.31* 1.52*  CALCIUM 9.6 9.1 8.8* 9.1  MG  --  1.6* 2.2 2.1   Liver Function Tests: Recent Labs  Lab 07/18/22 0621  AST 29  ALT 16  ALKPHOS 72  BILITOT 0.6  PROT 5.9*  ALBUMIN 3.2*   No results for input(s): "LIPASE", "AMYLASE" in the last 168 hours. No results for input(s): "AMMONIA" in the last 168 hours. CBC: Recent Labs  Lab 07/17/22 1752 07/18/22 0621 07/19/22 0147  WBC 4.9 4.4 4.6  NEUTROABS  --  2.2  --   HGB 12.6* 11.7* 11.7*  HCT 38.5* 35.0* 33.6*  MCV 98.7 96.7 93.9  PLT 115* 99* 94*   Cardiac Enzymes: No results for input(s): "CKTOTAL", "CKMB", "CKMBINDEX", "TROPONINI" in the last 168 hours. BNP: Invalid input(s): "POCBNP" CBG: No results for input(s): "GLUCAP" in the last 168 hours. D-Dimer No results for input(s): "DDIMER" in the last 72 hours. Hgb A1c No results for input(s): "HGBA1C" in the last 72 hours. Lipid Profile No results for input(s): "CHOL", "HDL", "LDLCALC", "TRIG", "CHOLHDL", "LDLDIRECT" in the last 72 hours. Thyroid function studies Recent Labs    07/18/22 0621  TSH 3.604   Anemia work up No results for input(s): "VITAMINB12", "FOLATE", "FERRITIN", "TIBC", "IRON", "RETICCTPCT" in the last 72 hours. Urinalysis    Component Value Date/Time   COLORURINE YELLOW 11/05/2015 0905   APPEARANCEUR CLOUDY (A) 11/05/2015 0905   LABSPEC 1.021 11/05/2015 0905   PHURINE 5.0 11/05/2015 0905   GLUCOSEU NEGATIVE 11/05/2015 0905   HGBUR NEGATIVE 11/05/2015 0905   BILIRUBINUR negative 08/11/2016 1337   KETONESUR NEGATIVE 11/05/2015 0905   PROTEINUR negative 08/11/2016 1337   PROTEINUR NEGATIVE 11/05/2015 0905   UROBILINOGEN 1.0 08/11/2016 1337   NITRITE negative 08/11/2016 1337   NITRITE NEGATIVE 11/05/2015 0905   LEUKOCYTESUR Negative 08/11/2016 1337   Sepsis Labs Recent Labs   Lab 07/17/22 1752 07/18/22 0621 07/19/22 0147  WBC 4.9 4.4 4.6   Microbiology No results found for this or any previous visit (from the past 240 hour(s)).   Time coordinating discharge: Over 30 minutes  SIGNED:   Darliss Cheney, MD  [facility] 07/20/2022, 10:32 AM *Please note that this is a verbal dictation therefore any spelling or grammatical errors are due to the "[place] [employer]" system interpretation. If 7PM-7AM, please contact night-coverage [email or web address]

## 2022-07-20 NOTE — Progress Notes (Signed)
Rounding Note    Patient Name: Randall Lara Date of Encounter: 07/20/2022  [place] Cardiologist: Dr Dellia Cloud  Subjective   No CP or dyspnea  Inpatient Medications    Scheduled Meds:  ALPRAZolam  1 mg Oral TID   apixaban  5 mg Oral BID   ARIPiprazole  2 mg Oral Daily   buPROPion  300 mg Oral Daily   mirtazapine  30 mg Oral QHS   Continuous Infusions:  lactated ringers 30 mL/hr at 07/20/22 0341   PRN Meds: acetaminophen **OR** acetaminophen, hydrALAZINE, morphine injection, ondansetron **OR** ondansetron (ZOFRAN) IV, oxyCODONE   Vital Signs    Vitals:   07/19/22 1232 07/19/22 1715 07/19/22 2148 07/20/22 0618  BP: 124/65 123/69 116/61 (!) 130/97  Pulse: (!) 44 (!) 37 (!) 41 (!) 43  Resp: '16 16 18 16  '$ Temp: (!) 97.5 F (36.4 C) 97.6 F (36.4 C) 97.6 F (36.4 C) 97.6 F (36.4 C)  TempSrc: Oral Oral Oral Oral  SpO2: 98% 93% 98% 100%  Weight:      Height:        Intake/Output Summary (Last 24 hours) at 07/20/2022 0844 Last data filed at 07/20/2022 0341 Gross per 24 hour  Intake 1241.01 ml  Output 275 ml  Net 966.01 ml      07/17/2022    5:21 PM 07/17/2022    3:32 PM 05/04/2022    3:33 PM  Last 3 Weights  Weight (lbs) 192 lb 192 lb 9.6 oz 196 lb 3.2 oz  Weight (kg) 87.091 kg 87.363 kg 88.996 kg      Telemetry    Atrial fibrillation with PVCs and NSVT: No prolonged pauses- Personally Reviewed   Physical Exam   GEN: No acute distress.   Neck: No JVD Cardiac: irregular; 3/6 systolic murmur Respiratory: Clear to auscultation bilaterally. GI: Soft, nontender, non-distended  MS: No edema Neuro:  Nonfocal  Psych: Normal affect   Labs    High Sensitivity Troponin:   Recent Labs  Lab 07/17/22 1752  TROPONINIHS 21*     Chemistry Recent Labs  Lab 07/17/22 1752 07/18/22 0621 07/19/22 0147  NA 140 142 139  K 3.9 3.3* 3.5  CL 102 107 105  CO2 '28 28 26  '$ GLUCOSE 112* 93 94  BUN 32* 29* 20  CREATININE 1.44* 1.22 1.31*  CALCIUM 9.6  9.1 8.8*  MG  --  1.6* 2.2  PROT  --  5.9*  --   ALBUMIN  --  3.2*  --   AST  --  29  --   ALT  --  16  --   ALKPHOS  --  72  --   BILITOT  --  0.6  --   GFRNONAA 46* 56* 52*  ANIONGAP '10 7 8     '$ Hematology Recent Labs  Lab 07/17/22 1752 07/18/22 0621 07/19/22 0147  WBC 4.9 4.4 4.6  RBC 3.90* 3.62* 3.58*  HGB 12.6* 11.7* 11.7*  HCT 38.5* 35.0* 33.6*  MCV 98.7 96.7 93.9  MCH 32.3 32.3 32.7  MCHC 32.7 33.4 34.8  RDW 13.8 13.5 13.3  PLT 115* 99* 94*   Thyroid  Recent Labs  Lab 07/18/22 0621  TSH 3.604    Patient Profile     89 y.o. male with past medical history of permanent atrial fibrillation, severe aortic stenosis (declined TAVR), coronary artery disease, chronic diastolic congestive heart failure, hypertension managed medically, dementia being evaluated for bradycardia.  Last echocardiogram February 2023 showed normal LV  function, moderate left ventricular hypertrophy, moderate left atrial enlargement, low-flow aortic stenosis (mean gradient 26 mmHg, aortic valve area 0.8 cm, dimensionless index 0.24; consistent with severe).  Assessment & Plan    1 bradycardia-patient is noted to have occasional bradycardia on telemetry but no prolonged pauses.  He is not having episodes of presyncope or syncope.  Avoid AV nodal blocking agents.  He has been seen by Dr. Quentin Ore and conservative measures felt warranted at this point.  Pacemaker not planned.  2 permanent atrial fibrillation-continue apixaban.  3 severe aortic stenosis-noted previously.  He declined TAVR at that point.  Follow-up with Dr. Dellia Cloud after discharge.  4 hypertension-patient's blood pressure has been reasonable during this admission.  Would continue off of medical therapy and follow as an outpatient.  5 coronary artery disease-would add Crestor 20 mg daily at discharge.  Check lipids and liver in 8 weeks.  Cardiology will sign off.  Please call with questions.  We will arrange outpatient follow-up.   Continue present medications.   For questions or updates, please contact [place] Please consult [email or web address] for contact info under        Signed, Kirk Ruths, MD  07/20/2022, 8:44 AM

## 2022-07-20 NOTE — Care Management Important Message (Signed)
Important Message  Patient Details  Name: Randall Lara MRN: [id number] Date of Birth: December 15, 1929   Medicare Important Message Given:  Yes     [name] 07/20/2022, 8:03 AM

## 2022-07-20 NOTE — Evaluation (Signed)
Physical Therapy Evaluation Patient Details Name: Randall Lara MRN: [id number] DOB: [birth year] Today's Date: 07/20/2022  History of Present Illness  89 y.o. male presents to [facility] hospital on 07/17/2022 with complaints of chest palpitations, pt was seen earlier in the day at his cardiologist who recommended possible pacemaker placement. PMH includes anxiety, afib, depression, GERD.  Clinical Impression  Pt presents to PT with deficits in gait, strength, power, endurance, balance. Pt ambulates with slowed shuffling steps, reaching for furniture or railings when available in the hallway. Pt reports his legs fatigue quickly at times, more so in the morning until he loosens up more. PT provides recommendation for the pt to utilize his cane when ambulating, especially in the mornings, to improve stability and reduce falls risk. The pt does not seem to feel the use of DME is necessary, he also declines the need for follow-up PT services despite PT recommendation.     Recommendations for follow up therapy are one component of a multi-disciplinary discharge planning process, led by the attending physician.  Recommendations may be updated based on patient status, additional functional criteria and insurance authorization.  Follow Up Recommendations Home health PT (pt declining at this time)      Assistance Recommended at Discharge Intermittent Supervision/Assistance  Patient can return home with the following  A little help with bathing/dressing/bathroom;Assistance with cooking/housework;Help with stairs or ramp for entrance    Equipment Recommendations None recommended by PT (pt owns necessary DME)  Recommendations for Other Services       Functional Status Assessment Patient has had a recent decline in their functional status and demonstrates the ability to make significant improvements in function in a reasonable and predictable amount of time.     Precautions / Restrictions Precautions Precautions:  Fall Restrictions Weight Bearing Restrictions: No      Mobility  Bed Mobility Overal bed mobility: Modified Independent             General bed mobility comments: increased time    Transfers Overall transfer level: Needs assistance Equipment used: None Transfers: Sit to/from Stand Sit to Stand: Supervision                Ambulation/Gait Ambulation/Gait assistance: Supervision Gait Distance (Feet): 80 Feet Assistive device:  (PRN use of railing/furniture) Gait Pattern/deviations: Shuffle Gait velocity: reduced Gait velocity interpretation: <1.31 ft/sec, indicative of household ambulator   General Gait Details: pt with slowed shuffling steps, mild anterior lean although no LOB noted  Stairs            Wheelchair Mobility    Modified Rankin (Stroke Patients Only)       Balance Overall balance assessment: Needs assistance Sitting-balance support: No upper extremity supported, Feet supported Sitting balance-Leahy Scale: Good     Standing balance support: No upper extremity supported, During functional activity Standing balance-Leahy Scale: Fair                               Pertinent Vitals/Pain Pain Assessment Pain Assessment: No/denies pain    Home Living Family/patient expects to be discharged to:: Private residence Living Arrangements: Spouse/significant other Available Help at Discharge: Personal care attendant (8-1 weekdays) Type of Home: House Home Access: Stairs to enter Entrance Stairs-Rails: None Entrance Stairs-Number of Steps: 1   Home Layout: One level Home Equipment: Conservation officer, nature (2 wheels);Cane - single point      Prior Function Prior Level of Function : Independent/Modified Independent;Driving  Mobility Comments: assists in caring for his spouse who has dementia       Hand Dominance        Extremity/Trunk Assessment   Upper Extremity Assessment Upper Extremity Assessment: Defer to  OT evaluation    Lower Extremity Assessment Lower Extremity Assessment: Generalized weakness    Cervical / Trunk Assessment Cervical / Trunk Assessment: Kyphotic  Communication   Communication: No difficulties  Cognition Arousal/Alertness: Awake/alert Behavior During Therapy: Impulsive Overall Cognitive Status: Within Functional Limits for tasks assessed                                 General Comments: likely at baseline, some reduced insight into safety        General Comments General comments (skin integrity, edema, etc.): VSS on RA    Exercises     Assessment/Plan    PT Assessment Patient needs continued PT services  PT Problem List Decreased strength;Decreased activity tolerance;Decreased balance;Decreased mobility;Decreased knowledge of use of DME;Decreased safety awareness       PT Treatment Interventions DME instruction;Gait training;Stair training;Functional mobility training;Therapeutic activities;Therapeutic exercise;Balance training;Patient/family education;Neuromuscular re-education    PT Goals (Current goals can be found in the Care Plan section)  Acute Rehab PT Goals Patient Stated Goal: to go home PT Goal Formulation: With patient Time For Goal Achievement: 08/03/22 Potential to Achieve Goals: Good Additional Goals Additional Goal #1: Pt will score >45/56 on the BERG to indicate a reduced risk for falls    Frequency Min 3X/week     Co-evaluation               AM-PAC PT "6 Clicks" Mobility  Outcome Measure Help needed turning from your back to your side while in a flat bed without using bedrails?: None Help needed moving from lying on your back to sitting on the side of a flat bed without using bedrails?: None Help needed moving to and from a bed to a chair (including a wheelchair)?: A Little Help needed standing up from a chair using your arms (e.g., wheelchair or bedside chair)?: A Little Help needed to walk in hospital  room?: A Little Help needed climbing 3-5 steps with a railing? : A Little 6 Click Score: 20    End of Session   Activity Tolerance: Patient tolerated treatment well Patient left: in bed;with call bell/phone within reach;with bed alarm set Nurse Communication: Mobility status PT Visit Diagnosis: Other abnormalities of gait and mobility (R26.89);Muscle weakness (generalized) (M62.81)    Time: 7737-3668 PT Time Calculation (min) (ACUTE ONLY): 10 min   Charges:   PT Evaluation $PT Eval Low Complexity: 1 Low          Zenaida Niece, PT, DPT Acute Rehabilitation Office [phone]   Zenaida Niece 07/20/2022, 8:35 AM

## 2022-07-20 NOTE — TOC Transition Note (Signed)
Transition of Care [facility]) - CM/SW Discharge Note   Patient Details  Name: Randall Lara MRN: [id number] Date of Birth: 03/06/1930  [facility] CM/SW Contact:  Levonne Lapping, RN Phone Number: 07/20/2022, 11:21 AM   Clinical Narrative:    CM met with Patient bedside to revisit recommendation for [facility] PT. Patient declined stating he did not want or think he needed it. CM explained that PT was recommending it but that he did have the right to refuse. Patient confirming refusal of Home Health PT.  Patient will be transported home with his Brother in [place].  No additional TOC needs    Final next level of care: Home/Self Care Barriers to Discharge: No Barriers Identified   Patient Goals and CMS Choice CMS Medicare.gov Compare Post Acute Care list provided to:: Other (Comment Required) (Patient declining information and home health physical therapy) Choice offered to / list presented to : NA  Discharge Placement  Home  Dec lined recommended [place] PT                        Discharge Plan and Services Additional resources added to the After Visit Summary for   In-house Referral: NA Discharge Planning Services: CM Consult Post Acute Care Choice: NA          DME Arranged: N/A DME Agency: NA       HH Arranged: NA HH Agency: NA        Social Determinants of Health (SDOH) Interventions SDOH Screenings   Food Insecurity: No Food Insecurity (07/18/2022)  Housing: Low Risk  (07/18/2022)  Transportation Needs: No Transportation Needs (07/18/2022)  Utilities: Not At Risk (07/18/2022)  Alcohol Screen: Low Risk  (10/02/2020)  Depression (PHQ2-9): Low Risk  (11/24/2021)  Financial Resource Strain: Low Risk  (10/02/2020)  Physical Activity: Inactive (10/02/2020)  Social Connections: Moderately Isolated (10/02/2020)  Stress: No Stress Concern Present (10/02/2020)  Tobacco Use: Medium Risk (07/17/2022)     Readmission Risk Interventions     No data to display

## 2022-07-20 NOTE — Evaluation (Signed)
Occupational Therapy Evaluation Patient Details Name: Randall Lara MRN: [id number] DOB: Apr 14, 1930 Today's Date: 07/20/2022   History of Present Illness 89 y.o. male presents to [facility] hospital on 07/17/2022 with complaints of chest palpitations, pt was seen earlier in the day at his cardiologist who recommended possible pacemaker placement. PMH includes anxiety, afib, depression, GERD.   Clinical Impression   Quintavious was evaluated s/p the above admission list, he is typically indep at baseline including caring for his wife, driving and managing household IADLs. Upon evaluation pt demonstrated limitations in impulsivity, limited safety awareness, unsteady balance during standing ADLs, decreased activity tolerance and generalized weakness. Overall pt was min G for mobility and LB ADLs without AD, he did required increased time and effort to complete tasks. For UB ADLs he was set up in sitting. Educated pt on use of SPC to decrease fall risk, as well as follow up therapies; pt politely declining. OT to continue to follow acutely. Recommend home with support of family.      Recommendations for follow up therapy are one component of a multi-disciplinary discharge planning process, led by the attending physician.  Recommendations may be updated based on patient status, additional functional criteria and insurance authorization.   Follow Up Recommendations  No OT follow up (pt declining follow up rehab)     Assistance Recommended at Discharge Frequent or constant Supervision/Assistance  Patient can return home with the following A little help with walking and/or transfers;A little help with bathing/dressing/bathroom;Assistance with cooking/housework;Help with stairs or ramp for entrance;Assist for transportation    Functional Status Assessment  Patient has had a recent decline in their functional status and demonstrates the ability to make significant improvements in function in a reasonable and predictable  amount of time.  Equipment Recommendations  None recommended by OT       Precautions / Restrictions Precautions Precautions: Fall Restrictions Weight Bearing Restrictions: No      Mobility Bed Mobility Overal bed mobility: Modified Independent             General bed mobility comments: increased time    Transfers Overall transfer level: Needs assistance Equipment used: None Transfers: Sit to/from Stand Sit to Stand: Min guard                  Balance Overall balance assessment: Needs assistance Sitting-balance support: No upper extremity supported, Feet supported Sitting balance-Leahy Scale: Good     Standing balance support: No upper extremity supported, During functional activity Standing balance-Leahy Scale: Fair                             ADL either performed or assessed with clinical judgement   ADL Overall ADL's : Needs assistance/impaired Eating/Feeding: Independent;Sitting   Grooming: Min guard;Standing   Upper Body Bathing: Set up;Sitting   Lower Body Bathing: Min guard;Sit to/from stand   Upper Body Dressing : Set up;Sitting   Lower Body Dressing: Min guard;Sit to/from stand   Toilet Transfer: Min guard;Ambulation   Toileting- Clothing Manipulation and Hygiene: Supervision/safety;Sitting/lateral lean       Functional mobility during ADLs: Min guard General ADL Comments: no AD, pt declined. increased time and effort needed for LB tasks. mildly impuslive with decreased insight to safety     Vision Baseline Vision/History: 1 Wears glasses Vision Assessment?: No apparent visual deficits     Perception Perception Perception Tested?: No   Praxis Praxis Praxis tested?: Not tested  Pertinent Vitals/Pain Pain Assessment Pain Assessment: No/denies pain     Hand Dominance Right   Extremity/Trunk Assessment Upper Extremity Assessment Upper Extremity Assessment: Generalized weakness   Lower Extremity  Assessment Lower Extremity Assessment: Defer to PT evaluation   Cervical / Trunk Assessment Cervical / Trunk Assessment: Kyphotic   Communication Communication Communication: No difficulties;HOH (has hearing aids)   Cognition Arousal/Alertness: Awake/alert Behavior During Therapy: Impulsive Overall Cognitive Status: Within Functional Limits for tasks assessed                                 General Comments: likely at baseline, some reduced insight into safety     General Comments  VSS on RA     Home Living Family/patient expects to be discharged to:: Private residence Living Arrangements: Spouse/significant other Available Help at Discharge: Personal care attendant Type of Home: House Home Access: Stairs to enter Technical brewer of Steps: 1 Entrance Stairs-Rails: None Home Layout: One level     Bathroom Shower/Tub: Occupational psychologist: Handicapped height     Home Equipment: Conservation officer, nature (2 wheels);Cane - single point          Prior Functioning/Environment Prior Level of Function : Independent/Modified Independent;Driving             Mobility Comments: assists in caring for his spouse who has dementia ADLs Comments: indep, drives        OT Problem List: Decreased activity tolerance;Impaired balance (sitting and/or standing);Decreased safety awareness;Cardiopulmonary status limiting activity      OT Treatment/Interventions: Self-care/ADL training;Therapeutic exercise;DME and/or AE instruction;Energy conservation;Therapeutic activities;Patient/family education;Balance training    OT Goals(Current goals can be found in the care plan section) Acute Rehab OT Goals Patient Stated Goal: home asap OT Goal Formulation: With patient Time For Goal Achievement: 08/03/22 Potential to Achieve Goals: Good ADL Goals Additional ADL Goal #1: Pt will complete all BADLs with mod I  OT Frequency: Min 2X/week       AM-PAC OT "6  Clicks" Daily Activity     Outcome Measure Help from another person eating meals?: None Help from another person taking care of personal grooming?: A Little Help from another person toileting, which includes using toliet, bedpan, or urinal?: A Little Help from another person bathing (including washing, rinsing, drying)?: A Little Help from another person to put on and taking off regular upper body clothing?: None Help from another person to put on and taking off regular lower body clothing?: A Little 6 Click Score: 20   End of Session Nurse Communication: Mobility status  Activity Tolerance: Patient tolerated treatment well Patient left: in bed;with call bell/phone within reach;with bed alarm set  OT Visit Diagnosis: Unsteadiness on feet (R26.81);Muscle weakness (generalized) (M62.81)                Time: 5035-4656 OT Time Calculation (min): 15 min Charges:  OT General Charges $OT Visit: 1 Visit OT Evaluation $OT Eval Moderate Complexity: 1 Mod   Prue Lingenfelter D Causey 07/20/2022, 9:09 AM

## 2022-07-21 ENCOUNTER — Ambulatory Visit (INDEPENDENT_AMBULATORY_CARE_PROVIDER_SITE_OTHER): Payer: Medicare Other | Admitting: Family Medicine

## 2022-07-21 ENCOUNTER — Encounter: Payer: Self-pay | Admitting: Family Medicine

## 2022-07-21 ENCOUNTER — Telehealth: Payer: Self-pay

## 2022-07-21 VITALS — BP 124/66 | HR 40 | Temp 97.6°F | Ht 72.0 in | Wt 193.3 lb

## 2022-07-21 DIAGNOSIS — F339 Major depressive disorder, recurrent, unspecified: Secondary | ICD-10-CM

## 2022-07-21 DIAGNOSIS — F411 Generalized anxiety disorder: Secondary | ICD-10-CM | POA: Diagnosis not present

## 2022-07-21 DIAGNOSIS — R001 Bradycardia, unspecified: Secondary | ICD-10-CM

## 2022-07-21 DIAGNOSIS — I10 Essential (primary) hypertension: Secondary | ICD-10-CM

## 2022-07-21 DIAGNOSIS — Z8679 Personal history of other diseases of the circulatory system: Secondary | ICD-10-CM | POA: Diagnosis not present

## 2022-07-21 MED ORDER — APIXABAN 5 MG PO TABS: 5.0000 mg | ORAL_TABLET | Freq: Two times a day (BID) | ORAL | 0 refills | Status: DC

## 2022-07-21 NOTE — Progress Notes (Signed)
Established Patient Office Visit  Subjective   Patient ID: Randall Lara, male    DOB: 23-Dec-1929  Age: 89 y.o. MRN: [id number]  Chief Complaint  Patient presents with   Hospitalization Follow-up    HPI   Randall Lara is seen for hospital follow-up.  He has chronic problems including history of hypertension, aortic stenosis, CAD, osteoarthritis, atrial fibrillation, recurrent depression, severe chronic anxiety.  He had been seen by his cardiologist and was noted to be bradycardic.  He was referred to the hospital initially at [facility] and then apparently transferred for consideration for pacemaker.  Was seen in consultation by EP specialist who did not feel pacemaker was indicated.  He was taken off of Lotensin HCTZ and furosemide.  His blood pressures have been stable since then.  He denies any recent syncope or dizziness.  He states his baseline heart rate by his monitor is usually about 45-50.  He remains on Eliquis.  He takes several antidepressant medications.  No recent chest pains.    He apparently has declined TAVR procedure in the past for his aortic stenosis.  Past Medical History:  Diagnosis Date   Anxiety    Arthritis    Atrial fibrillation (HCC)    Depression    Dysrhythmia    atrial fib   GERD (gastroesophageal reflux disease)    no meds needed- diet controlled   Heart palpitations    Hypertension    Joint pain    Obesity    Past Surgical History:  Procedure Laterality Date   CATARACT EXTRACTION Left    CATARACT EXTRACTION W/PHACO Right 03/05/2016   Procedure: CATARACT EXTRACTION PHACO AND INTRAOCULAR LENS PLACEMENT ; CDE:  11.47;  Surgeon: Tonny Branch, MD;  Location: [facility];  Service: Ophthalmology;  Laterality: Right;   ESOPHAGOGASTRODUODENOSCOPY  03/29/2002   [facility]:NOBSJG esophagus, stomach, and duodenum through the second  portion.   hernia repair Bilateral    Inguinal   RIGHT/LEFT HEART CATH AND CORONARY ANGIOGRAPHY N/A 09/19/2021   Procedure: RIGHT/LEFT HEART  CATH AND CORONARY ANGIOGRAPHY;  Surgeon: Burnell Blanks, MD;  Location: [facility];  Service: Cardiovascular;  Laterality: N/A;   TOTAL KNEE ARTHROPLASTY Right 11/13/2015   Procedure: TOTAL KNEE ARTHROPLASTY;  Surgeon: Latanya Maudlin, MD;  Location: [facility];  Service: Orthopedics;  Laterality: Right;    reports that he quit smoking about 46 years ago. His smoking use included cigarettes. He started smoking about 67 years ago. He has a 20.00 pack-year smoking history. He has never used smokeless tobacco. He reports that he does not currently use alcohol. He reports that he does not use drugs. family history includes Heart attack in his brother; Hypertension in his mother and sister; Stroke in his father, mother, and sister. Allergies  Allergen Reactions   Ferrous Sulfate Hives and Swelling    Patient doesn't recall.   Sulfa Antibiotics Hives and Swelling    Patient doesn't recall.    Review of Systems  Constitutional:  Negative for malaise/fatigue.  Eyes:  Negative for blurred vision.  Respiratory:  Negative for shortness of breath.   Cardiovascular:  Negative for chest pain.  Neurological:  Negative for dizziness, weakness and headaches.      Objective:     BP 124/66 (BP Location: Left Arm, Patient Position: Sitting, Cuff Size: Normal)   Pulse (!) 40   Temp 97.6 F (36.4 C) (Oral)   Ht 6' (1.829 m)   Wt 193 lb 4.8 oz (87.7 kg)  SpO2 98%   BMI 26.22 kg/m  BP Readings from Last 3 Encounters:  07/21/22 124/66  07/20/22 128/62  07/17/22 100/60   Wt Readings from Last 3 Encounters:  07/21/22 193 lb 4.8 oz (87.7 kg)  07/17/22 192 lb (87.1 kg)  07/17/22 192 lb 9.6 oz (87.4 kg)      Physical Exam Vitals reviewed.  Constitutional:      Appearance: He is well-developed.  Eyes:     Pupils: Pupils are equal, round, and reactive to light.  Neck:     Thyroid: No thyromegaly.  Cardiovascular:     Comments: Bradycardic with palpated rate around  48-50. Pulmonary:     Effort: Pulmonary effort is normal. No respiratory distress.     Breath sounds: Normal breath sounds. No wheezing or rales.  Musculoskeletal:     Cervical back: Neck supple.     Right lower leg: No edema.     Left lower leg: No edema.  Neurological:     Mental Status: He is alert and oriented to person, place, and time.      No results found for any visits on 07/21/22.    The ASCVD Risk score (Arnett DK, et al., 2019) failed to calculate for the following reasons:   The 2019 ASCVD risk score is only valid for ages 52 to 73    Assessment & Plan:   #1 bradycardia.  Decision was made not to pursue pacemaker.  He denies any dizziness or weakness and syncope.  Continue close follow-up with cardiology.  Avoid AV nodal blocking agents.  He is currently not on any blood pressure medications.  #2 history of atrial fibrillation.  Patient on Eliquis.  We did provide further samples of Eliquis at his request.  Continue Eliquis twice daily  #3 history of recurrent depression and anxiety currently stable on multidrug regimen.  #4 chronic kidney disease -stable by recent labs.  #5 past history of hypertension.  Currently stable off ACE inhibitor and thiazide.  Continue to monitor.  Carolann Littler, MD

## 2022-07-21 NOTE — Patient Outreach (Signed)
  Care Coordination TOC Note Transition Care Management Follow-up Telephone Call Date of discharge and from where: 07/20/22-[facility]  DX: "bradycardia" How have you been since you were released from the hospital? Patient reports he is doing fair. He is currently lying down as his arthritis has been bothering his legs. He did not take anything and voices he will "just tough it out." He denies any cardiac issues since returning home. He does report that he was "badly constipated" when he got home. He took some Milk of Magnesium last evening and had a good BM. Any questions or concerns? No  Items Reviewed: Did the pt receive and understand the discharge instructions provided? Yes  Medications obtained and verified?  Patient reports niece manages meds and not in the home with him at present Other? No  Any new allergies since your discharge? No  Dietary orders reviewed? Yes Do you have support at home? Yes -niece stays behind him and assists him  Home Care and Equipment/Supplies: Were home health services ordered? not applicable If so, what is the name of the agency? N/A  Has the agency set up a time to come to the patient's home? not applicable Were any new equipment or medical supplies ordered?  No What is the name of the medical supply agency? N/A Were you able to get the supplies/equipment? not applicable Do you have any questions related to the use of the equipment or supplies? No  Functional Questionnaire: (I = Independent and D = Dependent) ADLs: A  Bathing/Dressing- I  Meal Prep- A  Eating- I  Maintaining continence- I  Transferring/Ambulation- I  Managing Meds- A  Follow up appointments reviewed:  PCP Hospital f/u appt confirmed? Yes  Scheduled to see Dr. Elease Hashimoto today @ 3:30 pm. [facility] f/u appt confirmed? No -Patient states he will have niece arrange appt. Are transportation arrangements needed? No  If their condition worsens, is the pt aware to call  PCP or go to the Emergency Dept.? Yes Was the patient provided with contact information for the PCP's office or ED? Yes Was to pt encouraged to call back with questions or concerns? Yes  SDOH assessments and interventions completed:   Yes SDOH Interventions Today    Flowsheet Row Most Recent Value  SDOH Interventions   Food Insecurity Interventions Intervention Not Indicated  Transportation Interventions Intervention Not Indicated       Care Coordination Interventions:  Education provided    Encounter Outcome:  Pt. Visit Completed     Enzo Montgomery, RN,BSN,CCM [facility] Telephonic Care Management Coordinator Direct Phone: [phone] Toll Free: [phone] Fax: [phone]

## 2022-07-21 NOTE — Patient Instructions (Signed)
Follow up in 6 months and sooner for any dizziness or syncope (passing out)

## 2022-07-23 ENCOUNTER — Ambulatory Visit: Payer: Medicare Other | Admitting: Internal Medicine

## 2022-07-31 ENCOUNTER — Ambulatory Visit: Payer: Medicare Other | Attending: Internal Medicine | Admitting: Internal Medicine

## 2022-07-31 ENCOUNTER — Encounter: Payer: Self-pay | Admitting: Internal Medicine

## 2022-07-31 VITALS — BP 155/68 | HR 50 | Ht 72.0 in | Wt 179.0 lb

## 2022-07-31 DIAGNOSIS — I2510 Atherosclerotic heart disease of native coronary artery without angina pectoris: Secondary | ICD-10-CM | POA: Diagnosis not present

## 2022-07-31 DIAGNOSIS — I350 Nonrheumatic aortic (valve) stenosis: Secondary | ICD-10-CM

## 2022-07-31 MED ORDER — APIXABAN 2.5 MG PO TABS: 2.5000 mg | ORAL_TABLET | Freq: Two times a day (BID) | ORAL | 11 refills | Status: DC

## 2022-07-31 NOTE — Progress Notes (Signed)
Cardiology Office Note  Date: 07/31/2022   ID: Randall Lara, DOB 01-15-1930, MRN [id number]  PCP:  [name], MD  Cardiologist:  None Electrophysiologist:  None   Reason for Office Visit: Posthospitalization follow-up   History of Present Illness: Randall Lara is a 89 y.o. male known to have permanent A-fib with slow point response and frequent PVCs on [facility] (not on rate controlling agents and not PPM candidate per EP), severe aortic stenosis (refused TAVR), critical CAD (ostial to proximal 70% LAD stenosis in 09/2021 on medical management), dementia presented to the cardiology clinic for follow-up visit. Accompanied by niece.  Patient denies any symptoms of chest pain, DOE, palpitations, dizziness, lightness, syncope, leg swelling. He is compliant with his medications and has no side effects.  He is physically active at baseline and takes care of his wife at home.  His niece helps him with medications.  Past Medical History:  Diagnosis Date   Anxiety    Arthritis    Atrial fibrillation (HCC)    Depression    Dysrhythmia    atrial fib   GERD (gastroesophageal reflux disease)    no meds needed- diet controlled   Heart palpitations    Hypertension    Joint pain    Obesity     Past Surgical History:  Procedure Laterality Date   CATARACT EXTRACTION Left    CATARACT EXTRACTION W/PHACO Right 03/05/2016   Procedure: CATARACT EXTRACTION PHACO AND INTRAOCULAR LENS PLACEMENT ; CDE:  11.47;  Surgeon: Tonny Branch, MD;  Location: [facility];  Service: Ophthalmology;  Laterality: Right;   ESOPHAGOGASTRODUODENOSCOPY  03/29/2002   [facility]:XTAVWP esophagus, stomach, and duodenum through the second  portion.   hernia repair Bilateral    Inguinal   RIGHT/LEFT HEART CATH AND CORONARY ANGIOGRAPHY N/A 09/19/2021   Procedure: RIGHT/LEFT HEART CATH AND CORONARY ANGIOGRAPHY;  Surgeon: Burnell Blanks, MD;  Location: [facility];  Service: Cardiovascular;  Laterality: N/A;   TOTAL KNEE  ARTHROPLASTY Right 11/13/2015   Procedure: TOTAL KNEE ARTHROPLASTY;  Surgeon: Latanya Maudlin, MD;  Location: [facility];  Service: Orthopedics;  Laterality: Right;    Current Outpatient Medications  Medication Sig Dispense Refill   ALPRAZolam (XANAX) 1 MG tablet TAKE ONE TABLET BY MOUTH THREE TIMES DAILY 90 tablet 5   apixaban (ELIQUIS) 2.5 MG TABS tablet Take 1 tablet (2.5 mg total) by mouth 2 (two) times daily. 60 tablet 11   ARIPiprazole (ABILIFY) 2 MG tablet Take 1 tablet (2 mg total) by mouth daily. 90 tablet 3   buPROPion (WELLBUTRIN XL) 300 MG 24 hr tablet Take 1 tablet (300 mg total) by mouth daily. 90 tablet 1   mirtazapine (REMERON SOL-TAB) 30 MG disintegrating tablet DISSOLVE ONE TABLET BY MOUTH AT BEDTIME 90 tablet 1   nitroGLYCERIN (NITROSTAT) 0.4 MG SL tablet DISSOLVE ONE TABLET UNDER TONGUE EVERY 5 MINUTES UP TO 3 DOSES AS NEEDED FOR CHEST PAIN. DO NOT EXCEED A TOTAL OF 3 DOSES IN 15 MINUTES. 25 tablet 0   No current facility-administered medications for this visit.   Allergies:  Ferrous sulfate and Sulfa antibiotics   Social History: The patient  reports that he quit smoking about 47 years ago. His smoking use included cigarettes. He started smoking about 67 years ago. He has a 20.00 pack-year smoking history. He has never used smokeless tobacco. He reports that he does not currently use alcohol. He reports that he does not use drugs.   Family History: The patient's family history  includes Heart attack in his brother; Hypertension in his mother and sister; Stroke in his father, mother, and sister.   ROS:  Please see the history of present illness. Otherwise, complete review of systems is positive for none.  All other systems are reviewed and negative.   Physical Exam: VS:  BP (!) 155/68 (BP Location: Left Arm, Patient Position: Sitting, Cuff Size: Normal)   Pulse (!) 50   Ht 6' (1.829 m)   Wt 179 lb (81.2 kg)   SpO2 97%   BMI 24.28 kg/m , BMI Body mass index is 24.28  kg/m.  Wt Readings from Last 3 Encounters:  07/31/22 179 lb (81.2 kg)  07/21/22 193 lb 4.8 oz (87.7 kg)  07/17/22 192 lb (87.1 kg)    General: Patient appears comfortable at rest. HEENT: Conjunctiva and lids normal, oropharynx clear with moist mucosa. Neck: Supple, no elevated JVP or carotid bruits, no thyromegaly. Lungs: Clear to auscultation, nonlabored breathing at rest. Cardiac: Regular rate and rhythm, no S3 or significant systolic murmur, no pericardial rub. Abdomen: Soft, nontender, no hepatomegaly, bowel sounds present, no guarding or rebound. Extremities: No pitting edema, distal pulses 2+. Skin: Warm and dry. Musculoskeletal: No kyphosis. Neuropsychiatric: Alert and oriented x3, affect grossly appropriate.  ECG:  An ECG dated 07/20/2022 was personally reviewed today and demonstrated:  Afib with frequent PVCs  Recent Labwork: 07/18/2022: ALT 16; AST 29; TSH 3.604 07/19/2022: Hemoglobin 11.7; Platelets 94 07/20/2022: BUN 24; Creatinine, Ser 1.52; Magnesium 2.1; Potassium 4.1; Sodium 139  No results found for: "CHOL", "TRIG", "HDL", "CHOLHDL", "VLDL", "LDLCALC", "LDLDIRECT"  Other Studies Reviewed Today:   Assessment and Plan: Patient is a 89 year old M known to have permanent A-fib with slow ventricular response and frequent PVCs on AC (not on rate controlling agents and not PPM candidate per EP), severe aortic stenosis (refused TAVR), critical CAD (ostial to proximal 70% LAD stenosis in 09/2021 on medical management), dementia presented to the cardiology clinic for follow-up visit. Accompanied by niece.  # Permanent A-fib with slow ventricular response and frequent PVCs -Not on any rate controlling agents due to slow ventricular response -Not a pacemaker candidate per EP -Decrease dose of Eliquis from 5 mg to 2.5 mg twice daily due to serum creatinine more than 1.5 and age more than 87 years. Not at increased risk of falls per her niece.  # Severe aortic valve stenosis,  asymptomatic -Refused TAVR -Conservative management  # Critical CAD -Not on baby aspirin due to Eliquis use  I have spent a total of 33 minutes with patient reviewing chart, EKGs, labs and examining patient as well as establishing an assessment and plan that was discussed with the patient.  > 50% of time was spent in direct patient care.     Medication Adjustments/Labs and Tests Ordered: Current medicines are reviewed at length with the patient today.  Concerns regarding medicines are outlined above.   Tests Ordered: No orders of the defined types were placed in this encounter.   Medication Changes: Meds ordered this encounter  Medications   apixaban (ELIQUIS) 2.5 MG TABS tablet    Sig: Take 1 tablet (2.5 mg total) by mouth 2 (two) times daily.    Dispense:  60 tablet    Refill:  11    Disposition:  Follow up  6 months  Signed, Randall Isakson Fidel Levy, MD, 07/31/2022 3:34 PM    [facility] at [facility] [address], [place], [place] [address]

## 2022-07-31 NOTE — Patient Instructions (Signed)
Medication Instructions:  Your physician has recommended you make the following change in your medication:   Decrease Eliquis to 2.5 mg Two Times Daily   *If you need a refill on your cardiac medications before your next appointment, please call your pharmacy*   Lab Work: NONE   If you have labs (blood work) drawn today and your tests are completely normal, you will receive your results only by: [place] (if you have MyChart) OR A paper copy in the mail If you have any lab test that is abnormal or we need to change your treatment, we will call you to review the results.   Testing/Procedures: NONE    Follow-Up: At [facility], you and your health needs are our priority.  As part of our continuing mission to provide you with exceptional heart care, we have created designated Provider Care Teams.  These Care Teams include your primary Cardiologist (physician) and Advanced Practice Providers (APPs -  Physician Assistants and Nurse Practitioners) who all work together to provide you with the care you need, when you need it.  We recommend signing up for the patient portal called "MyChart".  Sign up information is provided on this After Visit Summary.  MyChart is used to connect with patients for Virtual Visits (Telemedicine).  Patients are able to view lab/test results, encounter notes, upcoming appointments, etc.  Non-urgent messages can be sent to your provider as well.   To learn more about what you can do with MyChart, go to [email or web address].    Your next appointment:   6 month(s)  Provider:   Claudina Lick, MD    Other Instructions Thank you for choosing [facility]!

## 2022-08-25 ENCOUNTER — Encounter: Payer: Self-pay | Admitting: Family Medicine

## 2022-08-25 ENCOUNTER — Ambulatory Visit (INDEPENDENT_AMBULATORY_CARE_PROVIDER_SITE_OTHER): Payer: Medicare Other | Admitting: Family Medicine

## 2022-08-25 VITALS — BP 132/62 | HR 55 | Temp 97.4°F | Ht 72.0 in | Wt 193.7 lb

## 2022-08-25 DIAGNOSIS — I350 Nonrheumatic aortic (valve) stenosis: Secondary | ICD-10-CM | POA: Diagnosis not present

## 2022-08-25 DIAGNOSIS — M25561 Pain in right knee: Secondary | ICD-10-CM

## 2022-08-25 DIAGNOSIS — I10 Essential (primary) hypertension: Secondary | ICD-10-CM

## 2022-08-25 DIAGNOSIS — Z8679 Personal history of other diseases of the circulatory system: Secondary | ICD-10-CM | POA: Diagnosis not present

## 2022-08-25 DIAGNOSIS — F411 Generalized anxiety disorder: Secondary | ICD-10-CM | POA: Diagnosis not present

## 2022-08-25 DIAGNOSIS — F339 Major depressive disorder, recurrent, unspecified: Secondary | ICD-10-CM | POA: Diagnosis not present

## 2022-08-25 MED ORDER — MIRTAZAPINE 30 MG PO TBDP: ORAL_TABLET | ORAL | 1 refills | Status: DC

## 2022-08-25 MED ORDER — BUPROPION HCL ER (XL) 300 MG PO TB24: 300.0000 mg | ORAL_TABLET | Freq: Every day | ORAL | 1 refills | Status: DC

## 2022-08-25 NOTE — Progress Notes (Signed)
Established Patient Office Visit  Subjective   Patient ID: Randall Lara, male    DOB: July 18, 1929  Age: 89 y.o. MRN: [id number]  Chief Complaint  Patient presents with   Knee Pain    Patient complains of right knee pain, x3 days,     Fall    Patient complains of fall. X3 days     HPI   Randall Lara is here to discuss several things as follows.  He states the past few years been very stressful taking care of his wife with advanced dementia.  Randall Lara has himself had some significant cognitive decline.  He no longer drives.  He has a niece that lives nearby that helps to oversee medications.  He states Saturday night he went to sit down and did not sit back far enough and fell to the ground.  He thinks he may have struck his knee.  He had total knee replacement previously.  Able to bear weight since then.  He felt like he may have seen little bit of warmth.  No bruising.  No other injuries reported.  No head injury.  No loss of consciousness.  He is requesting refills of antidepression medications including mirtazapine and Wellbutrin.  He has had longstanding history of chronic anxiety and recurrent depression.  He feels like his current combination is working fairly well.  He has history of CAD, hypertension, severe aortic stenosis, bradycardia.  He decided against any surgery for aortic valve stenosis.  If seems confused about medications.  He brings in a bottle of benazepril HCTZ but this is not on his med list.  His Eliquis was recently reduced to 2.5 mg because of age and GFR.  Past Medical History:  Diagnosis Date   Anxiety    Arthritis    Atrial fibrillation (HCC)    Depression    Dysrhythmia    atrial fib   GERD (gastroesophageal reflux disease)    no meds needed- diet controlled   Heart palpitations    Hypertension    Joint pain    Obesity    Past Surgical History:  Procedure Laterality Date   CATARACT EXTRACTION Left    CATARACT EXTRACTION W/PHACO Right 03/05/2016    Procedure: CATARACT EXTRACTION PHACO AND INTRAOCULAR LENS PLACEMENT ; CDE:  11.47;  Surgeon: Tonny Branch, MD;  Location: [facility];  Service: Ophthalmology;  Laterality: Right;   ESOPHAGOGASTRODUODENOSCOPY  03/29/2002   [id number] esophagus, stomach, and duodenum through the second  portion.   hernia repair Bilateral    Inguinal   RIGHT/LEFT HEART CATH AND CORONARY ANGIOGRAPHY N/A 09/19/2021   Procedure: RIGHT/LEFT HEART CATH AND CORONARY ANGIOGRAPHY;  Surgeon: Burnell Blanks, MD;  Location: [facility];  Service: Cardiovascular;  Laterality: N/A;   TOTAL KNEE ARTHROPLASTY Right 11/13/2015   Procedure: TOTAL KNEE ARTHROPLASTY;  Surgeon: Latanya Maudlin, MD;  Location: [facility];  Service: Orthopedics;  Laterality: Right;    reports that he quit smoking about 47 years ago. His smoking use included cigarettes. He started smoking about 67 years ago. He has a 20.00 pack-year smoking history. He has never used smokeless tobacco. He reports that he does not currently use alcohol. He reports that he does not use drugs. family history includes Heart attack in his brother; Hypertension in his mother and sister; Stroke in his father, mother, and sister. Allergies  Allergen Reactions   Ferrous Sulfate Hives and Swelling    Patient doesn't recall.   Sulfa Antibiotics Hives and Swelling  Patient doesn't recall.     Review of Systems  Constitutional:  Negative for chills, fever and malaise/fatigue.  Eyes:  Negative for blurred vision.  Respiratory:  Negative for shortness of breath.   Cardiovascular:  Negative for chest pain.  Gastrointestinal:  Negative for abdominal pain.  Neurological:  Negative for dizziness, weakness and headaches.      Objective:     BP 132/62 (BP Location: Left Arm, Patient Position: Sitting, Cuff Size: Normal)   Pulse (!) 55   Temp (!) 97.4 F (36.3 C) (Oral)   Ht 6' (1.829 m)   Wt 193 lb 11.2 oz (87.9 kg)   SpO2 98%   BMI 26.27 kg/m    Physical  Exam Vitals reviewed.  Cardiovascular:     Comments: Mild bradycardia with heart rate around 55 and irregular Musculoskeletal:     Right lower leg: No edema.     Left lower leg: No edema.     Comments: Right knee reveals no effusion.  Scar from previous surgery.  No bony tenderness.  No ecchymosis.  No effusion.  Full range of motion.  Neurological:     General: No focal deficit present.  Psychiatric:     Comments: Oriented to day of week and place but was not oriented to year or date.      No results found for any visits on 08/25/22.    The ASCVD Risk score (Arnett DK, et al., 2019) failed to calculate for the following reasons:   The 2019 ASCVD risk score is only valid for ages 41 to 30    Assessment & Plan:   #1 right knee pain.  Reported fall several days ago.  No bony tenderness.  Ambulating well with no difficulties with weightbearing.  No indication for x-ray at this time.  Recommend continued observation  #2 chronic atrial fibrillation.  Rate controlled.  Patient on Eliquis with niece overseeing medications  #3 severe aortic stenosis.  Patient has decided against any surgery.  Denies any severe dyspnea with daily activities and no recent syncope  #4 hypertension.  Currently stable.  Needs medication oversight and will encourage continued oversight with his niece  #5 history of recurrent depression.  Currently stable.  Refill mirtazapine and Wellbutrin for 6 months  Carolann Littler, MD

## 2022-09-08 ENCOUNTER — Telehealth: Payer: Self-pay | Admitting: Family Medicine

## 2022-09-08 DIAGNOSIS — Z789 Other specified health status: Secondary | ICD-10-CM

## 2022-09-08 DIAGNOSIS — M545 Low back pain, unspecified: Secondary | ICD-10-CM

## 2022-09-08 NOTE — Telephone Encounter (Signed)
Pt niece call and stated pt want a commode chair and a walker pt want a call back.

## 2022-09-09 NOTE — Telephone Encounter (Signed)
Order placed and left message for the patient to return my call.

## 2022-09-14 ENCOUNTER — Telehealth: Payer: Self-pay | Admitting: Internal Medicine

## 2022-09-14 ENCOUNTER — Ambulatory Visit: Payer: Medicare Other | Admitting: Internal Medicine

## 2022-09-14 NOTE — Telephone Encounter (Signed)
Spoke with [place] niece, who works for [employer] to review medications.  Per [name], appointment for today was originally schedule due to patient having problems with A-fib. Per [name], patient says he no longer has a-fib symptoms and he feels fine. Per [name], patient says he no longer need today's appointment. Advised to call office back if appointment need to be rescheduled otherwise follow up in July 2024 as planned.

## 2022-09-14 NOTE — Telephone Encounter (Signed)
Left a message for the patient to return my call.  

## 2022-09-14 NOTE — Telephone Encounter (Signed)
Called patient in regards to Dr. Dellia Cloud message. Patient was seen recently and she advised to see him back in 6 months. Mr. Randall Lara said that he had been experiencing some episodes of A Fib. That was the main reason for calling to schedule the appointment. He wanted to know if there were any medications he should be taking for A. Fib.

## 2022-09-14 NOTE — Telephone Encounter (Signed)
Left message for niece [name] who handles medications for patient, to call office Also, need DPR updated in chart

## 2022-09-15 ENCOUNTER — Telehealth: Payer: Self-pay | Admitting: Family Medicine

## 2022-09-15 DIAGNOSIS — Z789 Other specified health status: Secondary | ICD-10-CM

## 2022-09-15 DIAGNOSIS — M545 Low back pain, unspecified: Secondary | ICD-10-CM

## 2022-09-15 NOTE — Telephone Encounter (Signed)
I spoke with the patient and DME orders have been faxed to [facility]

## 2022-09-15 NOTE — Telephone Encounter (Signed)
Patient asking for a paper prescription to take to DME store. Patient asking that the prescription be mailed, Again requests that you give [name] a call f

## 2022-09-15 NOTE — Telephone Encounter (Signed)
Please see most recent encounter

## 2022-09-15 NOTE — Telephone Encounter (Signed)
Pt called to request an attachment with arms to put on the commode, that will prevent him from falling off.  Pt is asking MD / CMA call his niece [name] at [phone], for clarification.  Pt states he needs this as soon as possible.   Please advise.

## 2022-09-15 NOTE — Telephone Encounter (Signed)
Patient calling in to confirm that message was sent with request to speak with his niece [name] regarding this request.

## 2022-10-01 ENCOUNTER — Other Ambulatory Visit: Payer: Self-pay | Admitting: Family Medicine

## 2022-10-17 DIAGNOSIS — I469 Cardiac arrest, cause unspecified: Secondary | ICD-10-CM | POA: Diagnosis not present

## 2022-10-17 DIAGNOSIS — Z743 Need for continuous supervision: Secondary | ICD-10-CM | POA: Diagnosis not present

## 2022-10-17 DIAGNOSIS — R404 Transient alteration of awareness: Secondary | ICD-10-CM | POA: Diagnosis not present

## 2022-10-17 DIAGNOSIS — R58 Hemorrhage, not elsewhere classified: Secondary | ICD-10-CM | POA: Diagnosis not present

## 2022-10-17 DIAGNOSIS — I499 Cardiac arrhythmia, unspecified: Secondary | ICD-10-CM | POA: Diagnosis not present

## 2022-11-03 ENCOUNTER — Telehealth: Payer: Self-pay | Admitting: Family Medicine

## 2022-11-03 NOTE — Telephone Encounter (Signed)
I spoke with the patient's niece and she reports they assume he passed due to heart issues/ Heart attack.

## 2022-11-03 NOTE — Telephone Encounter (Signed)
Death cert sent via [facility] system [id number]. Says patient passed on 2022-11-06 and they are still awaiting a signature.

## 2022-11-11 DEATH — deceased

## 2023-02-08 ENCOUNTER — Ambulatory Visit: Payer: Medicare Other | Admitting: Internal Medicine

## 2023-03-14 IMAGING — MR MR HEAD W/O CM
11 of 12 series · 42 of 48 positions shown · non-contrast
Comparison: None.

CLINICAL DATA: Neuro deficit with acute stroke suspected

EXAM:
MRI HEAD WITHOUT CONTRAST
TECHNIQUE: Multiplanar, multiecho pulse sequences of the brain and surrounding
structures were obtained without intravenous contrast.

[Series 5: DWI · axial · 4.0mm · 0.88mm/px · z∈[-65,+79]mm · 4 of 37 slices shown (1 of 6)]
[im 1/37]
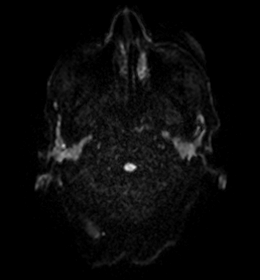
[im 13/37]
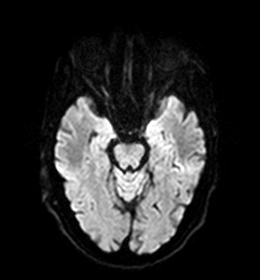
[im 25/37]
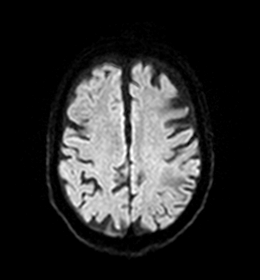
[im 37/37]
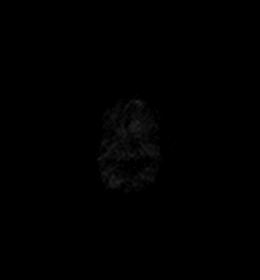

[Series 5: DWI · axial · 4.0mm · 0.88mm/px · z∈[-65,+79]mm · 4 of 37 slices shown (2 of 6)]
[im 1/37]
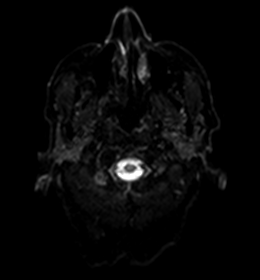
[im 13/37]
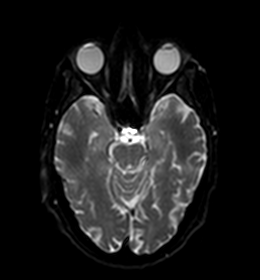
[im 25/37]
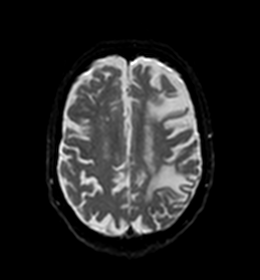
[im 37/37]
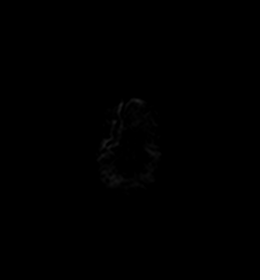

[Series 6: DWI · axial · 4.0mm · 0.88mm/px · z∈[-65,+79]mm · 4 of 37 slices shown (3 of 6)]
[im 1/37]
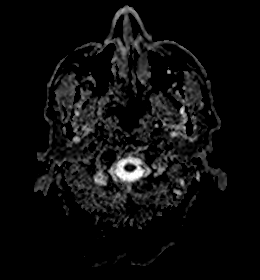
[im 13/37]
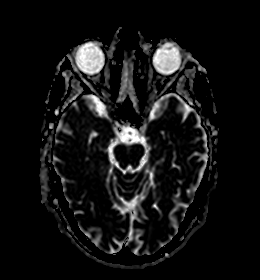
[im 25/37]
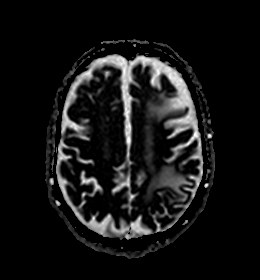
[im 37/37]
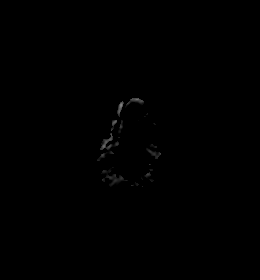

[Series 7: DWI · coronal · 5.0mm · 0.88mm/px · 4 of 30 slices shown (4 of 6)]
[im 1/30]
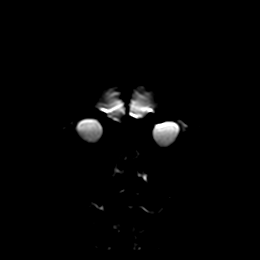
[im 10/30]
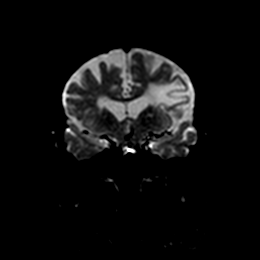
[im 20/30]
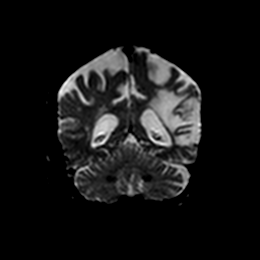
[im 30/30]
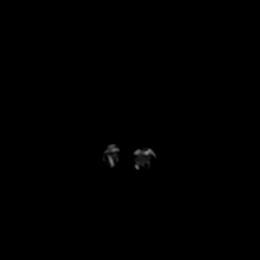

[Series 7: DWI · coronal · 5.0mm · 0.88mm/px · 4 of 30 slices shown (5 of 6)]
[im 1/30]
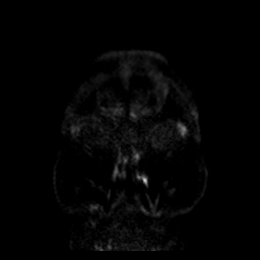
[im 10/30]
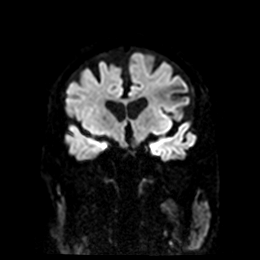
[im 20/30]
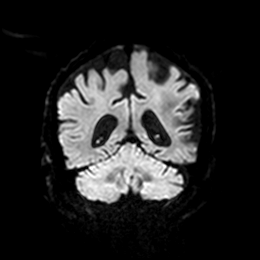
[im 30/30]
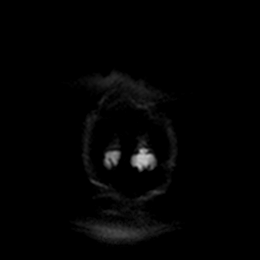

[Series 8: DWI · coronal · 5.0mm · 0.88mm/px · 4 of 30 slices shown (6 of 6)]
[im 1/30]
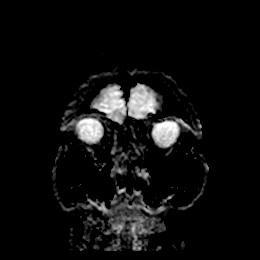
[im 10/30]
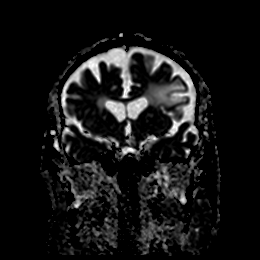
[im 20/30]
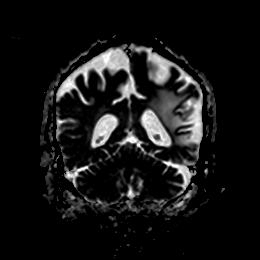
[im 30/30]
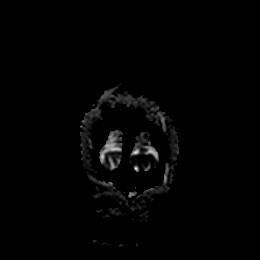

[Series 9: T1 · sagittal · 5.0mm · 0.94mm/px · 3 of 21 slices shown]
[im 1/21]
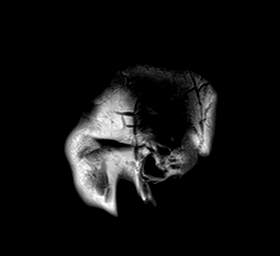
[im 11/21]
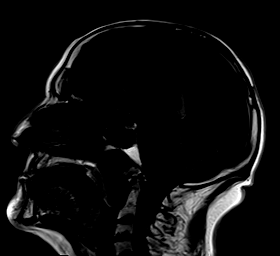
[im 21/21]
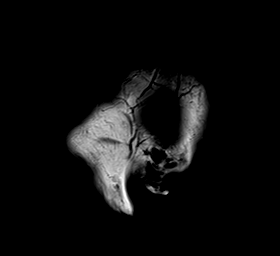

[Series 10: T2 · axial · 5.0mm · 0.72mm/px · z∈[-59,+74]mm · 3 of 20 slices shown (1 of 2)]
[im 1/20]
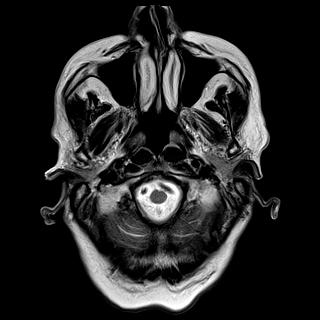
[im 10/20]
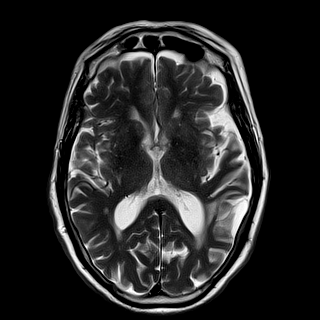
[im 20/20]
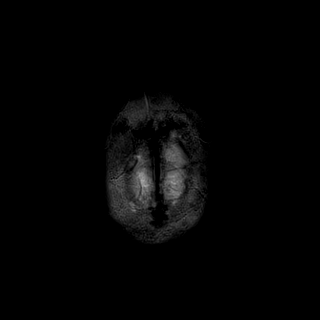

[Series 11: ax hemo · axial · 5.0mm · 0.86mm/px · z∈[-63,+80]mm · 3 of 25 slices shown]
[im 1/25]
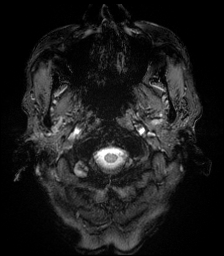
[im 13/25]
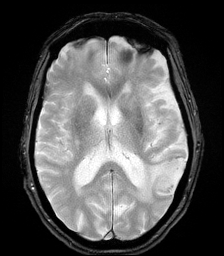
[im 25/25]
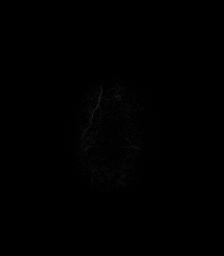

[Series 12: FLAIR · axial · 4.0mm · 0.43mm/px · z∈[-61,+78]mm · 5 of 36 slices shown]
[im 1/36]
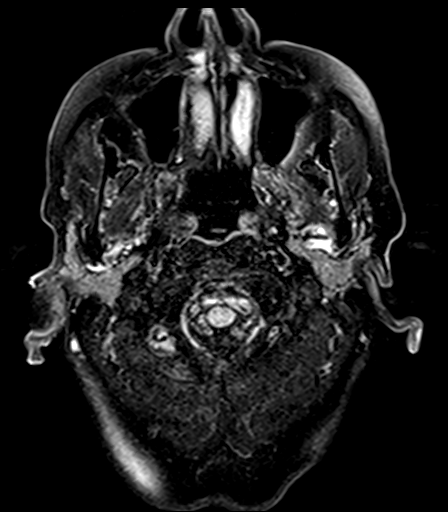
[im 9/36]
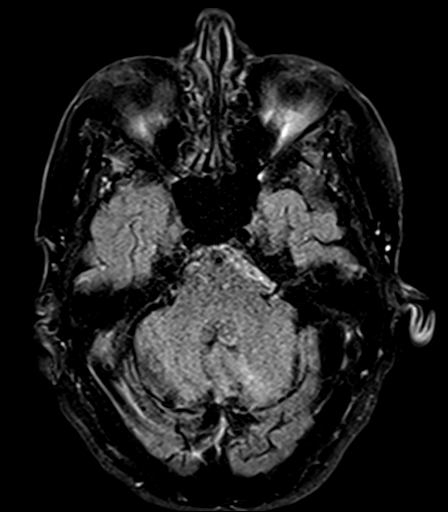
[im 18/36]
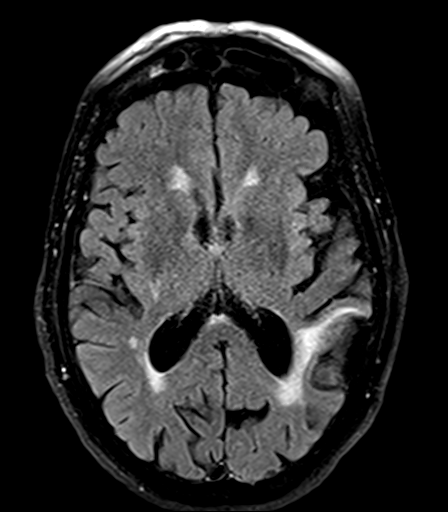
[im 27/36]
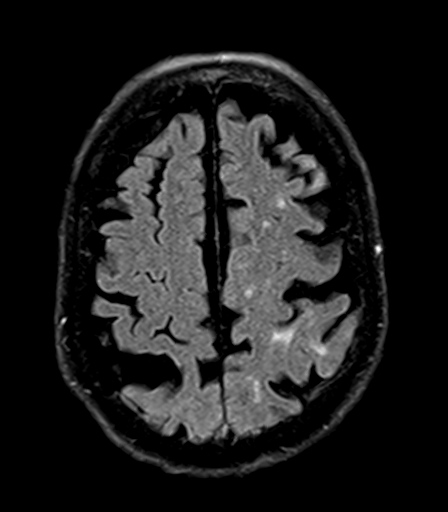
[im 36/36]
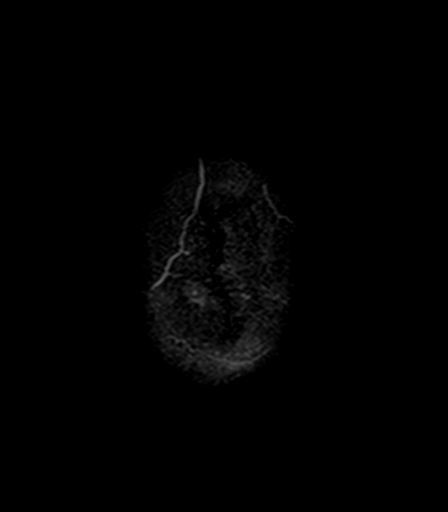

[Series 14: T2 · coronal · 5.0mm · 0.72mm/px · 4 of 30 slices shown (2 of 2)]
[im 1/30]
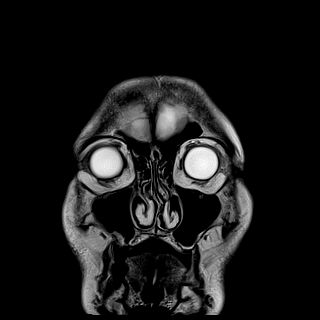
[im 10/30]
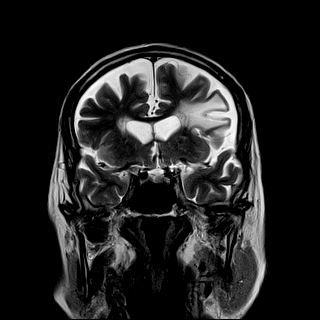
[im 20/30]
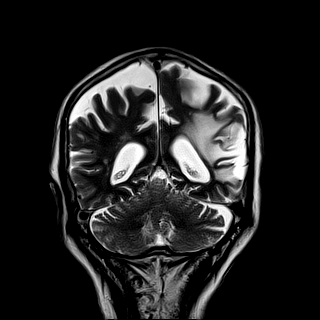
[im 30/30]
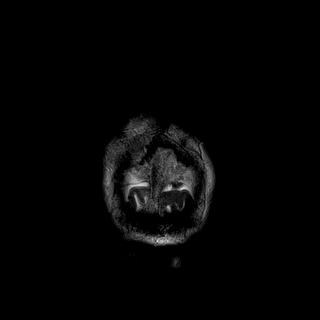

[42 of 48 positions shown; findings below may reference images not displayed]

FINDINGS: Brain: No acute infarction, hemorrhage, hydrocephalus, extra-axial
collection or mass lesion. Moderate size remote left MCA branch
infarcts in the frontal and frontal parietal regions. Brain atrophy
especially notable at the vertex. Confluent chronic small vessel
ischemia in the hemispheric white matter. Small remote right
cerebellar infarct.

Vascular: Right V4 segment loss of flow void, although reconstituted
by the distal V4 segment and right PICA based on coronal T2 weighted
imaging.

Skull and upper cervical spine: Normal marrow signal.

Sinuses/Orbits: Bilateral cataract resection
IMPRESSION: 1. No acute finding.
2. Chronic small vessel disease, brain atrophy, and remote left MCA
branch infarcts.
